# Patient Record
Sex: Female | Born: 1965 | Race: White | Hispanic: No | Marital: Married | Smoking: Current every day smoker
Health system: Southern US, Community
[De-identification: ages and names within clinical notes are randomized; demographics above are authoritative.]

## PROBLEM LIST (undated history)

## (undated) DIAGNOSIS — K7460 Unspecified cirrhosis of liver: Secondary | ICD-10-CM

## (undated) DIAGNOSIS — D649 Anemia, unspecified: Secondary | ICD-10-CM

## (undated) DIAGNOSIS — R51 Headache: Secondary | ICD-10-CM

## (undated) DIAGNOSIS — R569 Unspecified convulsions: Secondary | ICD-10-CM

## (undated) DIAGNOSIS — G40309 Generalized idiopathic epilepsy and epileptic syndromes, not intractable, without status epilepticus: Secondary | ICD-10-CM

## (undated) DIAGNOSIS — E119 Type 2 diabetes mellitus without complications: Secondary | ICD-10-CM

## (undated) DIAGNOSIS — J209 Acute bronchitis, unspecified: Secondary | ICD-10-CM

## (undated) DIAGNOSIS — E663 Overweight: Secondary | ICD-10-CM

## (undated) DIAGNOSIS — K219 Gastro-esophageal reflux disease without esophagitis: Secondary | ICD-10-CM

## (undated) DIAGNOSIS — R933 Abnormal findings on diagnostic imaging of other parts of digestive tract: Secondary | ICD-10-CM

## (undated) DIAGNOSIS — R740 Nonspecific elevation of levels of transaminase and lactic acid dehydrogenase [LDH]: Secondary | ICD-10-CM

## (undated) DIAGNOSIS — I10 Essential (primary) hypertension: Secondary | ICD-10-CM

## (undated) DIAGNOSIS — D61818 Other pancytopenia: Secondary | ICD-10-CM

## (undated) DIAGNOSIS — F331 Major depressive disorder, recurrent, moderate: Secondary | ICD-10-CM

## (undated) DIAGNOSIS — K449 Diaphragmatic hernia without obstruction or gangrene: Secondary | ICD-10-CM

## (undated) DIAGNOSIS — M545 Low back pain: Secondary | ICD-10-CM

## (undated) DIAGNOSIS — D696 Thrombocytopenia, unspecified: Principal | ICD-10-CM

## (undated) HISTORY — DX: Type 2 diabetes mellitus without complications: E11.9

## (undated) HISTORY — DX: Unspecified convulsions: R56.9

## (undated) HISTORY — DX: Gastro-esophageal reflux disease without esophagitis: K21.9

## (undated) HISTORY — DX: Overweight: E66.3

## (undated) HISTORY — DX: Other pancytopenia: D61.818

## (undated) HISTORY — DX: Nonspecific elevation of levels of transaminase and lactic acid dehydrogenase (ldh): R74.0

## (undated) HISTORY — PX: LUMBAR LAMINECTOMY: SHX95

## (undated) HISTORY — DX: Unspecified cirrhosis of liver: K74.60

## (undated) HISTORY — DX: Thrombocytopenia, unspecified: D69.6

## (undated) HISTORY — DX: Abnormal findings on diagnostic imaging of other parts of digestive tract: R93.3

## (undated) HISTORY — DX: Low back pain: M54.5

## (undated) HISTORY — DX: Headache: R51

## (undated) HISTORY — DX: Essential (primary) hypertension: I10

## (undated) HISTORY — PX: TUBAL LIGATION: SHX77

## (undated) HISTORY — DX: Diaphragmatic hernia without obstruction or gangrene: K44.9

## (undated) HISTORY — DX: Anemia, unspecified: D64.9

## (undated) HISTORY — DX: Generalized idiopathic epilepsy and epileptic syndromes, not intractable, without status epilepticus: G40.309

## (undated) HISTORY — PX: CARPAL TUNNEL RELEASE: SHX101

## (undated) HISTORY — DX: Acute bronchitis, unspecified: J20.9

## (undated) HISTORY — DX: Major depressive disorder, recurrent, moderate: F33.1

## (undated) SURGERY — EGD (ESOPHAGOGASTRODUODENOSCOPY)
Anesthesia: Moderate Sedation | Laterality: Left

---

## 1998-08-26 ENCOUNTER — Emergency Department (HOSPITAL_COMMUNITY): Admission: EM | Admit: 1998-08-26 | Discharge: 1998-08-26 | Payer: Self-pay | Admitting: Emergency Medicine

## 1999-02-13 ENCOUNTER — Other Ambulatory Visit: Admission: RE | Admit: 1999-02-13 | Discharge: 1999-02-13 | Payer: Self-pay | Admitting: Obstetrics and Gynecology

## 2001-02-03 ENCOUNTER — Emergency Department (HOSPITAL_COMMUNITY): Admission: EM | Admit: 2001-02-03 | Discharge: 2001-02-03 | Payer: Self-pay | Admitting: *Deleted

## 2001-05-10 ENCOUNTER — Emergency Department (HOSPITAL_COMMUNITY): Admission: EM | Admit: 2001-05-10 | Discharge: 2001-05-10 | Payer: Self-pay | Admitting: *Deleted

## 2001-05-10 ENCOUNTER — Encounter: Payer: Self-pay | Admitting: *Deleted

## 2001-07-23 ENCOUNTER — Emergency Department (HOSPITAL_COMMUNITY): Admission: EM | Admit: 2001-07-23 | Discharge: 2001-07-23 | Payer: Self-pay | Admitting: *Deleted

## 2001-08-25 ENCOUNTER — Emergency Department (HOSPITAL_COMMUNITY): Admission: EM | Admit: 2001-08-25 | Discharge: 2001-08-25 | Payer: Self-pay | Admitting: *Deleted

## 2001-08-25 ENCOUNTER — Encounter: Payer: Self-pay | Admitting: *Deleted

## 2001-10-05 ENCOUNTER — Emergency Department (HOSPITAL_COMMUNITY): Admission: EM | Admit: 2001-10-05 | Discharge: 2001-10-05 | Payer: Self-pay | Admitting: Emergency Medicine

## 2001-10-09 ENCOUNTER — Emergency Department (HOSPITAL_COMMUNITY): Admission: EM | Admit: 2001-10-09 | Discharge: 2001-10-09 | Payer: Self-pay | Admitting: *Deleted

## 2002-03-18 ENCOUNTER — Emergency Department (HOSPITAL_COMMUNITY): Admission: EM | Admit: 2002-03-18 | Discharge: 2002-03-18 | Payer: Self-pay | Admitting: Emergency Medicine

## 2002-03-23 ENCOUNTER — Emergency Department (HOSPITAL_COMMUNITY): Admission: EM | Admit: 2002-03-23 | Discharge: 2002-03-23 | Payer: Self-pay | Admitting: Emergency Medicine

## 2002-06-19 ENCOUNTER — Emergency Department (HOSPITAL_COMMUNITY): Admission: EM | Admit: 2002-06-19 | Discharge: 2002-06-19 | Payer: Self-pay | Admitting: Emergency Medicine

## 2002-12-06 ENCOUNTER — Emergency Department (HOSPITAL_COMMUNITY): Admission: EM | Admit: 2002-12-06 | Discharge: 2002-12-06 | Payer: Self-pay | Admitting: Emergency Medicine

## 2003-08-16 ENCOUNTER — Emergency Department (HOSPITAL_COMMUNITY): Admission: EM | Admit: 2003-08-16 | Discharge: 2003-08-16 | Payer: Self-pay | Admitting: Emergency Medicine

## 2003-08-23 ENCOUNTER — Emergency Department (HOSPITAL_COMMUNITY): Admission: EM | Admit: 2003-08-23 | Discharge: 2003-08-23 | Payer: Self-pay | Admitting: Emergency Medicine

## 2003-09-06 ENCOUNTER — Emergency Department (HOSPITAL_COMMUNITY): Admission: AD | Admit: 2003-09-06 | Discharge: 2003-09-06 | Payer: Self-pay | Admitting: Family Medicine

## 2003-09-15 ENCOUNTER — Emergency Department (HOSPITAL_COMMUNITY): Admission: EM | Admit: 2003-09-15 | Discharge: 2003-09-15 | Payer: Self-pay | Admitting: Emergency Medicine

## 2003-09-30 ENCOUNTER — Emergency Department (HOSPITAL_COMMUNITY): Admission: AD | Admit: 2003-09-30 | Discharge: 2003-09-30 | Payer: Self-pay | Admitting: Emergency Medicine

## 2004-10-09 ENCOUNTER — Emergency Department (HOSPITAL_COMMUNITY): Admission: EM | Admit: 2004-10-09 | Discharge: 2004-10-09 | Payer: Self-pay | Admitting: Family Medicine

## 2004-10-16 ENCOUNTER — Ambulatory Visit: Payer: Self-pay | Admitting: Family Medicine

## 2004-11-01 ENCOUNTER — Ambulatory Visit: Payer: Self-pay | Admitting: Family Medicine

## 2005-03-19 ENCOUNTER — Ambulatory Visit: Payer: Self-pay | Admitting: Family Medicine

## 2005-04-22 ENCOUNTER — Ambulatory Visit: Payer: Self-pay | Admitting: Family Medicine

## 2005-08-18 ENCOUNTER — Emergency Department (HOSPITAL_COMMUNITY): Admission: EM | Admit: 2005-08-18 | Discharge: 2005-08-18 | Payer: Self-pay | Admitting: Family Medicine

## 2005-09-02 ENCOUNTER — Emergency Department (HOSPITAL_COMMUNITY): Admission: EM | Admit: 2005-09-02 | Discharge: 2005-09-02 | Payer: Self-pay | Admitting: Family Medicine

## 2005-09-24 ENCOUNTER — Emergency Department (HOSPITAL_COMMUNITY): Admission: EM | Admit: 2005-09-24 | Discharge: 2005-09-24 | Payer: Self-pay | Admitting: Emergency Medicine

## 2005-10-05 ENCOUNTER — Emergency Department (HOSPITAL_COMMUNITY): Admission: EM | Admit: 2005-10-05 | Discharge: 2005-10-05 | Payer: Self-pay | Admitting: Emergency Medicine

## 2006-05-26 ENCOUNTER — Ambulatory Visit: Payer: Self-pay | Admitting: Family Medicine

## 2006-06-02 ENCOUNTER — Ambulatory Visit: Payer: Self-pay | Admitting: Family Medicine

## 2006-06-04 ENCOUNTER — Ambulatory Visit: Payer: Self-pay | Admitting: Gastroenterology

## 2006-06-16 ENCOUNTER — Emergency Department (HOSPITAL_COMMUNITY): Admission: EM | Admit: 2006-06-16 | Discharge: 2006-06-16 | Payer: Self-pay | Admitting: Family Medicine

## 2006-09-10 ENCOUNTER — Emergency Department (HOSPITAL_COMMUNITY): Admission: EM | Admit: 2006-09-10 | Discharge: 2006-09-10 | Payer: Self-pay | Admitting: Family Medicine

## 2007-01-18 ENCOUNTER — Emergency Department (HOSPITAL_COMMUNITY): Admission: EM | Admit: 2007-01-18 | Discharge: 2007-01-18 | Payer: Self-pay | Admitting: Emergency Medicine

## 2007-01-19 ENCOUNTER — Ambulatory Visit: Payer: Self-pay | Admitting: Psychiatry

## 2007-01-19 ENCOUNTER — Other Ambulatory Visit (HOSPITAL_COMMUNITY): Admission: RE | Admit: 2007-01-19 | Discharge: 2007-04-19 | Payer: Self-pay | Admitting: Psychiatry

## 2007-02-03 ENCOUNTER — Ambulatory Visit (HOSPITAL_COMMUNITY): Payer: Self-pay | Admitting: Psychiatry

## 2007-02-15 ENCOUNTER — Ambulatory Visit (HOSPITAL_COMMUNITY): Payer: Self-pay | Admitting: Psychiatry

## 2007-03-10 ENCOUNTER — Ambulatory Visit (HOSPITAL_COMMUNITY): Payer: Self-pay | Admitting: Psychiatry

## 2007-07-09 ENCOUNTER — Ambulatory Visit: Payer: Self-pay | Admitting: Family Medicine

## 2007-07-09 DIAGNOSIS — E119 Type 2 diabetes mellitus without complications: Secondary | ICD-10-CM

## 2007-07-09 DIAGNOSIS — F331 Major depressive disorder, recurrent, moderate: Secondary | ICD-10-CM

## 2007-07-09 DIAGNOSIS — I10 Essential (primary) hypertension: Secondary | ICD-10-CM

## 2007-07-09 HISTORY — DX: Essential (primary) hypertension: I10

## 2007-07-09 HISTORY — DX: Type 2 diabetes mellitus without complications: E11.9

## 2007-07-09 HISTORY — DX: Major depressive disorder, recurrent, moderate: F33.1

## 2007-07-15 LAB — CONVERTED CEMR LAB
ALT: 56 units/L — ABNORMAL HIGH (ref 0–35)
Alkaline Phosphatase: 159 units/L — ABNORMAL HIGH (ref 39–117)
BUN: 4 mg/dL — ABNORMAL LOW (ref 6–23)
Basophils Relative: 0.9 % (ref 0.0–1.0)
CO2: 27 meq/L (ref 19–32)
Calcium: 9 mg/dL (ref 8.4–10.5)
Cholesterol: 220 mg/dL (ref 0–200)
Creatinine, Ser: 0.5 mg/dL (ref 0.4–1.2)
Hemoglobin: 13.9 g/dL (ref 12.0–15.0)
Monocytes Absolute: 0.5 10*3/uL (ref 0.2–0.7)
Monocytes Relative: 6.2 % (ref 3.0–11.0)
Platelets: 115 10*3/uL — ABNORMAL LOW (ref 150–400)
RDW: 14.7 % — ABNORMAL HIGH (ref 11.5–14.6)
Total Bilirubin: 1.2 mg/dL (ref 0.3–1.2)
Total CHOL/HDL Ratio: 3.4
Total Protein: 7.1 g/dL (ref 6.0–8.3)
VLDL: 14 mg/dL (ref 0–40)
WBC: 8 10*3/uL (ref 4.5–10.5)

## 2007-07-28 ENCOUNTER — Telehealth (INDEPENDENT_AMBULATORY_CARE_PROVIDER_SITE_OTHER): Payer: Self-pay | Admitting: *Deleted

## 2007-09-30 ENCOUNTER — Telehealth: Payer: Self-pay | Admitting: Family Medicine

## 2007-10-01 ENCOUNTER — Ambulatory Visit: Payer: Self-pay | Admitting: Family Medicine

## 2007-10-01 DIAGNOSIS — E663 Overweight: Secondary | ICD-10-CM | POA: Insufficient documentation

## 2007-10-01 DIAGNOSIS — L723 Sebaceous cyst: Secondary | ICD-10-CM

## 2007-10-01 HISTORY — DX: Overweight: E66.3

## 2007-10-04 ENCOUNTER — Telehealth: Payer: Self-pay | Admitting: Family Medicine

## 2008-01-18 ENCOUNTER — Telehealth: Payer: Self-pay | Admitting: Family Medicine

## 2008-02-02 ENCOUNTER — Telehealth: Payer: Self-pay | Admitting: Family Medicine

## 2008-02-10 ENCOUNTER — Emergency Department (HOSPITAL_COMMUNITY): Admission: EM | Admit: 2008-02-10 | Discharge: 2008-02-10 | Payer: Self-pay | Admitting: Emergency Medicine

## 2008-04-23 ENCOUNTER — Emergency Department (HOSPITAL_COMMUNITY): Admission: EM | Admit: 2008-04-23 | Discharge: 2008-04-23 | Payer: Self-pay | Admitting: Family Medicine

## 2008-05-17 ENCOUNTER — Emergency Department (HOSPITAL_COMMUNITY): Admission: EM | Admit: 2008-05-17 | Discharge: 2008-05-17 | Payer: Self-pay | Admitting: Emergency Medicine

## 2008-07-21 ENCOUNTER — Ambulatory Visit: Payer: Self-pay | Admitting: Family Medicine

## 2008-07-21 DIAGNOSIS — J209 Acute bronchitis, unspecified: Secondary | ICD-10-CM

## 2008-07-21 HISTORY — DX: Acute bronchitis, unspecified: J20.9

## 2008-07-25 ENCOUNTER — Telehealth: Payer: Self-pay | Admitting: Family Medicine

## 2008-07-25 LAB — CONVERTED CEMR LAB
ALT: 62 units/L — ABNORMAL HIGH (ref 0–35)
AST: 81 units/L — ABNORMAL HIGH (ref 0–37)
Basophils Absolute: 0.2 10*3/uL — ABNORMAL HIGH (ref 0.0–0.1)
Basophils Relative: 1 % (ref 0.0–3.0)
CO2: 33 meq/L — ABNORMAL HIGH (ref 19–32)
Chloride: 97 meq/L (ref 96–112)
Creatinine, Ser: 0.6 mg/dL (ref 0.4–1.2)
Eosinophils Relative: 3.9 % (ref 0.0–5.0)
HDL: 44.1 mg/dL (ref >39.0)
Hgb A1c MFr Bld: 5.7 % (ref 4.6–6.0)
Lymphocytes Relative: 26.3 % (ref 12.0–46.0)
MCHC: 35 g/dL (ref 30.0–36.0)
Neutrophils Relative %: 59.6 % (ref 43.0–77.0)
RBC: 4.12 M/uL (ref 3.87–5.11)
Total Bilirubin: 1.4 mg/dL — ABNORMAL HIGH (ref 0.3–1.2)
VLDL: 31 mg/dL (ref 0–40)
WBC: 5.6 10*3/uL (ref 4.5–10.5)

## 2008-07-26 ENCOUNTER — Telehealth: Payer: Self-pay | Admitting: Family Medicine

## 2008-07-27 ENCOUNTER — Ambulatory Visit: Payer: Self-pay | Admitting: Cardiovascular Disease

## 2008-07-31 ENCOUNTER — Telehealth: Payer: Self-pay | Admitting: Family Medicine

## 2008-08-03 ENCOUNTER — Encounter: Admission: RE | Admit: 2008-08-03 | Discharge: 2008-08-03 | Payer: Self-pay | Admitting: Family Medicine

## 2008-08-03 ENCOUNTER — Ambulatory Visit: Payer: Self-pay | Admitting: Oncology

## 2008-08-10 ENCOUNTER — Encounter: Payer: Self-pay | Admitting: Family Medicine

## 2008-08-10 LAB — CBC WITH DIFFERENTIAL/PLATELET
BASO%: 0.1 % (ref 0.0–2.0)
EOS%: 3.1 % (ref 0.0–7.0)
HCT: 38.9 % (ref 34.8–46.6)
LYMPH%: 20.3 % (ref 14.0–48.0)
MCH: 33.8 pg (ref 26.0–34.0)
MCHC: 34.6 g/dL (ref 32.0–36.0)
MONO#: 0.3 10*3/uL (ref 0.1–0.9)
NEUT%: 70.6 % (ref 39.6–76.8)
RBC: 3.98 10*6/uL (ref 3.70–5.32)
WBC: 6 10*3/uL (ref 3.9–10.0)
lymph#: 1.2 10*3/uL (ref 0.9–3.3)

## 2008-08-10 LAB — CHCC SMEAR

## 2008-08-11 DIAGNOSIS — K7469 Other cirrhosis of liver: Secondary | ICD-10-CM

## 2008-08-11 DIAGNOSIS — K7460 Unspecified cirrhosis of liver: Secondary | ICD-10-CM

## 2008-08-11 HISTORY — DX: Unspecified cirrhosis of liver: K74.60

## 2008-08-11 LAB — COMPREHENSIVE METABOLIC PANEL
ALT: 55 U/L — ABNORMAL HIGH (ref 0–35)
AST: 66 U/L — ABNORMAL HIGH (ref 0–37)
Alkaline Phosphatase: 230 U/L — ABNORMAL HIGH (ref 39–117)
BUN: 7 mg/dL (ref 6–23)
Chloride: 103 mEq/L (ref 96–112)
Creatinine, Ser: 0.67 mg/dL (ref 0.40–1.20)
Potassium: 3.6 mEq/L (ref 3.5–5.3)

## 2008-08-11 LAB — HEPATITIS B SURFACE ANTIGEN: Hepatitis B Surface Ag: NEGATIVE

## 2008-08-11 LAB — HIV ANTIBODY (ROUTINE TESTING W REFLEX)

## 2008-08-11 LAB — HEPATITIS B CORE ANTIBODY, TOTAL: Hep B Core Total Ab: NEGATIVE

## 2008-08-11 LAB — AFP TUMOR MARKER: AFP-Tumor Marker: 6.7 ng/mL (ref 0.0–8.0)

## 2008-08-11 LAB — HEPATITIS C ANTIBODY: HCV Ab: NEGATIVE

## 2008-08-22 ENCOUNTER — Telehealth: Payer: Self-pay | Admitting: Family Medicine

## 2008-09-07 DIAGNOSIS — K219 Gastro-esophageal reflux disease without esophagitis: Secondary | ICD-10-CM

## 2008-09-07 DIAGNOSIS — K449 Diaphragmatic hernia without obstruction or gangrene: Secondary | ICD-10-CM | POA: Insufficient documentation

## 2008-09-07 HISTORY — DX: Diaphragmatic hernia without obstruction or gangrene: K44.9

## 2008-09-07 HISTORY — DX: Gastro-esophageal reflux disease without esophagitis: K21.9

## 2008-09-12 ENCOUNTER — Ambulatory Visit: Payer: Self-pay | Admitting: Internal Medicine

## 2008-09-12 DIAGNOSIS — R7401 Elevation of levels of liver transaminase levels: Secondary | ICD-10-CM

## 2008-09-12 DIAGNOSIS — R933 Abnormal findings on diagnostic imaging of other parts of digestive tract: Secondary | ICD-10-CM

## 2008-09-12 DIAGNOSIS — R7402 Elevation of levels of lactic acid dehydrogenase (LDH): Secondary | ICD-10-CM

## 2008-09-12 DIAGNOSIS — R740 Nonspecific elevation of levels of transaminase and lactic acid dehydrogenase [LDH]: Secondary | ICD-10-CM

## 2008-09-12 HISTORY — DX: Elevation of levels of liver transaminase levels: R74.01

## 2008-09-12 HISTORY — DX: Elevation of levels of lactic acid dehydrogenase (LDH): R74.02

## 2008-09-12 HISTORY — DX: Abnormal findings on diagnostic imaging of other parts of digestive tract: R93.3

## 2008-09-21 ENCOUNTER — Telehealth: Payer: Self-pay | Admitting: Family Medicine

## 2008-09-22 ENCOUNTER — Ambulatory Visit: Payer: Self-pay | Admitting: Family Medicine

## 2008-09-25 ENCOUNTER — Ambulatory Visit: Payer: Self-pay | Admitting: Internal Medicine

## 2008-09-25 LAB — CONVERTED CEMR LAB
A-1 Antitrypsin, Ser: 235 mg/dL — ABNORMAL HIGH (ref 83–200)
ALT: 53 units/L — ABNORMAL HIGH (ref 0–35)
Albumin: 3.5 g/dL (ref 3.5–5.2)
Anti Nuclear Antibody(ANA): NEGATIVE
BUN: 5 mg/dL — ABNORMAL LOW (ref 6–23)
Basophils Relative: 0.4 % (ref 0.0–3.0)
Calcium: 8.7 mg/dL (ref 8.4–10.5)
Ceruloplasmin: 45 mg/dL (ref 21–63)
Creatinine, Ser: 0.7 mg/dL (ref 0.4–1.2)
Eosinophils Relative: 3.6 % (ref 0.0–5.0)
GFR calc Af Amer: 118 mL/min
Glucose, Bld: 121 mg/dL — ABNORMAL HIGH (ref 70–99)
HCT: 40.9 % (ref 36.0–46.0)
Hemoglobin: 14.4 g/dL (ref 12.0–15.0)
INR: 1.1 — ABNORMAL HIGH (ref 0.8–1.0)
Monocytes Absolute: 0.4 10*3/uL (ref 0.1–1.0)
Monocytes Relative: 4.8 % (ref 3.0–12.0)
Neutro Abs: 5.2 10*3/uL (ref 1.4–7.7)
RBC: 4.13 M/uL (ref 3.87–5.11)
RDW: 14.8 % — ABNORMAL HIGH (ref 11.5–14.6)
Saturation Ratios: 58.1 % — ABNORMAL HIGH (ref 20.0–50.0)
Total Protein: 7.7 g/dL (ref 6.0–8.3)
Transferrin: 188 mg/dL — ABNORMAL LOW (ref >=212.0)
WBC: 7.6 10*3/uL (ref 4.5–10.5)

## 2008-10-10 ENCOUNTER — Telehealth: Payer: Self-pay | Admitting: Family Medicine

## 2008-10-17 ENCOUNTER — Ambulatory Visit: Payer: Self-pay | Admitting: Internal Medicine

## 2008-10-30 ENCOUNTER — Encounter (INDEPENDENT_AMBULATORY_CARE_PROVIDER_SITE_OTHER): Payer: Self-pay | Admitting: *Deleted

## 2008-11-07 ENCOUNTER — Ambulatory Visit: Payer: Self-pay | Admitting: Oncology

## 2008-11-21 ENCOUNTER — Emergency Department (HOSPITAL_COMMUNITY): Admission: EM | Admit: 2008-11-21 | Discharge: 2008-11-21 | Payer: Self-pay | Admitting: Family Medicine

## 2009-01-02 ENCOUNTER — Telehealth: Payer: Self-pay | Admitting: Family Medicine

## 2009-01-15 ENCOUNTER — Telehealth: Payer: Self-pay | Admitting: Family Medicine

## 2009-01-22 ENCOUNTER — Telehealth: Payer: Self-pay | Admitting: Family Medicine

## 2009-03-14 ENCOUNTER — Telehealth: Payer: Self-pay | Admitting: Family Medicine

## 2009-04-27 ENCOUNTER — Telehealth: Payer: Self-pay | Admitting: Family Medicine

## 2009-06-01 ENCOUNTER — Telehealth: Payer: Self-pay | Admitting: Family Medicine

## 2009-06-11 ENCOUNTER — Telehealth: Payer: Self-pay | Admitting: Family Medicine

## 2009-06-14 ENCOUNTER — Ambulatory Visit (HOSPITAL_COMMUNITY): Admission: RE | Admit: 2009-06-14 | Discharge: 2009-06-14 | Payer: Self-pay | Admitting: Chiropractic Medicine

## 2009-06-15 ENCOUNTER — Ambulatory Visit: Payer: Self-pay | Admitting: Family Medicine

## 2009-06-21 LAB — CONVERTED CEMR LAB
ALT: 49 units/L — ABNORMAL HIGH (ref 0–35)
AST: 67 units/L — ABNORMAL HIGH (ref 0–37)
Alkaline Phosphatase: 240 units/L — ABNORMAL HIGH (ref 39–117)
BUN: 7 mg/dL (ref 6–23)
Basophils Absolute: 0.1 10*3/uL (ref 0.0–0.1)
Bilirubin, Direct: 0.4 mg/dL — ABNORMAL HIGH (ref 0.0–0.3)
Calcium: 8.8 mg/dL (ref 8.4–10.5)
Creatinine,U: 23 mg/dL
Eosinophils Relative: 2 % (ref 0.0–5.0)
GFR calc non Af Amer: 96.89 mL/min (ref >60)
HCT: 39 % (ref 36.0–46.0)
Lymphocytes Relative: 25.3 % (ref 12.0–46.0)
Lymphs Abs: 1.5 10*3/uL (ref 0.7–4.0)
Microalb Creat Ratio: 21.7 mg/g (ref 0.0–30.0)
Monocytes Relative: 10.9 % (ref 3.0–12.0)
Platelets: 72 10*3/uL — ABNORMAL LOW (ref 150.0–400.0)
Potassium: 3.6 meq/L (ref 3.5–5.1)
RDW: 15 % — ABNORMAL HIGH (ref 11.5–14.6)
Sodium: 140 meq/L (ref 135–145)
TSH: 3.7 microintl units/mL (ref 0.35–5.50)
Total Bilirubin: 1.4 mg/dL — ABNORMAL HIGH (ref 0.3–1.2)
WBC: 5.9 10*3/uL (ref 4.5–10.5)

## 2009-08-02 ENCOUNTER — Telehealth: Payer: Self-pay | Admitting: Family Medicine

## 2009-08-22 ENCOUNTER — Encounter (INDEPENDENT_AMBULATORY_CARE_PROVIDER_SITE_OTHER): Payer: Self-pay | Admitting: *Deleted

## 2009-11-20 ENCOUNTER — Telehealth: Payer: Self-pay | Admitting: *Deleted

## 2009-12-13 ENCOUNTER — Telehealth: Payer: Self-pay | Admitting: Family Medicine

## 2010-01-14 ENCOUNTER — Telehealth: Payer: Self-pay | Admitting: Family Medicine

## 2010-04-18 ENCOUNTER — Telehealth: Payer: Self-pay | Admitting: Internal Medicine

## 2010-04-22 ENCOUNTER — Encounter: Payer: Self-pay | Admitting: Family Medicine

## 2010-05-03 ENCOUNTER — Observation Stay (HOSPITAL_COMMUNITY): Admission: RE | Admit: 2010-05-03 | Discharge: 2010-05-05 | Payer: Self-pay | Admitting: Orthopedic Surgery

## 2010-05-03 ENCOUNTER — Encounter (INDEPENDENT_AMBULATORY_CARE_PROVIDER_SITE_OTHER): Payer: Self-pay | Admitting: Orthopedic Surgery

## 2010-05-23 ENCOUNTER — Telehealth: Payer: Self-pay | Admitting: Family Medicine

## 2010-07-02 ENCOUNTER — Ambulatory Visit: Payer: Self-pay | Admitting: Family Medicine

## 2010-07-02 DIAGNOSIS — M545 Low back pain, unspecified: Secondary | ICD-10-CM

## 2010-07-02 HISTORY — DX: Low back pain, unspecified: M54.50

## 2010-09-24 ENCOUNTER — Ambulatory Visit: Payer: Self-pay | Admitting: Family Medicine

## 2010-09-24 DIAGNOSIS — J019 Acute sinusitis, unspecified: Secondary | ICD-10-CM | POA: Insufficient documentation

## 2010-09-24 DIAGNOSIS — R51 Headache: Secondary | ICD-10-CM | POA: Insufficient documentation

## 2010-09-24 DIAGNOSIS — R519 Headache, unspecified: Secondary | ICD-10-CM | POA: Insufficient documentation

## 2010-09-24 HISTORY — DX: Headache: R51

## 2010-09-26 ENCOUNTER — Telehealth: Payer: Self-pay | Admitting: Family Medicine

## 2010-10-10 ENCOUNTER — Ambulatory Visit: Payer: Self-pay | Admitting: Family Medicine

## 2010-10-10 ENCOUNTER — Telehealth: Payer: Self-pay | Admitting: *Deleted

## 2010-10-10 DIAGNOSIS — G40309 Generalized idiopathic epilepsy and epileptic syndromes, not intractable, without status epilepticus: Secondary | ICD-10-CM | POA: Insufficient documentation

## 2010-10-10 DIAGNOSIS — A088 Other specified intestinal infections: Secondary | ICD-10-CM

## 2010-10-10 HISTORY — DX: Generalized idiopathic epilepsy and epileptic syndromes, not intractable, without status epilepticus: G40.309

## 2010-10-11 ENCOUNTER — Telehealth: Payer: Self-pay | Admitting: Family Medicine

## 2010-10-14 ENCOUNTER — Ambulatory Visit: Payer: Self-pay | Admitting: Family Medicine

## 2010-10-14 LAB — CONVERTED CEMR LAB
AST: 89 units/L — ABNORMAL HIGH (ref 0–37)
BUN: 9 mg/dL (ref 6–23)
Basophils Absolute: 0 10*3/uL (ref 0.0–0.1)
Bilirubin Urine: NEGATIVE
Bilirubin, Direct: 1 mg/dL — ABNORMAL HIGH (ref 0.0–0.3)
Calcium: 7.5 mg/dL — ABNORMAL LOW (ref 8.4–10.5)
Cholesterol: 204 mg/dL — ABNORMAL HIGH (ref 0–200)
Creatinine, Ser: 0.8 mg/dL (ref 0.4–1.2)
Eosinophils Relative: 1.1 % (ref 0.0–5.0)
GFR calc non Af Amer: 86.27 mL/min (ref >60)
Glucose, Bld: 107 mg/dL — ABNORMAL HIGH (ref 70–99)
Glucose, Urine, Semiquant: NEGATIVE
HCT: 39.1 % (ref 36.0–46.0)
HDL: 52.5 mg/dL (ref >39.00)
Hgb A1c MFr Bld: 5.3 % (ref 4.6–6.5)
Ketones, urine, test strip: NEGATIVE
Lymphs Abs: 1.6 10*3/uL (ref 0.7–4.0)
Monocytes Relative: 7.6 % (ref 3.0–12.0)
Neutrophils Relative %: 73.4 % (ref 43.0–77.0)
Platelets: 58 10*3/uL — ABNORMAL LOW (ref 150.0–400.0)
Potassium: 2.6 meq/L — CL (ref 3.5–5.1)
RDW: 14.5 % (ref 11.5–14.6)
TSH: 1.62 microintl units/mL (ref 0.35–5.50)
Total Bilirubin: 3.3 mg/dL — ABNORMAL HIGH (ref 0.3–1.2)
Triglycerides: 98 mg/dL (ref 0.0–149.0)
VLDL: 19.6 mg/dL (ref 0.0–40.0)
WBC: 8.8 10*3/uL (ref 4.5–10.5)
pH: 5

## 2010-10-16 LAB — CONVERTED CEMR LAB
Alkaline Phosphatase: 223 units/L — ABNORMAL HIGH (ref 39–117)
Basophils Absolute: 0 10*3/uL (ref 0.0–0.1)
Bilirubin, Direct: 0.7 mg/dL — ABNORMAL HIGH (ref 0.0–0.3)
Calcium: 8.7 mg/dL (ref 8.4–10.5)
Eosinophils Absolute: 0.3 10*3/uL (ref 0.0–0.7)
GFR calc non Af Amer: 103.06 mL/min (ref >60)
HCT: 41.2 % (ref 36.0–46.0)
Hemoglobin: 14.2 g/dL (ref 12.0–15.0)
Lymphs Abs: 1.3 10*3/uL (ref 0.7–4.0)
MCHC: 34.5 g/dL (ref 30.0–36.0)
MCV: 100.2 fL — ABNORMAL HIGH (ref 78.0–100.0)
Monocytes Absolute: 0.5 10*3/uL (ref 0.1–1.0)
Monocytes Relative: 7.1 % (ref 3.0–12.0)
Neutro Abs: 4.4 10*3/uL (ref 1.4–7.7)
Potassium: 3.2 meq/L — ABNORMAL LOW (ref 3.5–5.1)
RDW: 14.4 % (ref 11.5–14.6)
Sodium: 135 meq/L (ref 135–145)
Total Bilirubin: 2.1 mg/dL — ABNORMAL HIGH (ref 0.3–1.2)

## 2010-10-17 ENCOUNTER — Telehealth: Payer: Self-pay | Admitting: Family Medicine

## 2010-10-23 ENCOUNTER — Telehealth: Payer: Self-pay | Admitting: Family Medicine

## 2010-10-28 ENCOUNTER — Telehealth: Payer: Self-pay | Admitting: Family Medicine

## 2010-10-29 ENCOUNTER — Ambulatory Visit: Payer: Self-pay | Admitting: Family Medicine

## 2010-11-15 ENCOUNTER — Telehealth: Payer: Self-pay | Admitting: Family Medicine

## 2010-11-27 ENCOUNTER — Telehealth: Payer: Self-pay | Admitting: Family Medicine

## 2010-12-23 ENCOUNTER — Encounter: Payer: Self-pay | Admitting: Family Medicine

## 2010-12-25 ENCOUNTER — Telehealth: Payer: Self-pay | Admitting: Family Medicine

## 2011-01-02 NOTE — Assessment & Plan Note (Signed)
Summary: reck labs /gh/rsc per Gina/cjr   Vital Signs:  Patient profile:   45 year old female Height:      64 inches (162.56 cm) Weight:      217.31 pounds (98.78 kg) BMI:     37.44 O2 Sat:      98 % on Room air Temp:     99.1 degrees F (37.28 degrees C) oral Pulse rate:   117 / minute BP sitting:   120 / 80  (left arm) Cuff size:   large  Vitals Entered By: Josph Macho RMA (October 14, 2010 3:50 PM)  O2 Flow:  Room air CC: Recheck on labs/ CF Is Patient Diabetic? Yes   History of Present Illness: Here to follow up from a gastroeneteritis last week and to check some abnormal labs. We saw her last Thursday for 3 straight days of vomiting. We gave her Phenergan, and she responded well. The vomiting stopped that night, and she has had no nausea at all for the past 3 days. She is eating and drinking normally now, and she feels better. She has more strength, but her muscles are all still sore. Her labs last week showed a WBC of 8.8, with a normal HGB and low platelets at 58 K. Her liver enzymes were all mildly elevated. She does have cirrhosis, but her enzymes were all a little higher than her baseline a year ago. Total bilirubin was 3.3, AST and ALT were 89 and 49. He rpotassium was low at 2.6. She was told to increase her potassium pills to a total of 40 mEq a day, and she has done so. Her calcium was also low at 7.5. Kidney function was normal. her A1c was excellent at 5.3.   Current Medications (verified): 1)  Clonidine Hcl 0.1 Mg Tabs (Clonidine Hcl) .... Two Times A Day 2)  Trimethoprim 100 Mg Tabs (Trimethoprim) .Marland Kitchen.. 1 By Mouth Two Times A Day 3)  Prozac 40 Mg  Caps (Fluoxetine Hcl) .Marland Kitchen.. 1 By Mouth Two Times A Day 4)  Alprazolam 2 Mg Tabs (Alprazolam) .Marland Kitchen.. 1 Every 6 Hours 5)  Klor-Con M20 20 Meq Cr-Tabs (Potassium Chloride Crys Cr) .... 1/2 By Mouth Once Daily 6)  Accu-Chek Comfort Curve  Strp (Glucose Blood) .Marland Kitchen.. 1 By Mouth Once Daily 7)  Metformin Hcl 1000 Mg Tabs (Metformin  Hcl) .... Two Times A Day 8)  Onglyza 5 Mg Tabs (Saxagliptin Hcl) .... Once Daily 9)  Furosemide 40 Mg Tabs (Furosemide) .... 2 Tabs Two Times A Day 10)  Spironolactone 100 Mg Tabs (Spironolactone) .... Once Daily 11)  Hydromet 5-1.5 Mg/80ml Syrp (Hydrocodone-Homatropine) .Marland Kitchen.. 1 Tsp Q 4 Hours As Needed Cough 12)  Vicoprofen 7.5-200 Mg Tabs (Hydrocodone-Ibuprofen) .Marland Kitchen.. 1 Q 6 Hours As Needed Pain 13)  Promethazine Hcl 25 Mg Tabs (Promethazine Hcl) .Marland Kitchen.. 1 Q 4 Hours As Needed Nausea  Allergies (verified): No Known Drug Allergies  Past History:  Past Medical History: Reviewed history from 09/24/2010 and no changes required. Diabetes mellitus, type II Hypertension Alcoholism Anxiety Disorder Chronic Headaches Depression GERD Hyperlipidemia Urinary Tract Infection Low back pain Cirrhosis  Past Surgical History: Reviewed history from 07/02/2010 and no changes required. Tubal ligation Carpal tunnel release (left) Lumbar laminectomy at L5-S1 on 06-04-10 per Dr. Leslee Home  Review of Systems  The patient denies anorexia, fever, weight loss, weight gain, vision loss, decreased hearing, hoarseness, chest pain, syncope, dyspnea on exertion, peripheral edema, prolonged cough, headaches, hemoptysis, abdominal pain, melena, hematochezia, severe indigestion/heartburn, hematuria, incontinence, genital sores,  muscle weakness, suspicious skin lesions, transient blindness, difficulty walking, depression, unusual weight change, abnormal bleeding, enlarged lymph nodes, angioedema, breast masses, and testicular masses.    Physical Exam  General:  Well-developed,well-nourished,in no acute distress; alert,appropriate and cooperative throughout examination Neck:  No deformities, masses, or tenderness noted. Lungs:  Normal respiratory effort, chest expands symmetrically. Lungs are clear to auscultation, no crackles or wheezes. Heart:  Normal rate and regular rhythm. S1 and S2 normal without gallop, murmur,  click, rub or other extra sounds. Abdomen:  Bowel sounds positive,abdomen soft and non-tender without masses, organomegaly or hernias noted.   Impression & Recommendations:  Problem # 1:  GASTROENTERITIS, VIRAL (ICD-008.8)  Orders: Venipuncture (81191) TLB-BMP (Basic Metabolic Panel-BMET) (80048-METABOL) TLB-Hepatic/Liver Function Pnl (80076-HEPATIC) TLB-CBC Platelet - w/Differential (85025-CBCD)  Problem # 2:  CIRRHOSIS (ICD-571.5)  Her updated medication list for this problem includes:    Furosemide 40 Mg Tabs (Furosemide) .Marland Kitchen... 2 tabs two times a day    Spironolactone 100 Mg Tabs (Spironolactone) ..... Once daily  Problem # 3:  HYPERTENSION (ICD-401.9)  Her updated medication list for this problem includes:    Clonidine Hcl 0.1 Mg Tabs (Clonidine hcl) .Marland Kitchen..Marland Kitchen Two times a day    Furosemide 40 Mg Tabs (Furosemide) .Marland Kitchen... 2 tabs two times a day    Spironolactone 100 Mg Tabs (Spironolactone) ..... Once daily  Problem # 4:  DIABETES MELLITUS, TYPE II (ICD-250.00)  Her updated medication list for this problem includes:    Metformin Hcl 1000 Mg Tabs (Metformin hcl) .Marland Kitchen..Marland Kitchen Two times a day    Onglyza 5 Mg Tabs (Saxagliptin hcl) ..... Once daily  Complete Medication List: 1)  Clonidine Hcl 0.1 Mg Tabs (Clonidine hcl) .... Two times a day 2)  Trimethoprim 100 Mg Tabs (Trimethoprim) .Marland Kitchen.. 1 by mouth two times a day 3)  Prozac 40 Mg Caps (Fluoxetine hcl) .Marland Kitchen.. 1 by mouth two times a day 4)  Alprazolam 2 Mg Tabs (Alprazolam) .Marland Kitchen.. 1 every 6 hours 5)  Klor-con M20 20 Meq Cr-tabs (Potassium chloride crys cr) .... Two times a day 6)  Accu-chek Comfort Curve Strp (Glucose blood) .Marland Kitchen.. 1 by mouth once daily 7)  Metformin Hcl 1000 Mg Tabs (Metformin hcl) .... Two times a day 8)  Onglyza 5 Mg Tabs (Saxagliptin hcl) .... Once daily 9)  Furosemide 40 Mg Tabs (Furosemide) .... 2 tabs two times a day 10)  Spironolactone 100 Mg Tabs (Spironolactone) .... Once daily 11)  Hydromet 5-1.5 Mg/79ml Syrp  (Hydrocodone-homatropine) .Marland Kitchen.. 1 tsp q 4 hours as needed cough 12)  Vicoprofen 7.5-200 Mg Tabs (Hydrocodone-ibuprofen) .Marland Kitchen.. 1 q 6 hours as needed pain 13)  Promethazine Hcl 25 Mg Tabs (Promethazine hcl) .Marland Kitchen.. 1 q 4 hours as needed nausea  Other Orders: Ketorolac-Toradol 15mg  (Y7829) Admin of Therapeutic Inj  intramuscular or subcutaneous (56213)  Patient Instructions: 1)  get repeat labs today  Prescriptions: KLOR-CON M20 20 MEQ CR-TABS (POTASSIUM CHLORIDE CRYS CR) two times a day  #60 x 11   Entered and Authorized by:   [name] MD   Signed by:   [name] MD on 10/14/2010   Method used:   Electronically to        [facility] [address] [phone]* (retail)       [address]       [place], [place]  [address]       Ph: [phone] or [phone]       Fax:  [phone]   RxID:   [id number]    Medication Administration  Injection # 1:    Medication: Ketorolac-Toradol 15mg     Diagnosis: HEADACHE (ICD-784.0)    Route: IM    Site: LUOQ gluteus    Exp Date: 01/30/2012    Lot #: [id number]    Mfr: [email or web address]    Comments: 60 mg given     Patient tolerated injection without complications    Given by: Pura Spice, RN (October 14, 2010 5:31 PM)  Orders Added: 1)  Venipuncture [52841] 2)  TLB-BMP (Basic Metabolic Panel-BMET) [80048-METABOL] 3)  TLB-Hepatic/Liver Function Pnl [80076-HEPATIC] 4)  TLB-CBC Platelet - w/Differential [85025-CBCD] 5)  Ketorolac-Toradol 15mg  [J1885] 6)  Admin of Therapeutic Inj  intramuscular or subcutaneous [96372] 7)  Est. Patient Level IV [32440]    Laboratory Results   Urine Tests    Routine Urinalysis   Color: straw Appearance: Clear Glucose: negative   (Normal Range: Negative) Bilirubin: negative   (Normal Range: Negative) Ketone: negative   (Normal Range: Negative) Spec. Gravity: 1.010   (Normal Range: 1.003-1.035) Blood: negative   (Normal Range: Negative) pH: 5.0   (Normal Range: 5.0-8.0) Protein:  negative   (Normal Range: Negative) Urobilinogen: 0.2   (Normal Range: 0-1) Nitrite: negative   (Normal Range: Negative) Leukocyte Esterace: negative   (Normal Range: Negative)

## 2011-01-02 NOTE — Progress Notes (Signed)
Summary: Pt req script for Oxycodone on 12/29/10. Call in other meds  Phone Note Refill Request Call back at Home Phone [phone]   Refills Requested: Medication #1:  OXYCODONE HCL 10 MG TABS 1 q 6 hours as needed pain.   Dosage confirmed as above?Dosage Confirmed  Medication #2:  ALPRAZOLAM 2 MG TABS 1 every 6 hours   Dosage confirmed as above?Dosage Confirmed  Medication #3:  KLOR-CON M20 20 MEQ CR-TABS two times a day   Dosage confirmed as above?Dosage Confirmed   Dosage confirmed as above?Dosage Confirmed Pt will come and pick up script for Oxycodone on 12/29/10.  Pls call in other meds to [facility] on [address] [facility].   Initial call taken by: Lucy Antigua,  December 25, 2010 11:39 AM  Follow-up for Phone Call        oxycodone was written. call in potassium #60 with 11 rf, also Alprazolam #120 with 5 rf Follow-up by: [name] MD,  December 25, 2010 4:23 PM  Additional Follow-up for Phone Call Additional follow up Details #1::        pt aware  other meds called  Additional Follow-up by: Pura Spice, RN,  December 26, 2010 9:41 AM    New/Updated Medications: KLOR-CON M20 20 MEQ CR-TABS (POTASSIUM CHLORIDE CRYS CR) two times a day OXYCODONE HCL 10 MG TABS (OXYCODONE HCL) 1 q 6 hours as needed pain Prescriptions: KLOR-CON M20 20 MEQ CR-TABS (POTASSIUM CHLORIDE CRYS CR) two times a day  #60 x 11   Entered by:   Pura Spice, RN   Authorized by:   [name] MD   Signed by:   Pura Spice, RN on 12/26/2010   Method used:   Telephoned to ...       [facility] [address] [phone]* (retail)       [address]       [place], [place]  [address]       Ph: [phone] or [phone]       Fax: [phone]   RxID:   [id number] ALPRAZOLAM 2 MG TABS (ALPRAZOLAM) 1 every 6 hours  #120 x 5   Entered by:   Pura Spice, RN   Authorized by:   [name] MD   Signed by:   Pura Spice, RN on 12/26/2010   Method used:   Telephoned to ...  [facility] [address] [phone]* (retail)       [address]       [place], [place]  [address]       Ph: [phone] or [phone]       Fax: [phone]   RxID:   [id number] OXYCODONE HCL 10 MG TABS (OXYCODONE HCL) 1 q 6 hours as needed pain  #120 x 0   Entered and Authorized by:   [name] MD   Signed by:   [name] MD on 12/25/2010   Method used:   Print then Give to Patient   RxID:   [id number]

## 2011-01-02 NOTE — Progress Notes (Signed)
Summary: needs refill  Phone Note Call from Patient Call back at Home Phone [phone]   Caller: Patient Call For: [name] MD Summary of Call: pt was seen on 10.25-11. She dropped bottle of cough syrup wasting half on it. Pt is requesting refill call [facility] [address] [phone] Initial call taken by: Heron Sabins,  September 26, 2010 9:06 AM  Follow-up for Phone Call        call in another 240 ml bottle of Hydromet, no rf Follow-up by: [name] MD,  September 26, 2010 9:22 AM  Additional Follow-up for Phone Call Additional follow up Details #1::        done  Additional Follow-up by: Pura Spice, RN,  September 26, 2010 10:26 AM    Prescriptions: Heath Lark 5-1.5 MG/5ML SYRP (HYDROCODONE-HOMATROPINE) 1 tsp q 4 hours as needed cough  #240 x 0   Entered by:   Pura Spice, RN   Authorized by:   [name] MD   Signed by:   Pura Spice, RN on 09/26/2010   Method used:   Telephoned to ...       [facility] [address] [phone]* (retail)       [address]       [place], [place]  [address]       Ph: [phone] or [phone]       Fax: [phone]   RxID:   [id number]

## 2011-01-02 NOTE — Progress Notes (Signed)
Summary: PHONE CALL CONCERNING MED REFILL  Phone Note Call from Patient   Caller: Patient [phone] Reason for Call: Refill Medication Summary of Call: Pt called to adv that [facility] (off [facility]) told pt that RX for med (LORTAB 7.5 TAB) will have to be phoned or faxed in to the Pharmacy.... same cannot be accepted via electronic request method... Pt req that same be taken care of asap.  Pt can be reached at [phone] with any questions or concerns.  Initial call taken by: Debbra Riding,  December 13, 2009 4:24 PM  Follow-up for Phone Call        call in Lortab #120 with 5rf Follow-up by: [name] MD,  December 14, 2009 10:13 AM  Additional Follow-up for Phone Call Additional follow up Details #1::        Phone Call Completed, Rx Called In Additional Follow-up by: Alfred Levins, CMA,  December 14, 2009 10:41 AM

## 2011-01-02 NOTE — Progress Notes (Signed)
Summary: Pt req refill of Clonidine HCL 0.1 mg  Phone Note Refill Request Message from:  Patient on October 23, 2010 8:33 AM  Refills Requested: Medication #1:  CLONIDINE HCL 0.1 MG TABS two times a day   Dosage confirmed as above?Dosage Confirmed   Supply Requested: 3 months Pls call in to [facility] on [address]   Method Requested: Telephone to Pharmacy Initial call taken by: Lucy Antigua,  October 23, 2010 8:33 AM  Follow-up for Phone Call        call in #60 with 11 rf  Follow-up by: [name] MD,  October 23, 2010 1:07 PM  Additional Follow-up for Phone Call Additional follow up Details #1::        called pt states having pain in left shoulder  and would rate this pain as 10 being the worse. vicroporfen  not helpling informed pt that when she called the other day she was advised that Dr Clent Ridges would see her but if cannot wait to go to ER .  Additional Follow-up by: Pura Spice, RN,  October 23, 2010 2:10 PM    Additional Follow-up for Phone Call Additional follow up Details #2::    I suggest she go to Urgent Care  Follow-up by: [name] MD,  October 23, 2010 2:33 PM  Additional Follow-up for Phone Call Additional follow up Details #3:: Details for Additional Follow-up Action Taken: pt aware Additional Follow-up by: Pura Spice, RN,  October 23, 2010 2:35 PM  New/Updated Medications: CLONIDINE HCL 0.1 MG TABS (CLONIDINE HCL) two times a day Prescriptions: CLONIDINE HCL 0.1 MG TABS (CLONIDINE HCL) two times a day  #60 x 11   Entered by:   Pura Spice, RN   Authorized by:   [name] MD   Signed by:   Pura Spice, RN on 10/23/2010   Method used:   Telephoned to ...       [facility] [address] [phone]* (retail)       [address]       [place], [place]  [address]       Ph: [phone] or [phone]       Fax: [phone]   RxID:   [id number]

## 2011-01-02 NOTE — Progress Notes (Signed)
Summary: refill xanax  Phone Note Refill Request Message from:  Patient on January 14, 2010 8:10 AM  Refills Requested: Medication #1:  ALPRAZOLAM 2 MG TABS 1 every 6 hours [facility] [address]   Method Requested: Electronic Initial call taken by: Alfred Levins, CMA,  January 14, 2010 8:11 AM  Follow-up for Phone Call        call in #120 with 5 rf Follow-up by: [name] MD,  January 14, 2010 8:58 AM  Additional Follow-up for Phone Call Additional follow up Details #1::        Rx called to pharmacy Additional Follow-up by: Alfred Levins, CMA,  January 14, 2010 3:56 PM    Prescriptions: ALPRAZOLAM 2 MG TABS (ALPRAZOLAM) 1 every 6 hours  #120 x 5   Entered by:   Alfred Levins, CMA   Authorized by:   [name] MD   Signed by:   Alfred Levins, CMA on 01/14/2010   Method used:   Telephoned to ...       [facility] [address] [phone]* (retail)       [address]       [place], [place]  [address]       Ph: [phone]       Fax: [phone]   RxID:   [id number]

## 2011-01-02 NOTE — Progress Notes (Signed)
Summary: refill  Phone Note Refill Request Call back at Home Phone [phone] Message from:  Patient--live call  Refills Requested: Medication #1:  OXYCODONE HCL 10 MG TABS 1 q 6 hours as needed pain. call pt when ready.  Initial call taken by: Warnell Forester,  November 27, 2010 9:13 AM  Follow-up for Phone Call        pt called again checking on the status. Follow-up by: Warnell Forester,  November 27, 2010 1:05 PM  Additional Follow-up for Phone Call Additional follow up Details #1::        pt cb Additional Follow-up by: Heron Sabins,  November 27, 2010 3:48 PM    Additional Follow-up for Phone Call Additional follow up Details #2::    done Follow-up by: [name] MD,  November 27, 2010 5:29 PM  Additional Follow-up for Phone Call Additional follow up Details #3:: Details for Additional Follow-up Action Taken: pt aware  Additional Follow-up by: Pura Spice, RN,  November 27, 2010 5:30 PM  Prescriptions: OXYCODONE HCL 10 MG TABS (OXYCODONE HCL) 1 q 6 hours as needed pain  #120 x 0   Entered and Authorized by:   [name] MD   Signed by:   [name] MD on 11/27/2010   Method used:   Print then Give to Patient   RxID:   [id number]

## 2011-01-02 NOTE — Assessment & Plan Note (Signed)
Summary: headaches//ccm   Vital Signs:  Patient profile:   45 year old female Weight:      223 pounds O2 Sat:      94 % Temp:     98.3 degrees F Pulse rate:   89 / minute BP sitting:   120 / 80  (left arm) Cuff size:   large  Vitals Entered By: Pura Spice, RN (September 24, 2010 8:55 AM) CC: cough  congestion x 2 wks headache    History of Present Illness: Here for 2 weeks of sinus pressure, HAs, PND, dry cough, and body aches. No fever. Drinking fluids. She also asks for more pain medications to help her chronic low back pain. She no longer sees Dr. Leslee Home.   Preventive Screening-Counseling & Management  Alcohol-Tobacco     Smoking Status: current     Packs/Day: 0.5  Allergies (verified): No Known Drug Allergies  Past History:  Past Medical History: Diabetes mellitus, type II Hypertension Alcoholism Anxiety Disorder Chronic Headaches Depression GERD Hyperlipidemia Urinary Tract Infection Low back pain Cirrhosis  Past Surgical History: Reviewed history from 07/02/2010 and no changes required. Tubal ligation Carpal tunnel release (left) Lumbar laminectomy at L5-S1 on 06-04-10 per Dr. Leslee Home  Social History: Packs/Day:  0.5  Review of Systems  The patient denies anorexia, fever, weight loss, weight gain, vision loss, decreased hearing, hoarseness, chest pain, syncope, dyspnea on exertion, peripheral edema, hemoptysis, abdominal pain, melena, hematochezia, severe indigestion/heartburn, hematuria, incontinence, genital sores, muscle weakness, suspicious skin lesions, transient blindness, difficulty walking, depression, unusual weight change, abnormal bleeding, enlarged lymph nodes, angioedema, breast masses, and testicular masses.    Physical Exam  General:  Well-developed,well-nourished,in no acute distress; alert,appropriate and cooperative throughout examination Head:  Normocephalic and atraumatic without obvious abnormalities. No apparent alopecia or  balding. Eyes:  No corneal or conjunctival inflammation noted. EOMI. Perrla. Funduscopic exam benign, without hemorrhages, exudates or papilledema. Vision grossly normal. Ears:  External ear exam shows no significant lesions or deformities.  Otoscopic examination reveals clear canals, tympanic membranes are intact bilaterally without bulging, retraction, inflammation or discharge. Hearing is grossly normal bilaterally. Nose:  External nasal examination shows no deformity or inflammation. Nasal mucosa are pink and moist without lesions or exudates. Mouth:  Oral mucosa and oropharynx without lesions or exudates.  Teeth in good repair. Neck:  No deformities, masses, or tenderness noted. Lungs:  Normal respiratory effort, chest expands symmetrically. Lungs are clear to auscultation, no crackles or wheezes.   Impression & Recommendations:  Problem # 1:  ACUTE SINUSITIS, UNSPECIFIED (ICD-461.9)  Her updated medication list for this problem includes:    Trimethoprim 100 Mg Tabs (Trimethoprim) .Marland Kitchen... 1 by mouth two times a day    Hydromet 5-1.5 Mg/78ml Syrp (Hydrocodone-homatropine) .Marland Kitchen... 1 tsp q 4 hours as needed cough    Avelox 400 Mg Tabs (Moxifloxacin hcl) ..... Once daily  Problem # 2:  LOW BACK PAIN (ICD-724.2)  The following medications were removed from the medication list:    Robaxin-750 750 Mg Tabs (Methocarbamol) .Marland Kitchen... 1 q 6 hours as needed spasm    Vicoprofen 7.5-200 Mg Tabs (Hydrocodone-ibuprofen) .Marland Kitchen... 1 q 6 hours as needed for pain Her updated medication list for this problem includes:    Vicoprofen 7.5-200 Mg Tabs (Hydrocodone-ibuprofen) .Marland Kitchen... 1 q 6 hours as needed pain  Complete Medication List: 1)  Clonidine Hcl 0.1 Mg Tabs (Clonidine hcl) .... Two times a day 2)  Trimethoprim 100 Mg Tabs (Trimethoprim) .Marland Kitchen.. 1 by mouth two times a day  3)  Prozac 40 Mg Caps (Fluoxetine hcl) .Marland Kitchen.. 1 by mouth two times a day 4)  Alprazolam 2 Mg Tabs (Alprazolam) .Marland Kitchen.. 1 every 6 hours 5)  Klor-con  M20 20 Meq Cr-tabs (Potassium chloride crys cr) .... 1/2 by mouth once daily 6)  Accu-chek Comfort Curve Strp (Glucose blood) .Marland Kitchen.. 1 by mouth once daily 7)  Metformin Hcl 1000 Mg Tabs (Metformin hcl) .... Two times a day 8)  Onglyza 5 Mg Tabs (Saxagliptin hcl) .... Once daily 9)  Furosemide 40 Mg Tabs (Furosemide) .... 2 tabs two times a day 10)  Spironolactone 100 Mg Tabs (Spironolactone) .... Once daily 11)  Hydromet 5-1.5 Mg/78ml Syrp (Hydrocodone-homatropine) .Marland Kitchen.. 1 tsp q 4 hours as needed cough 12)  Avelox 400 Mg Tabs (Moxifloxacin hcl) .... Once daily 13)  Vicoprofen 7.5-200 Mg Tabs (Hydrocodone-ibuprofen) .Marland Kitchen.. 1 q 6 hours as needed pain  Other Orders: Ketorolac-Toradol 15mg  (U0454) Admin of Therapeutic Inj  intramuscular or subcutaneous (09811)  Patient Instructions: 1)  Please schedule a follow-up appointment as needed .  Prescriptions: VICOPROFEN 7.5-200 MG TABS (HYDROCODONE-IBUPROFEN) 1 q 6 hours as needed pain  #60 x 2   Entered and Authorized by:   [name] MD   Signed by:   [name] MD on 09/24/2010   Method used:   Print then Give to Patient   RxID:   [id number] AVELOX 400 MG TABS (MOXIFLOXACIN HCL) once daily  #10 x 0   Entered and Authorized by:   [name] MD   Signed by:   [name] MD on 09/24/2010   Method used:   Samples Given   RxID:   [id number] HYDROMET 5-1.5 MG/5ML SYRP (HYDROCODONE-HOMATROPINE) 1 tsp q 4 hours as needed cough  #240 x 0   Entered and Authorized by:   [name] MD   Signed by:   [name] MD on 09/24/2010   Method used:   Print then Give to Patient   RxID:   [id number] PROZAC 40 MG  CAPS (FLUOXETINE HCL) 1 by mouth two times a day  #60 x 11   Entered and Authorized by:   [name] MD   Signed by:   [name] MD on 09/24/2010   Method used:   Print then Give to Patient   RxID:   [id number]    Medication Administration  Injection # 1:    Medication: Ketorolac-Toradol 15mg      Diagnosis: HEADACHE (ICD-784.0)    Route: IM    Site: RUOQ gluteus    Exp Date: 01/30/2012    Lot #: [id number]    Mfr: novaplus    Comments: 60mg  given    Patient tolerated injection without complications    Given by: Pura Spice, RN (September 24, 2010 10:28 AM)  Orders Added: 1)  Est. Patient Level IV [66063] 2)  Ketorolac-Toradol 15mg  [J1885] 3)  Admin of Therapeutic Inj  intramuscular or subcutaneous [01601]

## 2011-01-02 NOTE — Letter (Signed)
Summary: [facility]  [facility]   Imported By: Maryln Gottron 05/20/2010 12:19:26  _____________________________________________________________________  External Attachment:    Type:   Image     Comment:   External Document

## 2011-01-02 NOTE — Progress Notes (Signed)
Summary: Schedule Colonoscopy   Phone Note Outgoing Call Call back at [facility] Phone [phone]   Call placed by: Harlow Mares CMA [facility]),  Apr 18, 2010 2:50 PM Call placed to: Patient Summary of Call: numbers listed in the chart are not correct, we will mail the patietn a letter to remind her of her colonoscopy. Initial call taken by: Harlow Mares CMA [facility]),  Apr 18, 2010 2:50 PM

## 2011-01-02 NOTE — Progress Notes (Signed)
----   Converted from flag ---- ---- 10/10/2010 4:42 PM, Elizebeth Brooking McGowen M.D. wrote: Please call and notify pt that her potassium is very low due to her vomiting and diarrhea (2.6), so she needs to take some extra oral potassium over the next couple of days.  She should have 20 mEq potassium tabs already according to her med list....have her take 2 of these now and 2 tabs again before bedtime.  Then starting tomorrow have her take one tab twice per day for 3 days.  Needs potassium recheck on monday the 14th.  Thanks--PM ------------------------------  Patient is aware.  She also stated that she was unable to bring urine sample in to the office because her son was still at work.  She said she will bring it Monday when she comes for her lab appointment

## 2011-01-02 NOTE — Progress Notes (Signed)
Summary: REQ FOR RX   Phone Note Call from Patient   Caller: Patient   [phone] Summary of Call: Pt called to adv that she believes she has a UTI.... Pt c/o burning sensation while urinating, frequent urination, back pain (just had back surgery recently)....denies fever.... Pt states she is unable to come in because she can't drive due to recent surgery, would like to have a abx sent to [facility] - [facility] / [address].... Pt can be reached at [phone] with any questions or concerns.   Initial call taken by: Debbra Riding,  May 23, 2010 8:44 AM  Follow-up for Phone Call        PT has new phone number [phone] Sid Falcon LPN  May 23, 2010 10:41 AM   Additional Follow-up for Phone Call Additional follow up Details #1::        call in Cipro 500 mg two times a day for 7 days Additional Follow-up by: [name] MD,  May 23, 2010 11:13 AM    Additional Follow-up for Phone Call Additional follow up Details #2::    Rx called in Follow-up by: Raechel Ache, RN,  May 23, 2010 11:22 AM

## 2011-01-02 NOTE — Assessment & Plan Note (Signed)
Summary: leg and back pain/dm   Vital Signs:  Patient profile:   45 year old female Weight:      240 pounds Temp:     98.4 degrees F oral BP sitting:   120 / 80  (left arm) Cuff size:   regular  Vitals Entered By: Kathrynn Speed CMA (July 02, 2010 5:01 PM) CC: Leg & Back pain, for one year, had  back surgery on 06/04/10 for repured disc lower back, on new meds doesn't have bottles with her,src   History of Present Illness: Here to ask for med refills since she has almost run out. She had back surgery per Dr. Leslee Home in early July, and he has been working with her with PT and meds since then.  However she is about to run out, and Dr. Leslee Home will not give her refills until he sees her again. She is scheduled to see him this Friday. using Vicoprofen and Robaxin. Since the surgery she has been unable to exercise much at all, and she has gained a lot of weight. She has a lot of swelling in the legs and feet, and this is very uncomfortable. No SOB. She still takes Lasix as before. She says her glucoses are fairly stable.   Current Medications (verified): 1)  Clonidine Hcl 0.1 Mg Tabs (Clonidine Hcl) .... Two Times A Day 2)  Trimethoprim 100 Mg Tabs (Trimethoprim) .Marland Kitchen.. 1 By Mouth Two Times A Day 3)  Lasix 40 Mg Tabs (Furosemide) .... Take 2 Tablets in The Am and 1 in The Pm 4)  Prozac 40 Mg  Caps (Fluoxetine Hcl) .Marland Kitchen.. 1 By Mouth Two Times A Day 5)  Alprazolam 2 Mg Tabs (Alprazolam) .Marland Kitchen.. 1 Every 6 Hours 6)  Klor-Con M20 20 Meq Cr-Tabs (Potassium Chloride Crys Cr) .... 1/2 By Mouth Once Daily 7)  Accu-Chek Comfort Curve  Strp (Glucose Blood) .Marland Kitchen.. 1 By Mouth Once Daily 8)  Metformin Hcl 1000 Mg Tabs (Metformin Hcl) .... Two Times A Day 9)  Onglyza 5 Mg Tabs (Saxagliptin Hcl) .... Once Daily 10)  Furosemide 40 Mg Tabs (Furosemide) .... 2 Tabs Two Times A Day  Allergies (verified): No Known Drug Allergies  Past History:  Past Medical History: Diabetes mellitus, type  II Hypertension Alcoholism Anxiety Disorder Chronic Headaches Depression GERD Hyperlipidemia Urinary Tract Infection Low back pain  Past Surgical History: Tubal ligation Carpal tunnel release (left) Lumbar laminectomy at L5-S1 on 06-04-10 per Dr. Leslee Home  Review of Systems  The patient denies anorexia, fever, weight loss, vision loss, decreased hearing, hoarseness, chest pain, syncope, dyspnea on exertion, prolonged cough, headaches, hemoptysis, abdominal pain, melena, hematochezia, severe indigestion/heartburn, hematuria, incontinence, genital sores, muscle weakness, suspicious skin lesions, transient blindness, difficulty walking, depression, unusual weight change, abnormal bleeding, enlarged lymph nodes, angioedema, breast masses, and testicular masses.    Physical Exam  General:  overweight-appearing.   Neck:  No deformities, masses, or tenderness noted. Lungs:  Normal respiratory effort, chest expands symmetrically. Lungs are clear to auscultation, no crackles or wheezes. Heart:  Normal rate and regular rhythm. S1 and S2 normal without gallop, murmur, click, rub or other extra sounds. Extremities:  4+ edema in both legs to knees   Impression & Recommendations:  Problem # 1:  LOW BACK PAIN (ICD-724.2)  The following medications were removed from the medication list:    Lortab 7.5 7.5-500 Mg Tabs (Hydrocodone-acetaminophen) .Marland Kitchen... 1 by mouth every 6 hour as needed for pain Her updated medication list for this problem includes:  Robaxin-750 750 Mg Tabs (Methocarbamol) .Marland Kitchen... 1 q 6 hours as needed spasm    Vicoprofen 7.5-200 Mg Tabs (Hydrocodone-ibuprofen) .Marland Kitchen... 1 q 6 hours as needed for pain  Problem # 2:  HYPERTENSION (ICD-401.9)  The following medications were removed from the medication list:    Lasix 40 Mg Tabs (Furosemide) .Marland Kitchen... Take 2 tablets in the am and 1 in the pm Her updated medication list for this problem includes:    Clonidine Hcl 0.1 Mg Tabs (Clonidine  hcl) .Marland Kitchen..Marland Kitchen Two times a day    Furosemide 40 Mg Tabs (Furosemide) .Marland Kitchen... 2 tabs two times a day    Spironolactone 100 Mg Tabs (Spironolactone) ..... Once daily  Problem # 3:  DIABETES MELLITUS, TYPE II (ICD-250.00)  Her updated medication list for this problem includes:    Metformin Hcl 1000 Mg Tabs (Metformin hcl) .Marland Kitchen..Marland Kitchen Two times a day    Onglyza 5 Mg Tabs (Saxagliptin hcl) ..... Once daily  Complete Medication List: 1)  Clonidine Hcl 0.1 Mg Tabs (Clonidine hcl) .... Two times a day 2)  Trimethoprim 100 Mg Tabs (Trimethoprim) .Marland Kitchen.. 1 by mouth two times a day 3)  Prozac 40 Mg Caps (Fluoxetine hcl) .Marland Kitchen.. 1 by mouth two times a day 4)  Alprazolam 2 Mg Tabs (Alprazolam) .Marland Kitchen.. 1 every 6 hours 5)  Klor-con M20 20 Meq Cr-tabs (Potassium chloride crys cr) .... 1/2 by mouth once daily 6)  Accu-chek Comfort Curve Strp (Glucose blood) .Marland Kitchen.. 1 by mouth once daily 7)  Metformin Hcl 1000 Mg Tabs (Metformin hcl) .... Two times a day 8)  Onglyza 5 Mg Tabs (Saxagliptin hcl) .... Once daily 9)  Furosemide 40 Mg Tabs (Furosemide) .... 2 tabs two times a day 10)  Spironolactone 100 Mg Tabs (Spironolactone) .... Once daily 11)  Robaxin-750 750 Mg Tabs (Methocarbamol) .Marland Kitchen.. 1 q 6 hours as needed spasm 12)  Vicoprofen 7.5-200 Mg Tabs (Hydrocodone-ibuprofen) .Marland Kitchen.. 1 q 6 hours as needed for pain  Patient Instructions: 1)  Gave her a one time refill of pain meds. She needs to follow up with Dr. Leslee Home about her back. Add Spironolactone to the Lasix. She needs to see me soon to get labs to follow her DM, cirrhosis, HTN, etc.  Prescriptions: ALPRAZOLAM 2 MG TABS (ALPRAZOLAM) 1 every 6 hours  #120 x 5   Entered and Authorized by:   [name] MD   Signed by:   [name] MD on 07/02/2010   Method used:   Print then Give to Patient   RxID:   [id number] VICOPROFEN 7.5-200 MG TABS (HYDROCODONE-IBUPROFEN) 1 q 6 hours as needed for pain  #60 x 0   Entered and Authorized by:   [name] MD   Signed by:    [name] MD on 07/02/2010   Method used:   Print then Give to Patient   RxID:   [id number] ROBAXIN-750 750 MG TABS (METHOCARBAMOL) 1 q 6 hours as needed spasm  #60 x 0   Entered and Authorized by:   [name] MD   Signed by:   [name] MD on 07/02/2010   Method used:   Print then Give to Patient   RxID:   [id number] SPIRONOLACTONE 100 MG TABS (SPIRONOLACTONE) once daily  #30 x 5   Entered and Authorized by:   [name] MD   Signed by:   [name] MD on 07/02/2010   Method used:   Print then Give to Patient  RxID:   [id number]
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
## 2011-01-02 NOTE — Progress Notes (Signed)
Summary: mylagias  Phone Note Call from Patient Call back at Home Phone [phone]   Caller: Patient Call For: [name] MD Summary of Call: Dyann Kief [facility]) Pt is having extreme pain all over due to recent seizure, and the Vicoprofen is not helping........    Initial call taken by: [facility] CMA AAMA,  October 17, 2010 9:12 AM  Follow-up for Phone Call        if she can wait until tomorrow, I will see her then. If she is in too much pain today, I suggest she go to the ER  Follow-up by: [name] MD,  October 17, 2010 10:06 AM  Additional Follow-up for Phone Call Additional follow up Details #1::        Pt. prefers to wait and see how she feels tomorrow,and see Dr. Clent Ridges if no better. Additional Follow-up by: Lynann Beaver CMA AAMA,  October 17, 2010 10:14 AM

## 2011-01-02 NOTE — Assessment & Plan Note (Signed)
Summary: back pain and cough/dm   Vital Signs:  Patient profile:   45 year old female O2 Sat:      98 % Temp:     98 degrees F Pulse rate:   92 / minute BP sitting:   120 / 80  (left arm) Cuff size:   large  Vitals Entered By: Pura Spice, RN (October 29, 2010 3:30 PM) CC: cough states cough so much pain med not helping  wants something stronger.   History of Present Illness: Here for 2 reasons. First for the past 5 days she has had sinus pressure, PND, chest congestion, and coughing up yellow sputum. No fever. Also, she continues to have a lot of low back pain despite taking Vicoprofen.   Allergies (verified): No Known Drug Allergies  Past History:  Past Medical History: Reviewed history from 09/24/2010 and no changes required. Diabetes mellitus, type II Hypertension Alcoholism Anxiety Disorder Chronic Headaches Depression GERD Hyperlipidemia Urinary Tract Infection Low back pain Cirrhosis  Past Surgical History: Reviewed history from 07/02/2010 and no changes required. Tubal ligation Carpal tunnel release (left) Lumbar laminectomy at L5-S1 on 06-04-10 per Dr. Leslee Home  Review of Systems  The patient denies anorexia, fever, weight loss, weight gain, vision loss, decreased hearing, hoarseness, chest pain, syncope, dyspnea on exertion, peripheral edema, hemoptysis, abdominal pain, melena, hematochezia, severe indigestion/heartburn, hematuria, incontinence, genital sores, muscle weakness, suspicious skin lesions, transient blindness, difficulty walking, depression, unusual weight change, abnormal bleeding, enlarged lymph nodes, angioedema, breast masses, and testicular masses.    Physical Exam  General:  alert but appears ill, coughing  Head:  Normocephalic and atraumatic without obvious abnormalities. No apparent alopecia or balding. Eyes:  No corneal or conjunctival inflammation noted. EOMI. Perrla. Funduscopic exam benign, without hemorrhages, exudates or  papilledema. Vision grossly normal. Ears:  External ear exam shows no significant lesions or deformities.  Otoscopic examination reveals clear canals, tympanic membranes are intact bilaterally without bulging, retraction, inflammation or discharge. Hearing is grossly normal bilaterally. Nose:  External nasal examination shows no deformity or inflammation. Nasal mucosa are pink and moist without lesions or exudates. Mouth:  Oral mucosa and oropharynx without lesions or exudates.  Teeth in good repair. Neck:  No deformities, masses, or tenderness noted. Lungs:  scattered rhonchi    Impression & Recommendations:  Problem # 1:  ACUTE BRONCHITIS (ICD-466.0)  Her updated medication list for this problem includes:    Trimethoprim 100 Mg Tabs (Trimethoprim) .Marland Kitchen... 1 by mouth two times a day    Hydromet 5-1.5 Mg/54ml Syrp (Hydrocodone-homatropine) .Marland Kitchen... 1 tsp q 4 hours as needed cough    Zithromax Z-pak 250 Mg Tabs (Azithromycin) .Marland Kitchen... As directed  Problem # 2:  LOW BACK PAIN (ICD-724.2)  Her updated medication list for this problem includes:    Vicoprofen 7.5-200 Mg Tabs (Hydrocodone-ibuprofen) .Marland Kitchen... 1 q 6 hours as needed pain    Oxycodone Hcl 10 Mg Tabs (Oxycodone hcl) .Marland Kitchen... 1 q 6 hours as needed pain  Complete Medication List: 1)  Clonidine Hcl 0.1 Mg Tabs (Clonidine hcl) .... Two times a day 2)  Trimethoprim 100 Mg Tabs (Trimethoprim) .Marland Kitchen.. 1 by mouth two times a day 3)  Prozac 40 Mg Caps (Fluoxetine hcl) .Marland Kitchen.. 1 by mouth two times a day 4)  Alprazolam 2 Mg Tabs (Alprazolam) .Marland Kitchen.. 1 every 6 hours 5)  Klor-con M20 20 Meq Cr-tabs (Potassium chloride crys cr) .... Two times a day 6)  Accu-chek Comfort Curve Strp (Glucose blood) .Marland Kitchen.. 1 by mouth once  daily 7)  Metformin Hcl 1000 Mg Tabs (Metformin hcl) .... Two times a day 8)  Onglyza 5 Mg Tabs (Saxagliptin hcl) .... Once daily 9)  Furosemide 40 Mg Tabs (Furosemide) .... 2 tabs two times a day 10)  Spironolactone 100 Mg Tabs (Spironolactone) ....  Once daily 11)  Hydromet 5-1.5 Mg/44ml Syrp (Hydrocodone-homatropine) .Marland Kitchen.. 1 tsp q 4 hours as needed cough 12)  Vicoprofen 7.5-200 Mg Tabs (Hydrocodone-ibuprofen) .Marland Kitchen.. 1 q 6 hours as needed pain 13)  Promethazine Hcl 25 Mg Tabs (Promethazine hcl) .Marland Kitchen.. 1 q 4 hours as needed nausea 14)  Zithromax Z-pak 250 Mg Tabs (Azithromycin) .... As directed 15)  Oxycodone Hcl 10 Mg Tabs (Oxycodone hcl) .Marland Kitchen.. 1 q 6 hours as needed pain  Patient Instructions: 1)  Please schedule a follow-up appointment as needed .  Prescriptions: OXYCODONE HCL 10 MG TABS (OXYCODONE HCL) 1 q 6 hours as needed pain  #120 x 0   Entered and Authorized by:   [name] MD   Signed by:   [name] MD on 10/29/2010   Method used:   Print then Give to Patient   RxID:   [id number] HYDROMET 5-1.5 MG/5ML SYRP (HYDROCODONE-HOMATROPINE) 1 tsp q 4 hours as needed cough  #240 x 0   Entered and Authorized by:   [name] MD   Signed by:   [name] MD on 10/29/2010   Method used:   Print then Give to Patient   RxID:   [id number] QIONGEXBM Z-PAK 250 MG TABS (AZITHROMYCIN) as directed  #1 x 0   Entered and Authorized by:   [name] MD   Signed by:   [name] MD on 10/29/2010   Method used:   Print then Give to Patient   RxID:   [id number]    Orders Added: 1)  Est. Patient Level IV [64403]

## 2011-01-02 NOTE — Progress Notes (Signed)
Summary: chest and back pain/dm  Phone Note Call from Patient   Caller: Patient Call For: [name] MD Summary of Call: Pt is asking for pain meds for back pain post seizure.  Vicoprofen is not helping.  [facility] ([address])  Also, has deep productive cough.  Will come tomorrow to see Dr. Clent Ridges. [phone] Initial call taken by: Lynann Beaver CMA AAMA,  October 28, 2010 10:27 AM  Follow-up for Phone Call        we will discuss this tomorrow Follow-up by: [name] MD,  October 28, 2010 1:06 PM

## 2011-01-02 NOTE — Progress Notes (Signed)
----   Converted from flag ---- ---- 10/10/2010 5:04 PM, Kern Reap CMA (AAMA) wrote: patient is aware of her lab results and verbally repeated med orders.  Lab appointment also made for Monday ------------------------------

## 2011-01-02 NOTE — Assessment & Plan Note (Signed)
Summary: elevated BS/dm   Vital Signs:  Patient profile:   45 year old female Weight:      212 pounds O2 Sat:      98 % Temp:     97.7 degrees F Pulse rate:   88 / minute BP sitting:   130 / 80  (left arm) Cuff size:   large  Vitals Entered By: Pura Spice, RN (October 10, 2010 10:50 AM) CC: vomiting for 3-4 days states last nite thinks had seizure nite lasting about 3-5 min BS 174 at hs did not check this am.  Is Patient Diabetic? Yes Did you bring your meter with you today? No   History of Present Illness: Here with her husband for what sounds like  a seizure in the middle of the night last night. For the past 4 days she has had constant nausea and vomiting, along with some diarrhea. No abdominal pains or fever. She has had almost no food for 4 days and just a limited amount of fluids, however she has continued to take her usual medications, including diabetes meds. She had not checked her glucoses for awhile but last night after this episode stopped and after she drank some sweet tea her glucose was 174. She had fallen asleep in a recliner last night in front of the TV, when her husband was awakened by a thumping noise. He then saw her rigid in her chair with arms and legs tight and fully extended, she was drooling saliva, her eyes were rolled back in her head, and she was unresponsive. After about 3-5 minutes she relaxed, and she began to respond and talk to them. She drank some sweet tea, and quickly felt back to normal. She has felt fine since then except for nausea and some weakness. She has never had a seizure before. She did bite her tongue during this spell, but had no loss of bowel or bladder control. She has not eaten anything today but has sipped some Diet Sprite. Her glucose right now is 138.   Allergies (verified): No Known Drug Allergies  Past History:  Past Medical History: Reviewed history from 09/24/2010 and no changes required. Diabetes mellitus, type  II Hypertension Alcoholism Anxiety Disorder Chronic Headaches Depression GERD Hyperlipidemia Urinary Tract Infection Low back pain Cirrhosis  Past Surgical History: Reviewed history from 07/02/2010 and no changes required. Tubal ligation Carpal tunnel release (left) Lumbar laminectomy at L5-S1 on 06-04-10 per Dr. Leslee Home  Review of Systems  The patient denies anorexia, fever, weight loss, weight gain, vision loss, decreased hearing, hoarseness, chest pain, syncope, dyspnea on exertion, peripheral edema, prolonged cough, headaches, hemoptysis, abdominal pain, melena, hematochezia, severe indigestion/heartburn, hematuria, incontinence, genital sores, muscle weakness, suspicious skin lesions, transient blindness, difficulty walking, depression, unusual weight change, abnormal bleeding, enlarged lymph nodes, angioedema, breast masses, and testicular masses.    Physical Exam  General:  Well-developed,well-nourished,in no acute distress; alert,appropriate and cooperative throughout examination Mouth:  small laceration on the tip of the tongue Lungs:  Normal respiratory effort, chest expands symmetrically. Lungs are clear to auscultation, no crackles or wheezes. Heart:  Normal rate and regular rhythm. S1 and S2 normal without gallop, murmur, click, rub or other extra sounds. Abdomen:  Bowel sounds positive,abdomen soft and non-tender without masses, organomegaly or hernias noted. Neurologic:  No cranial nerve deficits noted. Station and gait are normal. Plantar reflexes are down-going bilaterally. DTRs are symmetrical throughout. Sensory, motor and coordinative functions appear intact.   Impression & Recommendations:  Problem #  1:  SEIZURE, GRAND MAL (ICD-345.10)  Problem # 2:  GASTROENTERITIS, VIRAL (ICD-008.8)  Problem # 3:  DIABETES MELLITUS, TYPE II (ICD-250.00)  Her updated medication list for this problem includes:    Metformin Hcl 1000 Mg Tabs (Metformin hcl) .Marland Kitchen..Marland Kitchen Two times  a day    Onglyza 5 Mg Tabs (Saxagliptin hcl) ..... Once daily  Orders: UA Dipstick w/o Micro (automated)  (81003) Venipuncture (32951) TLB-Lipid Panel (80061-LIPID) TLB-BMP (Basic Metabolic Panel-BMET) (80048-METABOL) TLB-CBC Platelet - w/Differential (85025-CBCD) TLB-Hepatic/Liver Function Pnl (80076-HEPATIC) TLB-TSH (Thyroid Stimulating Hormone) (84443-TSH) TLB-A1C / Hgb A1C (Glycohemoglobin) (83036-A1C) TLB-Microalbumin/Creat Ratio, Urine (82043-MALB) Specimen Handling (88416)  Problem # 4:  HYPERTENSION (ICD-401.9)  Her updated medication list for this problem includes:    Clonidine Hcl 0.1 Mg Tabs (Clonidine hcl) .Marland Kitchen..Marland Kitchen Two times a day    Furosemide 40 Mg Tabs (Furosemide) .Marland Kitchen... 2 tabs two times a day    Spironolactone 100 Mg Tabs (Spironolactone) ..... Once daily  Complete Medication List: 1)  Clonidine Hcl 0.1 Mg Tabs (Clonidine hcl) .... Two times a day 2)  Trimethoprim 100 Mg Tabs (Trimethoprim) .Marland Kitchen.. 1 by mouth two times a day 3)  Prozac 40 Mg Caps (Fluoxetine hcl) .Marland Kitchen.. 1 by mouth two times a day 4)  Alprazolam 2 Mg Tabs (Alprazolam) .Marland Kitchen.. 1 every 6 hours 5)  Klor-con M20 20 Meq Cr-tabs (Potassium chloride crys cr) .... 1/2 by mouth once daily 6)  Accu-chek Comfort Curve Strp (Glucose blood) .Marland Kitchen.. 1 by mouth once daily 7)  Metformin Hcl 1000 Mg Tabs (Metformin hcl) .... Two times a day 8)  Onglyza 5 Mg Tabs (Saxagliptin hcl) .... Once daily 9)  Furosemide 40 Mg Tabs (Furosemide) .... 2 tabs two times a day 10)  Spironolactone 100 Mg Tabs (Spironolactone) .... Once daily 11)  Hydromet 5-1.5 Mg/45ml Syrp (Hydrocodone-homatropine) .Marland Kitchen.. 1 tsp q 4 hours as needed cough 12)  Vicoprofen 7.5-200 Mg Tabs (Hydrocodone-ibuprofen) .Marland Kitchen.. 1 q 6 hours as needed pain 13)  Promethazine Hcl 25 Mg Tabs (Promethazine hcl) .Marland Kitchen.. 1 q 4 hours as needed nausea  Other Orders: Ketorolac-Toradol 15mg  (S0630) Admin of Therapeutic Inj  intramuscular or subcutaneous (16010)  Patient Instructions: 1)   I think her seizure was almost certainly due to hypoglycemia, so I advised her in the future to stop taking her diabetic meds if she ever vomited for more than 24 hours. She will take Phenergan for the nausea, and try to get in some food and fluids today. Get some labs/  2)  Please schedule a follow-up appointment as needed .  Prescriptions: PROMETHAZINE HCL 25 MG TABS (PROMETHAZINE HCL) 1 q 4 hours as needed nausea  #60 x 5   Entered and Authorized by:   [name] MD   Signed by:   [name] MD on 10/10/2010   Method used:   Electronically to        [facility] [address] [phone]* (retail)       [address]       [place], [place]  [address]       Ph: [phone] or [phone]       Fax: [phone]   RxID:   [id number]    Medication Administration  Injection # 1:    Medication: Ketorolac-Toradol 15mg     Diagnosis: HEADACHE (ICD-784.0)    Route: IM    Site: RUOQ gluteus    Exp Date: 01/30/2012    Lot #: [id number]  Mfr: novaplus    Comments: 60 mg given     Patient tolerated injection without complications    Given by: Pura Spice, RN (October 10, 2010 12:02 PM)  Orders Added: 1)  Est. Patient Level IV [81191] 2)  UA Dipstick w/o Micro (automated)  [81003] 3)  Venipuncture [36415] 4)  TLB-Lipid Panel [80061-LIPID] 5)  TLB-BMP (Basic Metabolic Panel-BMET) [80048-METABOL] 6)  TLB-CBC Platelet - w/Differential [85025-CBCD] 7)  TLB-Hepatic/Liver Function Pnl [80076-HEPATIC] 8)  TLB-TSH (Thyroid Stimulating Hormone) [84443-TSH] 9)  TLB-A1C / Hgb A1C (Glycohemoglobin) [83036-A1C] 10)  TLB-Microalbumin/Creat Ratio, Urine [82043-MALB] 11)  Ketorolac-Toradol 15mg  [J1885] 12)  Admin of Therapeutic Inj  intramuscular or subcutaneous [96372] 13)  Specimen Handling [99000]

## 2011-01-24 ENCOUNTER — Other Ambulatory Visit: Payer: Self-pay | Admitting: Family Medicine

## 2011-01-24 MED ORDER — OXYCODONE HCL 10 MG PO TABS: 10.0000 mg | ORAL_TABLET | Freq: Four times a day (QID) | ORAL | Status: DC

## 2011-01-24 NOTE — Telephone Encounter (Signed)
Pt notified rx at front desk

## 2011-01-24 NOTE — Telephone Encounter (Signed)
Needs refill on Oxycodone.Marland Kitchen...would like to p/u today because her sister is with her and can provide txp.... Pt can be reached at [phone].

## 2011-01-24 NOTE — Telephone Encounter (Signed)
Pt called to check on status of refill of Oxycodone. Pt wants to pick up today. Pls call

## 2011-01-24 NOTE — Telephone Encounter (Signed)
done

## 2011-01-31 ENCOUNTER — Other Ambulatory Visit: Payer: Self-pay | Admitting: Family Medicine

## 2011-02-06 ENCOUNTER — Telehealth: Payer: Self-pay | Admitting: Family Medicine

## 2011-02-06 NOTE — Telephone Encounter (Signed)
Pt called and says that she has rosacea on her face. Pt is req that Dr Clent Ridges call in a med to [facility] on  [address].

## 2011-02-14 ENCOUNTER — Other Ambulatory Visit: Payer: Self-pay | Admitting: Family Medicine

## 2011-02-14 NOTE — Telephone Encounter (Signed)
May refill once. 

## 2011-02-17 LAB — COMPREHENSIVE METABOLIC PANEL
Albumin: 3.4 g/dL — ABNORMAL LOW (ref 3.5–5.2)
BUN: 5 mg/dL — ABNORMAL LOW (ref 6–23)
Calcium: 8.6 mg/dL (ref 8.4–10.5)
Chloride: 95 mEq/L — ABNORMAL LOW (ref 96–112)
Creatinine, Ser: 0.67 mg/dL (ref 0.4–1.2)
Total Bilirubin: 2.5 mg/dL — ABNORMAL HIGH (ref 0.3–1.2)

## 2011-02-17 LAB — GLUCOSE, CAPILLARY
Glucose-Capillary: 170 mg/dL — ABNORMAL HIGH (ref 70–99)
Glucose-Capillary: 192 mg/dL — ABNORMAL HIGH (ref 70–99)
Glucose-Capillary: 247 mg/dL — ABNORMAL HIGH (ref 70–99)
Glucose-Capillary: 81 mg/dL (ref 70–99)
Glucose-Capillary: 85 mg/dL (ref 70–99)
Glucose-Capillary: 90 mg/dL (ref 70–99)
Glucose-Capillary: 94 mg/dL (ref 70–99)

## 2011-02-24 ENCOUNTER — Other Ambulatory Visit: Payer: Self-pay | Admitting: Family Medicine

## 2011-02-24 MED ORDER — OXYCODONE HCL 10 MG PO TABS: 10.0000 mg | ORAL_TABLET | Freq: Four times a day (QID) | ORAL | Status: DC

## 2011-02-24 NOTE — Telephone Encounter (Signed)
Refill Oxycodone 

## 2011-02-24 NOTE — Telephone Encounter (Signed)
Pt aware rx ready for pick up. 

## 2011-02-24 NOTE — Telephone Encounter (Signed)
done

## 2011-03-25 ENCOUNTER — Telehealth: Payer: Self-pay | Admitting: Family Medicine

## 2011-03-25 MED ORDER — OXYCODONE HCL 10 MG PO TABS: 10.0000 mg | ORAL_TABLET | Freq: Four times a day (QID) | ORAL | Status: DC

## 2011-03-25 NOTE — Telephone Encounter (Signed)
done

## 2011-03-25 NOTE — Telephone Encounter (Signed)
Pt needs new rx oxycodone 10mg  by this thur 03-27-2011.

## 2011-03-31 ENCOUNTER — Other Ambulatory Visit: Payer: Self-pay | Admitting: Family Medicine

## 2011-04-03 NOTE — Telephone Encounter (Signed)
Rx e-scripted. LMOM to inform Pt.

## 2011-04-03 NOTE — Telephone Encounter (Signed)
Call in #60 with 11 rf 

## 2011-04-04 ENCOUNTER — Other Ambulatory Visit: Payer: Self-pay | Admitting: Family Medicine

## 2011-04-08 ENCOUNTER — Telehealth: Payer: Self-pay | Admitting: Family Medicine

## 2011-04-08 NOTE — Telephone Encounter (Signed)
Informed Pt that note for jury duty excuse is ready for p/u.

## 2011-04-08 NOTE — Telephone Encounter (Signed)
I wrote this note, it is in your box for her to pick up
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
## 2011-04-15 ENCOUNTER — Telehealth: Payer: Self-pay | Admitting: Family Medicine

## 2011-04-15 NOTE — Telephone Encounter (Signed)
On Oxycontin 10mg  with no Tylenol in it. Pt fell 2 days ago and is in pain. She would like dosage increased.

## 2011-04-15 NOTE — Telephone Encounter (Signed)
Pt called back to check on status. Pt in a lot of pain and said that if Dr Clent Ridges can't do change dosage, pt would like to come in tomorrow for ov. Pls advise.

## 2011-04-15 NOTE — Telephone Encounter (Signed)
Pt call back checking on status. Pt is aware waiting on MD

## 2011-04-15 NOTE — Telephone Encounter (Signed)
Since she fell I would need to evaluate her first

## 2011-04-16 ENCOUNTER — Encounter: Payer: Self-pay | Admitting: Family Medicine

## 2011-04-16 NOTE — Telephone Encounter (Signed)
Left message on machine for patient

## 2011-04-17 ENCOUNTER — Encounter: Payer: Self-pay | Admitting: Family Medicine

## 2011-04-17 ENCOUNTER — Ambulatory Visit (INDEPENDENT_AMBULATORY_CARE_PROVIDER_SITE_OTHER): Payer: Self-pay | Admitting: Family Medicine

## 2011-04-17 ENCOUNTER — Other Ambulatory Visit: Payer: Self-pay | Admitting: *Deleted

## 2011-04-17 VITALS — BP 130/98 | Temp 99.0°F | Wt 220.0 lb

## 2011-04-17 DIAGNOSIS — I10 Essential (primary) hypertension: Secondary | ICD-10-CM

## 2011-04-17 DIAGNOSIS — M545 Low back pain: Secondary | ICD-10-CM

## 2011-04-17 MED ORDER — HYDROMORPHONE HCL 4 MG PO TABS: ORAL_TABLET | ORAL | Status: DC

## 2011-04-17 MED ORDER — HYDROMORPHONE HCL 8 MG PO TABS: 8.0000 mg | ORAL_TABLET | Freq: Four times a day (QID) | ORAL | Status: DC | PRN

## 2011-04-17 MED ORDER — HALOBETASOL PROPIONATE 0.05 % EX CREA: TOPICAL_CREAM | Freq: Two times a day (BID) | CUTANEOUS | Status: AC
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
## 2011-04-17 NOTE — Telephone Encounter (Signed)
Pharmacy is calling because they only have 4 mg of dilaudid not 8mg .  Okay per Dr Clent Ridges.

## 2011-05-15 ENCOUNTER — Other Ambulatory Visit: Payer: Self-pay | Admitting: Family Medicine

## 2011-05-15 MED ORDER — HYDROMORPHONE HCL 8 MG PO TABS: ORAL_TABLET | ORAL | Status: DC

## 2011-05-15 NOTE — Telephone Encounter (Signed)
Can refill at current dose if due. Any discussion of dose increase should involve her pmd.

## 2011-05-15 NOTE — Telephone Encounter (Signed)
Pt is already on the 8mg  and is aware that rx will be ready in the am.

## 2011-05-15 NOTE — Telephone Encounter (Signed)
Pt called re: HYDROmorphone (DILAUDID) 8 mg. Pt says that she is having to take the whole 8 mg for pain. 4 mg is not strong enough. Pt is req refill of 8 mg Hydromorphone to be picked up on Friday. Refill due on 05/18/11. Pls call when ready for pick up.

## 2011-05-23 ENCOUNTER — Other Ambulatory Visit: Payer: Self-pay | Admitting: Family Medicine

## 2011-05-28 ENCOUNTER — Other Ambulatory Visit: Payer: Self-pay | Admitting: Family Medicine

## 2011-05-28 NOTE — Telephone Encounter (Signed)
Dr Clent Ridges pt, will route

## 2011-05-30 MED ORDER — TRIMETHOPRIM 100 MG PO TABS: 100.0000 mg | ORAL_TABLET | Freq: Two times a day (BID) | ORAL | Status: DC

## 2011-05-30 NOTE — Telephone Encounter (Signed)
Call in #60 with 11 rf 

## 2011-06-06 ENCOUNTER — Telehealth: Payer: Self-pay | Admitting: Family Medicine

## 2011-06-06 MED ORDER — CIPROFLOXACIN HCL 500 MG PO TABS: 500.0000 mg | ORAL_TABLET | Freq: Two times a day (BID) | ORAL | Status: AC

## 2011-06-06 NOTE — Telephone Encounter (Signed)
Pt is requesting a medication for a possible UTI. Symptoms include- burning while urinating and frequency x 2 days. She has no transportation right now and could not come in for a office visit. Please advise?

## 2011-06-06 NOTE — Telephone Encounter (Signed)
Call in Cipro 500 mg bid for 7 days  

## 2011-06-06 NOTE — Telephone Encounter (Signed)
rx sent to [facility].  Left message on machine for patient .

## 2011-06-13 ENCOUNTER — Telehealth: Payer: Self-pay | Admitting: Family Medicine

## 2011-06-13 NOTE — Telephone Encounter (Signed)
Refill Glucophage and (Trimethoprim 100mg  tablet bid. She is taking them more often lately, due to frequent intercourse). [facility] on [address].  Also needs Hydromorphone 8mg  to be picked up only by her.

## 2011-06-16 ENCOUNTER — Telehealth: Payer: Self-pay | Admitting: Family Medicine

## 2011-06-16 NOTE — Telephone Encounter (Signed)
Pt requesting refill request for Xanax

## 2011-06-16 NOTE — Telephone Encounter (Signed)
Pt called again checking on the status. Requesting that a nurse calls when refills are ready. Thanks.

## 2011-06-16 NOTE — Telephone Encounter (Signed)
Pt called and is checking on status of refills for Glucophage, Trimethoprim, Hydromorphone. Pt also needs refill of Xanax 2 mg. Pt will pick up Hydromorphone when ready. The other meds needs to be called in to [facility] on [address].

## 2011-06-16 NOTE — Telephone Encounter (Signed)
Pt requesting refills for Xanax 2 mg, pt last here on 04/17/11 and last filled on 12/26/10, also Dilaudid 8 mg and last filled on 05/15/11.

## 2011-06-16 NOTE — Telephone Encounter (Deleted)
I called in script for Glucophage 1000 mg take 1 po bid # 60 with 3 refills. Pt had refills for the Trimpex and pharmacy will fill both.

## 2011-06-16 NOTE — Telephone Encounter (Signed)
I called in Glucophage 1000 mg take 1 po bid # 60 and 3 refills. Also pharmacy had refills for the Trimpex  And is going to fill for pt. I spoke with pt and gave info. The other 2 scripts will have to be approved by the doctor.

## 2011-06-17 MED ORDER — HYDROMORPHONE HCL 8 MG PO TABS: ORAL_TABLET | ORAL | Status: DC

## 2011-06-17 MED ORDER — ALPRAZOLAM 2 MG PO TABS: 2.0000 mg | ORAL_TABLET | Freq: Four times a day (QID) | ORAL | Status: DC

## 2011-06-17 NOTE — Telephone Encounter (Signed)
done

## 2011-06-24 ENCOUNTER — Other Ambulatory Visit: Payer: Self-pay | Admitting: Family Medicine

## 2011-06-25 NOTE — Telephone Encounter (Signed)
Refill request for Alprazolam, pt last here on 04/17/11 and script last filled on 05/28/11.

## 2011-06-26 NOTE — Telephone Encounter (Signed)
I called pharmacy and script was filled today.

## 2011-07-10 ENCOUNTER — Other Ambulatory Visit: Payer: Self-pay | Admitting: Family Medicine

## 2011-07-10 NOTE — Telephone Encounter (Signed)
Refill Hydromorphone 8 mg 1q6hs prn pain. Thanks.

## 2011-07-11 ENCOUNTER — Telehealth: Payer: Self-pay | Admitting: Family Medicine

## 2011-07-11 NOTE — Telephone Encounter (Signed)
Yes I will continue to prescribe it for her but she cannot take more than she is supposed to. We need to follow strict guidelines.

## 2011-07-11 NOTE — Telephone Encounter (Signed)
Duplicate note

## 2011-07-11 NOTE — Telephone Encounter (Signed)
Pt called to check on status of getting refill of Hydromorphone 8 mg  1q6hs prn pain. Pt would like to be notified when this has been sent to pharmacy today.

## 2011-07-11 NOTE — Telephone Encounter (Signed)
No refills yet. She is one week early. Cannot give this until 07-18-11

## 2011-07-11 NOTE — Telephone Encounter (Signed)
Pt called 8/10 wanting a refill on her Hydromorphone. I see it was filled 7/17 for 120 qty and has no refills. I told her simply that I would pass the message along. She wanted a "yes" or "no" from me on whether Dr. Clent Ridges was going to continue to give it to her. I told her I could not answer that.

## 2011-07-14 ENCOUNTER — Telehealth: Payer: Self-pay | Admitting: Family Medicine

## 2011-07-14 NOTE — Telephone Encounter (Signed)
Left voice message for pt to return my call.

## 2011-07-14 NOTE — Telephone Encounter (Signed)
Left voice message for pt to return my call. See previous note.

## 2011-07-14 NOTE — Telephone Encounter (Signed)
Pt has had nausea and diarrhea for 2 days. Pt having chills. Pt can't eat solid food. Pt req work in ov with Dr Clent Ridges or a med called in to [facility] on [address].

## 2011-07-14 NOTE — Telephone Encounter (Signed)
No, the nausea and diarrhea should not be coming from not taking the hydromorphone. She may have a GI virus. If she does not have a fever, call in Phenergan 25 mg tabs q 4 hours prn nausea, #60 with 2 rf and also tell her to use Imodium AD for the diarrhea. If this does not work or if she has a fever, she needs to come in to be seen

## 2011-07-14 NOTE — Telephone Encounter (Signed)
Pt called back again to check on getting a med called in for nausea and diarrhea. Pt not able to eat and is diabetic. Pt req call back today.

## 2011-07-14 NOTE — Telephone Encounter (Signed)
Pt called in again. I made her aware of Dr. Claris Che last note & told her she could not refill until 8/17. Pt states that everything she eats goes right through her. She wants to know if this is a side effect of stopping the Hydromorphone "cold [place]". She would like someone to call her.

## 2011-07-14 NOTE — Telephone Encounter (Signed)
I already sent this to the doctor. This is a duplicate.

## 2011-07-14 NOTE — Telephone Encounter (Signed)
See the other note today

## 2011-07-15 NOTE — Telephone Encounter (Signed)
Pt did not call back

## 2011-07-16 ENCOUNTER — Telehealth: Payer: Self-pay | Admitting: Family Medicine

## 2011-07-16 MED ORDER — PROMETHAZINE HCL 25 MG PO TABS: 25.0000 mg | ORAL_TABLET | ORAL | Status: DC | PRN

## 2011-07-16 NOTE — Telephone Encounter (Signed)
Spoke with pt and she does not have a fever. I called in the script and pt aware.

## 2011-07-18 ENCOUNTER — Telehealth: Payer: Self-pay | Admitting: Family Medicine

## 2011-07-18 ENCOUNTER — Other Ambulatory Visit: Payer: Self-pay | Admitting: Family Medicine

## 2011-07-18 MED ORDER — HYDROMORPHONE HCL 8 MG PO TABS: ORAL_TABLET | ORAL | Status: DC

## 2011-07-18 NOTE — Telephone Encounter (Signed)
Pt is in a lot of pain and is requesting a refill on HYDROmorphone (DILAUDID) 8 MG tablet and trimethoprim (TRIMPEX) 100 MG tablet   She would like prescription of Trimethoprim sent to  [facility] on [address]  Please Contact Pt.

## 2011-07-18 NOTE — Telephone Encounter (Signed)
Left voice message for pt. Script ready for pick up and I called in the Trimpex.

## 2011-07-18 NOTE — Telephone Encounter (Signed)
done

## 2011-08-27 LAB — POCT URINALYSIS DIP (DEVICE)
Bilirubin Urine: NEGATIVE
Ketones, ur: NEGATIVE
Operator id: 282151
Specific Gravity, Urine: 1.01

## 2011-09-02 LAB — GLUCOSE, CAPILLARY
Glucose-Capillary: 115 — ABNORMAL HIGH
Glucose-Capillary: 118 — ABNORMAL HIGH

## 2011-09-05 LAB — DIFFERENTIAL
Eosinophils Absolute: 0.2 10*3/uL (ref 0.0–0.7)
Lymphocytes Relative: 32 % (ref 12–46)
Lymphs Abs: 2.3 10*3/uL (ref 0.7–4.0)
Monocytes Relative: 8 % (ref 3–12)
Neutro Abs: 4.1 10*3/uL (ref 1.7–7.7)
Neutrophils Relative %: 57 % (ref 43–77)

## 2011-09-05 LAB — POCT I-STAT, CHEM 8
Chloride: 98 mEq/L (ref 96–112)
Creatinine, Ser: 0.7 mg/dL (ref 0.4–1.2)
Hemoglobin: 16.3 g/dL — ABNORMAL HIGH (ref 12.0–15.0)
Potassium: 3.2 mEq/L — ABNORMAL LOW (ref 3.5–5.1)
Sodium: 141 mEq/L (ref 135–145)

## 2011-09-05 LAB — CBC
Platelets: 71 10*3/uL — ABNORMAL LOW (ref 150–400)
RBC: 4.36 MIL/uL (ref 3.87–5.11)
WBC: 7.2 10*3/uL (ref 4.0–10.5)

## 2011-09-12 ENCOUNTER — Other Ambulatory Visit: Payer: Self-pay | Admitting: Family Medicine

## 2011-09-23 ENCOUNTER — Telehealth: Payer: Self-pay | Admitting: Family Medicine

## 2011-09-23 NOTE — Telephone Encounter (Signed)
Pt is wondering if Dr Clent Ridges could prescribe a diff pain med that does not req a written script for pt to pick up at office. Pt is req a pain med to be called in to [facility] on [address]. Pt wants a pain med that is as close as possible to original pain med, without a written script. Pt can not take any med that has tylenol in it,because of liver, or any hyrdocodone, because pt says that this med does not work.

## 2011-09-23 NOTE — Telephone Encounter (Signed)
She needs an OV to sit down and go through all this

## 2011-09-23 NOTE — Telephone Encounter (Signed)
Refill Hydromorphone. Please make sure to call her directly, not her husband, regarding her meds. Having depression. Wants something mild to take to help her sleep and to cope with daily life issues. Unable to come to see Dr Clent Ridges. Financial hardship. Please return call with advice.   [facility].Thanks.

## 2011-09-24 ENCOUNTER — Telehealth: Payer: Self-pay | Admitting: *Deleted

## 2011-09-24 ENCOUNTER — Ambulatory Visit (INDEPENDENT_AMBULATORY_CARE_PROVIDER_SITE_OTHER): Payer: BC Managed Care – PPO | Admitting: Family Medicine

## 2011-09-24 ENCOUNTER — Encounter: Payer: Self-pay | Admitting: Family Medicine

## 2011-09-24 VITALS — BP 122/70 | HR 78 | Temp 98.5°F | Wt 204.0 lb

## 2011-09-24 DIAGNOSIS — F418 Other specified anxiety disorders: Secondary | ICD-10-CM

## 2011-09-24 DIAGNOSIS — F341 Dysthymic disorder: Secondary | ICD-10-CM

## 2011-09-24 DIAGNOSIS — E119 Type 2 diabetes mellitus without complications: Secondary | ICD-10-CM

## 2011-09-24 DIAGNOSIS — I10 Essential (primary) hypertension: Secondary | ICD-10-CM

## 2011-09-24 DIAGNOSIS — D61818 Other pancytopenia: Secondary | ICD-10-CM

## 2011-09-24 LAB — CBC WITH DIFFERENTIAL/PLATELET
Basophils Absolute: 0 10*3/uL (ref 0.0–0.1)
Lymphocytes Relative: 24.9 % (ref 12.0–46.0)
Monocytes Relative: 6.8 % (ref 3.0–12.0)
Neutrophils Relative %: 65.5 % (ref 43.0–77.0)
Platelets: 45 10*3/uL — CL (ref 150.0–400.0)
RDW: 16.4 % — ABNORMAL HIGH (ref 11.5–14.6)

## 2011-09-24 LAB — LIPID PANEL
Cholesterol: 191 mg/dL (ref 0–200)
LDL Cholesterol: 113 mg/dL — ABNORMAL HIGH (ref 0–99)
Total CHOL/HDL Ratio: 3
Triglycerides: 96 mg/dL (ref 0.0–149.0)
VLDL: 19.2 mg/dL (ref 0.0–40.0)

## 2011-09-24 LAB — HEPATIC FUNCTION PANEL
Albumin: 3.4 g/dL — ABNORMAL LOW (ref 3.5–5.2)
Bilirubin, Direct: 0.7 mg/dL — ABNORMAL HIGH (ref 0.0–0.3)
Total Protein: 7.3 g/dL (ref 6.0–8.3)

## 2011-09-24 LAB — BASIC METABOLIC PANEL
BUN: 10 mg/dL (ref 6–23)
CO2: 27 mEq/L (ref 19–32)
Chloride: 103 mEq/L (ref 96–112)
Potassium: 4 mEq/L (ref 3.5–5.1)

## 2011-09-24 LAB — HEMOGLOBIN A1C: Hgb A1c MFr Bld: 4.9 % (ref 4.6–6.5)

## 2011-09-24 LAB — TSH: TSH: 2.04 u[IU]/mL (ref 0.35–5.50)

## 2011-09-24 MED ORDER — DULOXETINE HCL 60 MG PO CPEP: 60.0000 mg | ORAL_CAPSULE | Freq: Every day | ORAL | Status: DC

## 2011-09-24 MED ORDER — HYDROMORPHONE HCL 8 MG PO TABS: ORAL_TABLET | ORAL | Status: DC

## 2011-09-24 MED ORDER — ZOLPIDEM TARTRATE 10 MG PO TABS: 10.0000 mg | ORAL_TABLET | Freq: Every evening | ORAL | Status: AC | PRN

## 2011-09-24 NOTE — Telephone Encounter (Signed)
[facility] called and pts platlets are [id number].  Clydie Braun is going to fax labs over

## 2011-09-24 NOTE — Telephone Encounter (Signed)
Pt called back checking on status of getting diff pain med. Pt was informed that Dr Clent Ridges said that she needs to come in for OV to discuss. Pt req to come in for ov today and schd to come in today at 11am, but said she would call back is she could not come in for appt.

## 2011-09-25 ENCOUNTER — Encounter: Payer: Self-pay | Admitting: Family Medicine
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
## 2011-09-25 NOTE — Telephone Encounter (Signed)
noted 

## 2011-09-26 ENCOUNTER — Telehealth: Payer: Self-pay | Admitting: Family Medicine

## 2011-09-26 NOTE — Progress Notes (Signed)
Addended by: Gershon Crane A on: 09/26/2011 10:50 AM   Modules accepted: Orders

## 2011-09-26 NOTE — Telephone Encounter (Signed)
Message copied by Baldemar Friday on Fri Sep 26, 2011 12:32 PM ------      Message from: Gershon Crane A      Created: Fri Sep 26, 2011 10:48 AM       Normal except for her chronically elevated liver enzymes (she has cirrhosis) and these are stable. Also all her blood cell types are low, including WBC and RBC and platelets. We will refer her to Hematology for this.

## 2011-09-26 NOTE — Telephone Encounter (Signed)
Spoke with pt and gave results. 

## 2011-09-29 ENCOUNTER — Telehealth: Payer: Self-pay | Admitting: Family Medicine

## 2011-09-29 NOTE — Telephone Encounter (Signed)
Pt called and is req call back asap re: referral to [facility]. Pls call back asap today.

## 2011-09-29 NOTE — Telephone Encounter (Signed)
Has questions regarding lab results for white and red blood cells. Pt all had questions regarding referral pt was informed that she should be contact directly bu the cancer center to schedule appt. Pt requesting you contact here asap

## 2011-10-07 ENCOUNTER — Other Ambulatory Visit: Payer: Self-pay | Admitting: Family Medicine

## 2011-10-09 ENCOUNTER — Encounter: Payer: Self-pay | Admitting: *Deleted

## 2011-10-16 ENCOUNTER — Telehealth: Payer: Self-pay | Admitting: Oncology

## 2011-10-16 NOTE — Telephone Encounter (Signed)
Pt called and r/s 11/16 appt to 11/20 @ 1:30 pm.  Appt converted from mosaiq to EPIC.

## 2011-10-21 ENCOUNTER — Ambulatory Visit (HOSPITAL_BASED_OUTPATIENT_CLINIC_OR_DEPARTMENT_OTHER): Payer: BC Managed Care – PPO | Admitting: Oncology

## 2011-10-21 ENCOUNTER — Ambulatory Visit (HOSPITAL_BASED_OUTPATIENT_CLINIC_OR_DEPARTMENT_OTHER): Payer: BC Managed Care – PPO

## 2011-10-21 ENCOUNTER — Telehealth: Payer: Self-pay | Admitting: Family Medicine

## 2011-10-21 DIAGNOSIS — D61818 Other pancytopenia: Secondary | ICD-10-CM

## 2011-10-21 DIAGNOSIS — D649 Anemia, unspecified: Secondary | ICD-10-CM

## 2011-10-21 DIAGNOSIS — K7460 Unspecified cirrhosis of liver: Secondary | ICD-10-CM

## 2011-10-21 DIAGNOSIS — D6959 Other secondary thrombocytopenia: Secondary | ICD-10-CM

## 2011-10-21 LAB — CBC WITH DIFFERENTIAL/PLATELET
BASO%: 0.2 % (ref 0.0–2.0)
Basophils Absolute: 0 10*3/uL (ref 0.0–0.1)
HCT: 32.4 % — ABNORMAL LOW (ref 34.8–46.6)
HGB: 10.9 g/dL — ABNORMAL LOW (ref 11.6–15.9)
MCHC: 33.6 g/dL (ref 31.5–36.0)
MONO#: 0.3 10*3/uL (ref 0.1–0.9)
NEUT#: 2.5 10*3/uL (ref 1.5–6.5)
NEUT%: 61.5 % (ref 38.4–76.8)
WBC: 4.1 10*3/uL (ref 3.9–10.3)
lymph#: 1.1 10*3/uL (ref 0.9–3.3)

## 2011-10-21 LAB — COMPREHENSIVE METABOLIC PANEL
AST: 64 U/L — ABNORMAL HIGH (ref 0–37)
Albumin: 3.2 g/dL — ABNORMAL LOW (ref 3.5–5.2)
Alkaline Phosphatase: 231 U/L — ABNORMAL HIGH (ref 39–117)
Glucose, Bld: 109 mg/dL — ABNORMAL HIGH (ref 70–99)
Potassium: 3.1 mEq/L — ABNORMAL LOW (ref 3.5–5.3)
Sodium: 137 mEq/L (ref 135–145)
Total Protein: 6.4 g/dL (ref 6.0–8.3)

## 2011-10-21 LAB — POCT URINALYSIS DIPSTICK
Glucose, UA: NEGATIVE
Nitrite, UA: NEGATIVE
Urobilinogen, UA: >=8

## 2011-10-21 LAB — MICROALBUMIN / CREATININE URINE RATIO: Creatinine,U: 55.2 mg/dL

## 2011-10-21 NOTE — Progress Notes (Signed)
Note dictated

## 2011-10-21 NOTE — Progress Notes (Signed)
Addended by: Rita Ohara R on: 10/21/2011 03:01 PM   Modules accepted: Orders

## 2011-10-21 NOTE — Telephone Encounter (Signed)
She cannot take Zoloft and Cymbalta together. Increase the Cymbalta to 60 mg bid . Please give her some samples

## 2011-10-21 NOTE — Telephone Encounter (Signed)
I spoke with pt and gave new directions for Cymbalta, we did not have any samples.

## 2011-10-21 NOTE — Telephone Encounter (Signed)
Walk-in---Was on Cymbalta 60mg  iqd samples. Pt states that the medicine is not strong enough. She still cries. Pt is here crying now. Requesting a higher sample of Cymbalta. Also, she wants to try the Zoloft again. She states that she has no energy. Please advise. Thanks.

## 2011-10-21 NOTE — Progress Notes (Signed)
CC:   Dana Petersen. Dana Ridges, MD  REASON FOR CONSULTATION:  Thrombocytopenia and anemia.  HISTORY OF PRESENT ILLNESS:  A 45 year old woman who I saw for the first time back in September of 2009 for evaluation of thrombocytopenia.  At that time her platelet counts have ranged between 100-75,000.  At that time I felt this was probably related to her history of hepatic cirrhosis.  She has not been seen in clinic since that time.  After she had failed to follow up with me as scheduled 3 months later.  She has been followed with Dr. Clent Petersen and had a myriad of complaints predominantly including depression and anxiety and she has had some back pain as well. Most recently here she had a CBC on October 24 that showed that her hemoglobin was 11.7, white cell count of 4.2, platelet count of 45,000. She was referred to me for reestablishment to reassess her pancytopenia. Upon interviewing Dana Petersen she has had some complaints including lower extremity swelling, some occasional abdominal distention.  She had not had any bleeding, had not reported any epistaxis, had not reported any hematochezia, had not reported any melena.  REVIEW OF SYSTEMS:  Did not report of headaches, blurry vision, double vision.  Did not report any motor or sensory neuropathy.  Did not report any alteration in mental status, psychiatric issues, depression.  Did not report any fever, chills, sweats.  Did not report any cough, hemoptysis, hematemesis.  Did not report any nausea or vomiting.  No abdominal pain.  No hematochezia, melena.  Rest of review of systems unremarkable.  MEDICATIONS:  Reviewed today.  She is on Xanax, Catapres, Cymbalta, Lasix, Ultravate, Dilaudid, Glucophage, potassium, Phenergan, Onglyza, Aldactone, triamterene and Ambien.  ALLERGIES:  None.  PHYSICAL EXAMINATION:  General:  Alert, awake female, appeared in no active distress.  Vital signs:  Blood pressure today is 124/64, pulse 93, respirations 20,  temperature is 97.  HEENT:  Head is normocephalic, atraumatic.  Pupils equal, round, reactive to light.  Oral mucosa moist and pink.  Neck:  Supple without adenopathy.  Heart:  Regular rate, S1- S2.  Lungs:  Clear to auscultation.  Abdomen:  Slightly distended.  Did not appreciate any ascites.  Extremities:  Had 2+ lower extremity edema.  Her hemoglobin is 10.9, white cell count of 4.1 which is normal, platelet count of 44, RDW 15.1.  Peripheral smear was personally reviewed today and showed her white cells actually appeared normal, appeared adequate in number and size.  I could not appreciate any dysplasia or schistocytosis, could not appreciate any blasts.  The red cells appeared just slightly normal hypochromic in nature and microcytic as well.  Platelet counts appeared slightly decreased.  No platelet clumping at this time.  ASSESSMENT AND PLAN:  A 45 year old woman with the following issues: 1. 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  I doubt     that she has a hematological disorder at this point.  I think this     is all stems to her liver disease.  Fortunately she has not had any     bleeding problems, had not had any epistaxis and in terms of     management really until her liver disease is corrected which  probably short of a liver transplant I do not think that will     correct her platelet counts, really supportive care is the only     treatment.  I would recommend platelet transfusion if she has any     bleeding or if she has any major procedures. 2. Anemia; slightly microcytic although her MCV is within normal range     at 95.  Probably has a mixed population microcytic due to her     possibly iron deficiency versus macro in nature due to her liver      disease.  Again, her anemia is rather mild, I think it does not     represent a hematological disorder rather than reflection of her     overall liver disease and possibly iron deficiency. 3. Leukocytosis; actually her white cell count is perfectly normal     today.  She has perfectly normal smear.  I do not think she really     needs a bone marrow biopsy at this time.  RECOMMENDATION AND MANAGEMENT:  At this point I will have her follow up in about 3-4 months' time.  Repeat her blood counts.  Also recommend she be evaluated by hepatology given possibly she has advanced liver disease at this time.  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    ______________________________ Dana Petersen, M.D. FNS/MEDQ  D:  10/21/2011  T:  10/21/2011  Job:  [id number]

## 2011-10-22 ENCOUNTER — Other Ambulatory Visit: Payer: Self-pay | Admitting: Family Medicine

## 2011-11-03 NOTE — Telephone Encounter (Addendum)
Pt need refill metformin 1000mg  sent to [facility] [phone]. Pt no longer use [facility] Pt is out of med

## 2011-11-05 MED ORDER — METFORMIN HCL 1000 MG PO TABS: 1000.0000 mg | ORAL_TABLET | Freq: Two times a day (BID) | ORAL | Status: DC

## 2011-11-05 NOTE — Telephone Encounter (Signed)
Script sent e-scribe 

## 2011-12-04 ENCOUNTER — Telehealth: Payer: Self-pay | Admitting: Family Medicine

## 2011-12-04 MED ORDER — AZITHROMYCIN 250 MG PO TABS: ORAL_TABLET | ORAL | Status: AC

## 2011-12-04 MED ORDER — AZITHROMYCIN 250 MG PO TABS: ORAL_TABLET | ORAL | Status: DC

## 2011-12-04 NOTE — Telephone Encounter (Signed)
Call in a Zpack  ?

## 2011-12-04 NOTE — Telephone Encounter (Signed)
Pt called and said that the azithromycin (ZITHROMAX) 250 MG tablet needs to be sent to [facility] on [address] [phone] .  Pls resend script to correct pharmacy.   Pt called and said that she needs a new script for metFORMIN (GLUCOPHAGE) 1000 MG called in to [facility] on [address] also, because her pharmacy told her that there are no refills remaining on her current script.

## 2011-12-04 NOTE — Telephone Encounter (Signed)
Rxfor azithromycin sent to [facility] on [address].

## 2011-12-04 NOTE — Telephone Encounter (Signed)
Pt called again to check on status of call. Please advice. Thanks.

## 2011-12-04 NOTE — Telephone Encounter (Signed)
Rx sent to pharmacy.  Left a message for pt rx sent to pharmacy.

## 2011-12-04 NOTE — Telephone Encounter (Signed)
Pt has flu and cough/chills and no fever. Pt family had flu. Pt is requesting med call into [facility] [phone]. Pt is aware doc out of office this afternoon

## 2011-12-04 NOTE — Telephone Encounter (Signed)
Pt is requesting sylvia to call her once rx has been called into pharm

## 2011-12-05 MED ORDER — METFORMIN HCL 1000 MG PO TABS: 1000.0000 mg | ORAL_TABLET | Freq: Two times a day (BID) | ORAL | Status: DC

## 2011-12-05 NOTE — Telephone Encounter (Signed)
Scripts were sent in. 

## 2011-12-05 NOTE — Telephone Encounter (Signed)
Call in a Zpack and also one year supply of Metformin

## 2011-12-10 ENCOUNTER — Other Ambulatory Visit: Payer: Self-pay | Admitting: Family Medicine

## 2011-12-11 NOTE — Telephone Encounter (Signed)
Pt last seen 10/21/11. Pls advise on Clonidine.

## 2011-12-12 ENCOUNTER — Telehealth: Payer: Self-pay | Admitting: *Deleted

## 2011-12-12 MED ORDER — DULOXETINE HCL 60 MG PO CPEP: 60.0000 mg | ORAL_CAPSULE | Freq: Two times a day (BID) | ORAL | Status: DC

## 2011-12-12 NOTE — Telephone Encounter (Signed)
Refill on cymbalta

## 2011-12-25 ENCOUNTER — Telehealth: Payer: Self-pay | Admitting: Family Medicine

## 2011-12-25 NOTE — Telephone Encounter (Signed)
Refill request from [facility] for Cipro 500 mg take 1 po bid.

## 2011-12-26 NOTE — Telephone Encounter (Signed)
I spoke with pt and she did not request any refills.

## 2011-12-26 NOTE — Telephone Encounter (Signed)
She would need an OV for this  

## 2011-12-27 ENCOUNTER — Other Ambulatory Visit: Payer: Self-pay | Admitting: Family Medicine

## 2011-12-29 NOTE — Telephone Encounter (Addendum)
Pt also needs timethoprim 100mg  and metformin 1000mg . Pt is out of xanax

## 2011-12-30 ENCOUNTER — Telehealth: Payer: Self-pay | Admitting: Family Medicine

## 2011-12-30 NOTE — Telephone Encounter (Signed)
Script called in for Xanax and the other 2 scripts that pt requested had refills remaining.

## 2011-12-30 NOTE — Telephone Encounter (Signed)
Pt is still waiting on xanax refill

## 2011-12-30 NOTE — Telephone Encounter (Signed)
Call in #[phone] with 5 rf 

## 2012-01-16 ENCOUNTER — Ambulatory Visit: Payer: BC Managed Care – PPO | Admitting: Family Medicine

## 2012-01-19 ENCOUNTER — Other Ambulatory Visit: Payer: Self-pay | Admitting: Family Medicine

## 2012-01-20 ENCOUNTER — Ambulatory Visit (INDEPENDENT_AMBULATORY_CARE_PROVIDER_SITE_OTHER): Payer: BC Managed Care – PPO | Admitting: Family Medicine

## 2012-01-20 ENCOUNTER — Telehealth: Payer: Self-pay | Admitting: Family Medicine

## 2012-01-20 ENCOUNTER — Encounter: Payer: Self-pay | Admitting: Family Medicine

## 2012-01-20 VITALS — BP 126/70 | HR 94 | Temp 98.7°F | Wt 226.0 lb

## 2012-01-20 DIAGNOSIS — M719 Bursopathy, unspecified: Secondary | ICD-10-CM

## 2012-01-20 DIAGNOSIS — M7552 Bursitis of left shoulder: Secondary | ICD-10-CM

## 2012-01-20 MED ORDER — HYDROMORPHONE HCL 8 MG PO TABS: ORAL_TABLET | ORAL | Status: DC

## 2012-01-20 MED ORDER — PREDNISONE (PAK) 10 MG PO TABS: 10.0000 mg | ORAL_TABLET | Freq: Every day | ORAL | Status: AC

## 2012-01-20 MED ORDER — FUROSEMIDE 40 MG PO TABS: 40.0000 mg | ORAL_TABLET | Freq: Three times a day (TID) | ORAL | Status: DC

## 2012-01-20 NOTE — Telephone Encounter (Signed)
I spoke to pharmacy

## 2012-01-20 NOTE — Progress Notes (Signed)
  Subjective:    Patient ID: Dana Petersen, female    DOB: 12-Sep-1966, 46 y.o.   MRN: [id number]  HPI Here for 2 weeks of pain in the anterior left shoulder. No trauma that she knows of. No pain down the arm. She takes Dilaudid as usual for her low back pain.    Review of Systems  Constitutional: Negative.   Musculoskeletal: Positive for back pain and arthralgias.       Objective:   Physical Exam  Constitutional: She appears well-developed and well-nourished.  Musculoskeletal:       The left shoulder is very tender in the subacromial area. ROM is decreased due to pain. No crepitus           Assessment & Plan:  Rest, ice packs. Use the steroid taper pack. Recheck prn

## 2012-01-20 NOTE — Telephone Encounter (Signed)
Patient called stating she is at the pharmacy and her furosemide and prednisone is not there. Please assist and inform patient when available.

## 2012-01-20 NOTE — Progress Notes (Signed)
Addended by: Aniceto Boss A on: 01/20/2012 02:00 PM   Modules accepted: Orders

## 2012-01-22 ENCOUNTER — Other Ambulatory Visit: Payer: Self-pay | Admitting: Family Medicine

## 2012-02-18 ENCOUNTER — Ambulatory Visit: Payer: BC Managed Care – PPO | Admitting: Oncology

## 2012-02-18 ENCOUNTER — Other Ambulatory Visit: Payer: BC Managed Care – PPO | Admitting: Lab

## 2012-03-24 ENCOUNTER — Emergency Department (HOSPITAL_BASED_OUTPATIENT_CLINIC_OR_DEPARTMENT_OTHER)
Admission: EM | Admit: 2012-03-24 | Discharge: 2012-03-24 | Disposition: A | Discharge status: Home / Self Care | Payer: BC Managed Care – PPO | Attending: Emergency Medicine | Admitting: Emergency Medicine

## 2012-03-24 ENCOUNTER — Encounter (HOSPITAL_BASED_OUTPATIENT_CLINIC_OR_DEPARTMENT_OTHER): Payer: Self-pay | Admitting: Emergency Medicine

## 2012-03-24 ENCOUNTER — Telehealth: Payer: Self-pay | Admitting: Family Medicine

## 2012-03-24 DIAGNOSIS — E119 Type 2 diabetes mellitus without complications: Secondary | ICD-10-CM | POA: Insufficient documentation

## 2012-03-24 DIAGNOSIS — K7460 Unspecified cirrhosis of liver: Secondary | ICD-10-CM | POA: Insufficient documentation

## 2012-03-24 DIAGNOSIS — F329 Major depressive disorder, single episode, unspecified: Secondary | ICD-10-CM | POA: Insufficient documentation

## 2012-03-24 DIAGNOSIS — R6883 Chills (without fever): Secondary | ICD-10-CM | POA: Insufficient documentation

## 2012-03-24 DIAGNOSIS — R197 Diarrhea, unspecified: Secondary | ICD-10-CM

## 2012-03-24 DIAGNOSIS — Z79899 Other long term (current) drug therapy: Secondary | ICD-10-CM | POA: Insufficient documentation

## 2012-03-24 DIAGNOSIS — I10 Essential (primary) hypertension: Secondary | ICD-10-CM | POA: Insufficient documentation

## 2012-03-24 DIAGNOSIS — R109 Unspecified abdominal pain: Secondary | ICD-10-CM | POA: Insufficient documentation

## 2012-03-24 DIAGNOSIS — G40909 Epilepsy, unspecified, not intractable, without status epilepticus: Secondary | ICD-10-CM | POA: Insufficient documentation

## 2012-03-24 DIAGNOSIS — K219 Gastro-esophageal reflux disease without esophagitis: Secondary | ICD-10-CM | POA: Insufficient documentation

## 2012-03-24 DIAGNOSIS — F3289 Other specified depressive episodes: Secondary | ICD-10-CM | POA: Insufficient documentation

## 2012-03-24 LAB — URINALYSIS, ROUTINE W REFLEX MICROSCOPIC
Glucose, UA: NEGATIVE mg/dL
Hgb urine dipstick: NEGATIVE
Ketones, ur: NEGATIVE mg/dL
Protein, ur: NEGATIVE mg/dL

## 2012-03-24 LAB — COMPREHENSIVE METABOLIC PANEL
ALT: 42 U/L — ABNORMAL HIGH (ref 0–35)
AST: 68 U/L — ABNORMAL HIGH (ref 0–37)
CO2: 24 mEq/L (ref 19–32)
Calcium: 8.5 mg/dL (ref 8.4–10.5)
GFR calc non Af Amer: >90 mL/min (ref >90)
Sodium: 137 mEq/L (ref 135–145)

## 2012-03-24 LAB — DIFFERENTIAL
Basophils Relative: 0 % (ref 0–1)
Eosinophils Absolute: 0.1 10*3/uL (ref 0.0–0.7)
Lymphs Abs: 0.9 10*3/uL (ref 0.7–4.0)
Monocytes Absolute: 0.3 10*3/uL (ref 0.1–1.0)
Neutro Abs: 2.8 10*3/uL (ref 1.7–7.7)
Neutrophils Relative %: 67 % (ref 43–77)

## 2012-03-24 LAB — URINE MICROSCOPIC-ADD ON

## 2012-03-24 LAB — CBC
HCT: 36.6 % (ref 36.0–46.0)
Hemoglobin: 12.7 g/dL (ref 12.0–15.0)
MCH: 32.4 pg (ref 26.0–34.0)
MCHC: 34.7 g/dL (ref 30.0–36.0)

## 2012-03-24 MED ORDER — POTASSIUM CHLORIDE 20 MEQ/15ML (10%) PO LIQD
40.0000 meq | Freq: Every day | ORAL | Status: DC
  Administered 2012-03-24: 40 meq via ORAL
  Filled 2012-03-24: qty 30

## 2012-03-24 MED ORDER — OXYCODONE HCL 5 MG PO TABS: 5.0000 mg | ORAL_TABLET | ORAL | Status: AC | PRN

## 2012-03-24 MED ORDER — HYDROMORPHONE HCL PF 1 MG/ML IJ SOLN
0.5000 mg | Freq: Once | INTRAMUSCULAR | Status: AC
  Administered 2012-03-24: 0.5 mg via INTRAVENOUS
  Filled 2012-03-24: qty 1

## 2012-03-24 MED ORDER — SODIUM CHLORIDE 0.9 % IV SOLN
1000.0000 mL | Freq: Once | INTRAVENOUS | Status: AC
  Administered 2012-03-24: 1000 mL via INTRAVENOUS

## 2012-03-24 MED ORDER — ONDANSETRON HCL 4 MG/2ML IJ SOLN
4.0000 mg | Freq: Once | INTRAMUSCULAR | Status: AC
  Administered 2012-03-24: 4 mg via INTRAVENOUS
  Filled 2012-03-24: qty 2

## 2012-03-24 MED ORDER — SODIUM CHLORIDE 0.9 % IV SOLN
Freq: Once | INTRAVENOUS | Status: AC
  Administered 2012-03-24: 19:00:00 via INTRAVENOUS

## 2012-03-24 NOTE — Telephone Encounter (Signed)
Per Dr. Clent Ridges , and after I had talked to the pt, she needs to go to the ER for evaluation and treatment  She has not had fluids or food in several days.  She is not sure, and thinks she is running a fever, with chills. Expressed to pt how inportant it is for her to get to the ER for hydration, and she has agreed to do this.

## 2012-03-24 NOTE — Telephone Encounter (Signed)
Patient called stating that she has had flu-like symptoms for a week and she can not come in because the clutch is out in her vehicle and would like to know if something can be called into her pharmacy. Please advise.

## 2012-03-24 NOTE — ED Notes (Signed)
States for approximately 1 week has felt "bad" including vomiting last night, diarrhea, unsure of fever (+)chills, (+)body aches

## 2012-03-24 NOTE — ED Notes (Signed)
Reports she has been feeling bad for a week and last night she started having some diarrhea and nausea

## 2012-03-24 NOTE — ED Notes (Signed)
Patient ambulatory to the bathroom with assistance

## 2012-03-24 NOTE — Telephone Encounter (Signed)
agreed

## 2012-03-24 NOTE — Discharge Instructions (Signed)
B.R.A.T. Diet Your doctor has recommended the B.R.A.T. diet for you or your child until the condition improves. This is often used to help control diarrhea and vomiting symptoms. If you or your child can tolerate clear liquids, you may have:  Bananas.   Rice.   Applesauce.   Toast (and other simple starches such as crackers, potatoes, noodles).  Be sure to avoid dairy products, meats, and fatty foods until symptoms are better. Fruit juices such as apple, grape, and prune juice can make diarrhea worse. Avoid these. Continue this diet for 2 days or as instructed by your caregiver. Document Released: 11/17/2005 Document Revised: 11/06/2011 Document Reviewed: 05/06/2007 Exit[employer] Patient Information 2012 [employer]. 

## 2012-03-24 NOTE — ED Provider Notes (Signed)
History     CSN: [id number]  Arrival date & time 03/24/12  1626   First MD Initiated Contact with Patient 03/24/12 1655      Chief Complaint  Patient presents with  . Diarrhea    (Consider location/radiation/quality/duration/timing/severity/associated sxs/prior treatment) Patient is a 46 y.o. female presenting with diarrhea. The history is provided by the patient. No language interpreter was used.  Diarrhea The primary symptoms include diarrhea. The illness began more than 7 days ago. The onset was gradual. The problem has been gradually worsening.  The illness is also significant for chills. Significant associated medical issues include liver disease. Risk factors: chronic liver disease.    Past Medical History  Diagnosis Date  . ABNORMAL FINDINGS GI TRACT 09/12/2008  . ABNORMAL TRANSAMINASE-LFT'S 09/12/2008  . Acute bronchitis 07/21/2008  . Cirrhosis of liver without mention of alcohol 08/11/2008    sees Dr. Yancey Flemings  . DEPRESSIVE DISORDER, RCR, MODERATE 07/09/2007  . DIABETES MELLITUS, TYPE II 07/09/2007  . GERD 09/07/2008  . Headache 09/24/2010  . HIATAL HERNIA 09/07/2008  . HYPERTENSION 07/09/2007  . LOW BACK PAIN 07/02/2010  . Overweight 10/01/2007  . 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 Tubal ligation   . Carpal tunnel release   . Lumbar laminectomy     L5-S1  dr. Marianne Laina Guerrieri    Family History  Problem Relation Age of Onset  . Diabetes Neg Hx     family hx , 1st degree relative  . Hypertension Neg Hx     famliy  . Cancer Neg Hx     family hx - lung , breat, pancreatic ,ovarian    History  Substance Use Topics  . Smoking status: Current Everyday Smoker    Types: Cigarettes  . Smokeless tobacco: Never Used   Comment: less than  pack a day  . Alcohol Use: No    OB History    Grav Para Term Preterm Abortions TAB SAB Ect Mult Living                  Review of Systems  Constitutional: Positive for chills.    Gastrointestinal: Positive for diarrhea.  All other systems reviewed and are negative.    Allergies  Tylenol  Home Medications   Current Outpatient Rx  Name Route Sig Dispense Refill  . ALPRAZOLAM 2 MG PO TABS  TAKE 1 TABLET BY MOUTH EVERY 6 HOURS 120 tablet 5  . CLONIDINE HCL 0.1 MG PO TABS  TAKE 1 TABLET BY MOUTH TWICE DAILY 60 tablet 1  . FUROSEMIDE 40 MG PO TABS Oral Take 1 tablet (40 mg total) by mouth 3 (three) times daily. 90 tablet 11  . GLUCOSE BLOOD VI STRP Other 1 each by Other route daily. Use as instructed     . HALOBETASOL PROPIONATE 0.05 % EX CREA Topical Apply topically 2 (two) times daily. 50 g 5  . IBUPROFEN 200 MG PO TABS Oral Take 400 mg by mouth every 6 (six) hours as needed. Patient used this medication for body aches.    . METFORMIN HCL 1000 MG PO TABS Oral Take 1 tablet (1,000 mg total) by mouth 2 (two) times daily. 60 tablet 11  . PROMETHAZINE HCL 25 MG PO TABS Oral Take 1 tablet (25 mg total) by mouth every 4 (four) hours as needed. 60 tablet 2  . TRIMETHOPRIM 100 MG PO TABS  TAKE 1 TABLET BY MOUTH TWICE DAILY 60 tablet 0  BP 124/55  Pulse 79  Temp(Src) 97.6 F (36.4 C) (Oral)  Resp 16  Ht 5\' 4"  (1.626 m)  Wt 190 lb 8 oz (86.41 kg)  BMI 32.70 kg/m2  SpO2 100%  Physical Exam  Vitals reviewed. Constitutional: She is oriented to person, place, and time. She appears well-developed and well-nourished.  HENT:  Head: Normocephalic and atraumatic.  Eyes: Conjunctivae are normal. Pupils are equal, round, and reactive to light.  Neck: Normal range of motion. Neck supple.  Cardiovascular: Normal rate and normal heart sounds.   Pulmonary/Chest: Effort normal and breath sounds normal.  Abdominal: Soft. Bowel sounds are normal. There is tenderness.  Musculoskeletal: Normal range of motion.  Neurological: She is alert and oriented to person, place, and time. She has normal reflexes.  Skin: Skin is warm.  Psychiatric: She has a normal mood and affect.     ED Course  Procedures (including critical care time)  Labs Reviewed - No data to display No results found.   No diagnosis found.    MDM   Results for orders placed during the hospital encounter of 03/24/12  COMPREHENSIVE METABOLIC PANEL      Component Value Range   Sodium 137  135 - 145 (mEq/L)   Potassium 3.1 (*) 3.5 - 5.1 (mEq/L)   Chloride 100  96 - 112 (mEq/L)   CO2 24  19 - 32 (mEq/L)   Glucose, Bld 111 (*) 70 - 99 (mg/dL)   BUN 9  6 - 23 (mg/dL)   Creatinine, Ser 1.61  0.50 - 1.10 (mg/dL)   Calcium 8.5  8.4 - 09.6 (mg/dL)   Total Protein 7.3  6.0 - 8.3 (g/dL)   Albumin 3.4 (*) 3.5 - 5.2 (g/dL)   AST 68 (*) 0 - 37 (U/L)   ALT 42 (*) 0 - 35 (U/L)   Alkaline Phosphatase 203 (*) 39 - 117 (U/L)   Total Bilirubin 2.7 (*) 0.3 - 1.2 (mg/dL)   GFR calc non Af Amer >90  >90 (mL/min)   GFR calc Af Amer >90  >90 (mL/min)  CBC      Component Value Range   WBC 4.1  4.0 - 10.5 (K/uL)   RBC 3.92  3.87 - 5.11 (MIL/uL)   Hemoglobin 12.7  12.0 - 15.0 (g/dL)   HCT 04.5  40.9 - 81.1 (%)   MCV 93.4  78.0 - 100.0 (fL)   MCH 32.4  26.0 - 34.0 (pg)   MCHC 34.7  30.0 - 36.0 (g/dL)   RDW 91.4  78.2 - 95.6 (%)   Platelets 45 (*) 150 - 400 (K/uL)  DIFFERENTIAL      Component Value Range   Neutrophils Relative 67  43 - 77 (%)   Lymphocytes Relative 22  12 - 46 (%)   Monocytes Relative 8  3 - 12 (%)   Eosinophils Relative 3  0 - 5 (%)   Basophils Relative 0  0 - 1 (%)   Neutro Abs 2.8  1.7 - 7.7 (K/uL)   Lymphs Abs 0.9  0.7 - 4.0 (K/uL)   Monocytes Absolute 0.3  0.1 - 1.0 (K/uL)   Eosinophils Absolute 0.1  0.0 - 0.7 (K/uL)   Basophils Absolute 0.0  0.0 - 0.1 (K/uL)   Smear Review PLATELETS APPEAR DECREASED    URINALYSIS, ROUTINE W REFLEX MICROSCOPIC      Component Value Range   Color, Urine YELLOW  YELLOW    APPearance CLEAR  CLEAR    Specific Gravity, Urine 1.009  1.005 - 1.030    pH 7.0  5.0 - 8.0    Glucose, UA NEGATIVE  NEGATIVE (mg/dL)   Hgb urine dipstick NEGATIVE   NEGATIVE    Bilirubin Urine NEGATIVE  NEGATIVE    Ketones, ur NEGATIVE  NEGATIVE (mg/dL)   Protein, ur NEGATIVE  NEGATIVE (mg/dL)   Urobilinogen, UA >1.6 (*) 0.0 - 1.0 (mg/dL)   Nitrite NEGATIVE  NEGATIVE    Leukocytes, UA TRACE (*) NEGATIVE   LIPASE, BLOOD      Component Value Range   Lipase 67 (*) 11 - 59 (U/L)  URINE MICROSCOPIC-ADD ON      Component Value Range   Squamous Epithelial / LPF RARE  RARE    WBC, UA 0-2  <3 (WBC/hpf)   Bacteria, UA RARE  RARE    No results found.   Pt given Iv fluids x 2 liters,  Pt given zofran and dilaudid.  Pt given po potassium.   Labs compared to previous,  No change in lft's or platelets.   Pt advised that she needs to see Dr. Clent Ridges for recheck.   Vitals are stable.  Pt reports no pain medication.  Pt given rx for oxycodone #10.  Pt advised must see her Md for further pain medications       Lonia Skinner Sarah Ann, [place] 03/24/12 [phone]

## 2012-03-25 ENCOUNTER — Telehealth: Payer: Self-pay | Admitting: Family Medicine

## 2012-03-25 NOTE — ED Provider Notes (Signed)
Medical screening examination/treatment/procedure(s) were performed by non-physician practitioner and as supervising physician I was immediately available for consultation/collaboration.   Carleene Cooper III, MD 03/25/12 [phone]

## 2012-03-25 NOTE — Telephone Encounter (Signed)
Spoke with pt. She is going to take script to pharmacy where she normally gets it filled. I advised pt to come in and see the doctor and discuss the scripts being out of date order, she declined.

## 2012-03-25 NOTE — Telephone Encounter (Signed)
Pharmacist called and said that pt brought in script date 01/20/12, but on the script is says on of after 05/18/11 for HYDROmorphone (DILAUDID) 01/20/12.

## 2012-03-26 ENCOUNTER — Ambulatory Visit: Payer: BC Managed Care – PPO | Admitting: Family Medicine

## 2012-03-29 ENCOUNTER — Other Ambulatory Visit: Payer: Self-pay | Admitting: Family Medicine

## 2012-03-30 ENCOUNTER — Telehealth: Payer: Self-pay | Admitting: Family Medicine

## 2012-03-30 MED ORDER — CLONIDINE HCL 0.1 MG PO TABS: 0.1000 mg | ORAL_TABLET | Freq: Every day | ORAL | Status: DC

## 2012-03-30 NOTE — Telephone Encounter (Signed)
Pt requested refill for B/P medication and I sent e-scribe.

## 2012-03-30 NOTE — Telephone Encounter (Signed)
Pt will make an appt

## 2012-03-30 NOTE — Telephone Encounter (Signed)
Pt is out °

## 2012-04-20 ENCOUNTER — Telehealth: Payer: Self-pay | Admitting: Family Medicine

## 2012-04-20 NOTE — Telephone Encounter (Signed)
Opened in error

## 2012-04-23 ENCOUNTER — Encounter: Payer: Self-pay | Admitting: Family Medicine

## 2012-04-23 ENCOUNTER — Ambulatory Visit (INDEPENDENT_AMBULATORY_CARE_PROVIDER_SITE_OTHER): Payer: BC Managed Care – PPO | Admitting: Family Medicine

## 2012-04-23 VITALS — BP 160/94 | HR 127 | Temp 98.8°F | Wt 198.0 lb

## 2012-04-23 DIAGNOSIS — G8929 Other chronic pain: Secondary | ICD-10-CM

## 2012-04-23 DIAGNOSIS — F411 Generalized anxiety disorder: Secondary | ICD-10-CM

## 2012-04-23 DIAGNOSIS — G4700 Insomnia, unspecified: Secondary | ICD-10-CM

## 2012-04-23 DIAGNOSIS — M545 Low back pain: Secondary | ICD-10-CM

## 2012-04-23 DIAGNOSIS — F329 Major depressive disorder, single episode, unspecified: Secondary | ICD-10-CM

## 2012-04-23 DIAGNOSIS — I10 Essential (primary) hypertension: Secondary | ICD-10-CM

## 2012-04-23 DIAGNOSIS — F419 Anxiety disorder, unspecified: Secondary | ICD-10-CM

## 2012-04-23 MED ORDER — FLUOXETINE HCL 40 MG PO CAPS: 40.0000 mg | ORAL_CAPSULE | Freq: Two times a day (BID) | ORAL | Status: DC

## 2012-04-23 MED ORDER — TRAZODONE HCL 50 MG PO TABS: 50.0000 mg | ORAL_TABLET | Freq: Every day | ORAL | Status: DC

## 2012-04-23 MED ORDER — OXYCODONE HCL 20 MG PO TABS: 20.0000 mg | ORAL_TABLET | Freq: Three times a day (TID) | ORAL | Status: DC | PRN

## 2012-04-23 MED ORDER — CLONIDINE HCL 0.1 MG PO TABS: 0.1000 mg | ORAL_TABLET | Freq: Two times a day (BID) | ORAL | Status: DC

## 2012-04-23 NOTE — Progress Notes (Signed)
  Subjective:    Patient ID: Dana Petersen, female    DOB: 11-Mar-1966, 46 y.o.   MRN: [id number]  HPI Here to discuss a number of issues. First she needs more pain meds. Hydrocodone does not help, and hydromorphone is too difficult to get at her pharmacy. She has been in a lot of pain with her back, and has had nothing for it. She is very stressed, since her daughter and grandson have moved back in with her. She has been off prozac for a long time now. She has trouble sleeping. Ambien gave her side effects, but she slept well with some Trazodone she got from her daughter-in-law. Her BP has been up some, despite the fact that she has lost some weight.    Review of Systems  Constitutional: Negative.   Respiratory: Negative.   Cardiovascular: Negative.   Musculoskeletal: Positive for back pain.  Psychiatric/Behavioral: Positive for sleep disturbance and dysphoric mood. The patient is nervous/anxious.        Objective:   Physical Exam  Constitutional: She appears well-developed and well-nourished.  Cardiovascular: Normal rate, regular rhythm, normal heart sounds and intact distal pulses.   Pulmonary/Chest: Effort normal and breath sounds normal.  Psychiatric: She has a normal mood and affect. Her behavior is normal. Thought content normal.          Assessment & Plan:  We will try Oxycodone for the back pain. Try Trazodone for sleep. Increase the Clinidine to bid. Get back back on Prozac.

## 2012-05-12 ENCOUNTER — Telehealth: Payer: Self-pay | Admitting: Family Medicine

## 2012-05-12 NOTE — Telephone Encounter (Addendum)
Pt would like to increase trazodone. Pt is currently on 50mg . [facility] [phone]. Pt has ringworms on her scalp. Pt grandson has ringworms on his side dx by his MD. Pt would like something call into [facility] for ringworms on her scalp maybe ketoconazole. Pt would like  To increase oxycodone from 20mg  to next highest dosage.

## 2012-05-13 ENCOUNTER — Telehealth: Payer: Self-pay | Admitting: Family Medicine

## 2012-05-13 NOTE — Telephone Encounter (Signed)
Patient called back to check the status of her request.

## 2012-05-13 NOTE — Telephone Encounter (Signed)
Patient called back to check status of request.  ° °

## 2012-05-13 NOTE — Telephone Encounter (Signed)
Return pt call no answer

## 2012-05-13 NOTE — Telephone Encounter (Signed)
Pt called again to check the status of refill. Pt states that grandson had a confirmed case of ring worm and feels she has ring worm on her scalp. Pt states she is very uncomfortable and would like to know something asap

## 2012-05-14 MED ORDER — TRAZODONE HCL 100 MG PO TABS: 100.0000 mg | ORAL_TABLET | Freq: Every day | ORAL | Status: AC

## 2012-05-14 MED ORDER — KETOCONAZOLE 2 % EX CREA: TOPICAL_CREAM | Freq: Three times a day (TID) | CUTANEOUS | Status: DC | PRN

## 2012-05-14 NOTE — Telephone Encounter (Signed)
For the Trazodone, increase this to 100 mg qhs. Call in one year supply. For the ringworm, call in Ketoconazole cream 2% to apply tid prn, 60 grams with 5 rf. For the Oxycodone, NO we cannot go any stronger than a 20 mg dose

## 2012-05-14 NOTE — Telephone Encounter (Signed)
See my previous response

## 2012-05-14 NOTE — Telephone Encounter (Signed)
I sent both scripts e-scribe and I spoke with pt.

## 2012-05-18 ENCOUNTER — Other Ambulatory Visit: Payer: Self-pay | Admitting: Family Medicine

## 2012-05-18 MED ORDER — OXYCODONE HCL 20 MG PO TABS: 20.0000 mg | ORAL_TABLET | Freq: Three times a day (TID) | ORAL | Status: DC | PRN

## 2012-05-18 NOTE — Telephone Encounter (Signed)
Pt called to check on status of script for oxycodone. Pt has been notified script is done as noted. Pt says that her husband, Kenidy Crossland, will pick script up for the pt.

## 2012-05-18 NOTE — Telephone Encounter (Signed)
Script is ready for pick up and I left a voice message.  

## 2012-05-18 NOTE — Telephone Encounter (Signed)
done

## 2012-05-18 NOTE — Telephone Encounter (Signed)
Pt is out of oxycodone 20mg .needs new rx

## 2012-06-15 ENCOUNTER — Encounter: Payer: Self-pay | Admitting: Internal Medicine

## 2012-06-21 ENCOUNTER — Telehealth: Payer: Self-pay | Admitting: Family Medicine

## 2012-06-21 ENCOUNTER — Other Ambulatory Visit: Payer: Self-pay | Admitting: Family Medicine

## 2012-06-21 MED ORDER — OXYCODONE HCL 20 MG PO TABS: 20.0000 mg | ORAL_TABLET | Freq: Three times a day (TID) | ORAL | Status: DC | PRN

## 2012-06-21 NOTE — Telephone Encounter (Signed)
Pt needs new rx oxycodone 20mg 

## 2012-06-21 NOTE — Telephone Encounter (Signed)
Script is ready for pick up, tried to reach pt by phone and no answer. 

## 2012-06-21 NOTE — Telephone Encounter (Signed)
done

## 2012-06-21 NOTE — Telephone Encounter (Signed)
Opened in error

## 2012-07-12 ENCOUNTER — Other Ambulatory Visit: Payer: Self-pay | Admitting: Family Medicine

## 2012-07-12 NOTE — Telephone Encounter (Signed)
NO, too soon. Not due until 07-22-12

## 2012-07-12 NOTE — Telephone Encounter (Signed)
Attempt to call- VM - LMTCB if questions - med Rf denied - too soon - not due until 8/22 - if needs before then will have to be seen

## 2012-07-12 NOTE — Telephone Encounter (Signed)
Pt called to check on status of refill Oxycodone 20 mg. Pt req that this be avail today. Pls call.

## 2012-07-12 NOTE — Telephone Encounter (Signed)
Last seen 04/23/12 - back apin Last written 06/21/12 # 90  0RF Please advise

## 2012-07-12 NOTE — Telephone Encounter (Signed)
Pt needs new rx oxycodone 20mg 

## 2012-07-16 ENCOUNTER — Other Ambulatory Visit: Payer: Self-pay | Admitting: Family Medicine

## 2012-07-16 MED ORDER — OXYCODONE HCL 20 MG PO TABS: 20.0000 mg | ORAL_TABLET | Freq: Three times a day (TID) | ORAL | Status: DC | PRN

## 2012-07-16 MED ORDER — ALPRAZOLAM 2 MG PO TABS: 2.0000 mg | ORAL_TABLET | Freq: Four times a day (QID) | ORAL | Status: DC | PRN

## 2012-07-16 NOTE — Telephone Encounter (Signed)
Both printed out 

## 2012-07-16 NOTE — Telephone Encounter (Signed)
Attempt to call - VM - LMTCB if questions - rx's ready for pick up 

## 2012-07-16 NOTE — Telephone Encounter (Signed)
Pt called req renewal Oxycodone HCl 20 MG TABS. Pt said that old script has expired according to pharmacy. Pls call when script is ready for pick up.   Pt also said that [facility] was sending over a refill request for alprazolam Prudy Feeler) 2 MG tablet . Pls call in to [facility].

## 2012-07-16 NOTE — Telephone Encounter (Signed)
Last seen 04/23/12 low back pain Last written 06/21/12 # 90 0Rf Please advise

## 2012-07-20 ENCOUNTER — Telehealth: Payer: Self-pay | Admitting: Family Medicine

## 2012-07-20 NOTE — Telephone Encounter (Signed)
Pt requested the script for Xanax to be called in to [facility], which I did.

## 2012-08-25 ENCOUNTER — Other Ambulatory Visit: Payer: Self-pay | Admitting: Family Medicine

## 2012-08-25 MED ORDER — OXYCODONE HCL 20 MG PO TABS: 20.0000 mg | ORAL_TABLET | Freq: Three times a day (TID) | ORAL | Status: DC | PRN

## 2012-08-25 NOTE — Telephone Encounter (Signed)
Script is ready and I spoke with pt. 

## 2012-08-25 NOTE — Telephone Encounter (Signed)
done

## 2012-08-25 NOTE — Telephone Encounter (Signed)
Pt needs new rx oxycodone. Pt is out

## 2012-09-06 ENCOUNTER — Telehealth: Payer: Self-pay | Admitting: Family Medicine

## 2012-09-06 ENCOUNTER — Other Ambulatory Visit: Payer: Self-pay | Admitting: Family Medicine

## 2012-09-06 NOTE — Telephone Encounter (Signed)
Pt called re: metFORMIN (GLUCOPHAGE) 1000 MG tablet, but she gets this med through pts assistance. Pt says she can get this med free. Call the pharmacist and tell them that pt needs this on the patient assistance. Pls call patient. Pt is out of med. Pls call.

## 2012-09-07 NOTE — Telephone Encounter (Signed)
I spoke with pt and she has already picked up script and there was no charge.

## 2012-09-26 ENCOUNTER — Inpatient Hospital Stay (HOSPITAL_COMMUNITY): Payer: Self-pay

## 2012-09-26 ENCOUNTER — Encounter (HOSPITAL_BASED_OUTPATIENT_CLINIC_OR_DEPARTMENT_OTHER): Payer: Self-pay

## 2012-09-26 ENCOUNTER — Emergency Department (HOSPITAL_BASED_OUTPATIENT_CLINIC_OR_DEPARTMENT_OTHER): Payer: Self-pay

## 2012-09-26 ENCOUNTER — Inpatient Hospital Stay (HOSPITAL_BASED_OUTPATIENT_CLINIC_OR_DEPARTMENT_OTHER)
Admission: EM | Admit: 2012-09-26 | Discharge: 2012-09-28 | DRG: 101 | Disposition: A | Discharge status: Home / Self Care | Payer: BC Managed Care – PPO | Attending: Internal Medicine | Admitting: Internal Medicine

## 2012-09-26 DIAGNOSIS — N390 Urinary tract infection, site not specified: Secondary | ICD-10-CM

## 2012-09-26 DIAGNOSIS — Z720 Tobacco use: Secondary | ICD-10-CM | POA: Diagnosis present

## 2012-09-26 DIAGNOSIS — F1310 Sedative, hypnotic or anxiolytic abuse, uncomplicated: Secondary | ICD-10-CM | POA: Diagnosis present

## 2012-09-26 DIAGNOSIS — F329 Major depressive disorder, single episode, unspecified: Secondary | ICD-10-CM | POA: Diagnosis present

## 2012-09-26 DIAGNOSIS — F1920 Other psychoactive substance dependence, uncomplicated: Secondary | ICD-10-CM | POA: Diagnosis present

## 2012-09-26 DIAGNOSIS — F1910 Other psychoactive substance abuse, uncomplicated: Secondary | ICD-10-CM

## 2012-09-26 DIAGNOSIS — F1120 Opioid dependence, uncomplicated: Secondary | ICD-10-CM

## 2012-09-26 DIAGNOSIS — R569 Unspecified convulsions: Secondary | ICD-10-CM

## 2012-09-26 DIAGNOSIS — K449 Diaphragmatic hernia without obstruction or gangrene: Secondary | ICD-10-CM | POA: Diagnosis present

## 2012-09-26 DIAGNOSIS — Z91199 Patient's noncompliance with other medical treatment and regimen due to unspecified reason: Secondary | ICD-10-CM

## 2012-09-26 DIAGNOSIS — F1994 Other psychoactive substance use, unspecified with psychoactive substance-induced mood disorder: Secondary | ICD-10-CM | POA: Diagnosis present

## 2012-09-26 DIAGNOSIS — I6350 Cerebral infarction due to unspecified occlusion or stenosis of unspecified cerebral artery: Secondary | ICD-10-CM

## 2012-09-26 DIAGNOSIS — I639 Cerebral infarction, unspecified: Secondary | ICD-10-CM

## 2012-09-26 DIAGNOSIS — F331 Major depressive disorder, recurrent, moderate: Secondary | ICD-10-CM

## 2012-09-26 DIAGNOSIS — A498 Other bacterial infections of unspecified site: Secondary | ICD-10-CM | POA: Diagnosis present

## 2012-09-26 DIAGNOSIS — K7460 Unspecified cirrhosis of liver: Secondary | ICD-10-CM

## 2012-09-26 DIAGNOSIS — M545 Low back pain, unspecified: Secondary | ICD-10-CM | POA: Diagnosis present

## 2012-09-26 DIAGNOSIS — G8929 Other chronic pain: Secondary | ICD-10-CM | POA: Diagnosis present

## 2012-09-26 DIAGNOSIS — F19939 Other psychoactive substance use, unspecified with withdrawal, unspecified: Secondary | ICD-10-CM | POA: Diagnosis present

## 2012-09-26 DIAGNOSIS — D696 Thrombocytopenia, unspecified: Secondary | ICD-10-CM | POA: Diagnosis present

## 2012-09-26 DIAGNOSIS — Z9119 Patient's noncompliance with other medical treatment and regimen: Secondary | ICD-10-CM

## 2012-09-26 DIAGNOSIS — E119 Type 2 diabetes mellitus without complications: Secondary | ICD-10-CM

## 2012-09-26 DIAGNOSIS — G40309 Generalized idiopathic epilepsy and epileptic syndromes, not intractable, without status epilepticus: Principal | ICD-10-CM

## 2012-09-26 DIAGNOSIS — I10 Essential (primary) hypertension: Secondary | ICD-10-CM

## 2012-09-26 DIAGNOSIS — K7469 Other cirrhosis of liver: Secondary | ICD-10-CM | POA: Diagnosis present

## 2012-09-26 DIAGNOSIS — F1320 Sedative, hypnotic or anxiolytic dependence, uncomplicated: Secondary | ICD-10-CM

## 2012-09-26 DIAGNOSIS — Z79899 Other long term (current) drug therapy: Secondary | ICD-10-CM

## 2012-09-26 DIAGNOSIS — R930 Abnormal findings on diagnostic imaging of skull and head, not elsewhere classified: Secondary | ICD-10-CM

## 2012-09-26 DIAGNOSIS — F17200 Nicotine dependence, unspecified, uncomplicated: Secondary | ICD-10-CM

## 2012-09-26 DIAGNOSIS — E876 Hypokalemia: Secondary | ICD-10-CM

## 2012-09-26 DIAGNOSIS — K219 Gastro-esophageal reflux disease without esophagitis: Secondary | ICD-10-CM

## 2012-09-26 LAB — RAPID URINE DRUG SCREEN, HOSP PERFORMED
Amphetamines: NOT DETECTED
Benzodiazepines: NOT DETECTED
Cocaine: NOT DETECTED
Opiates: NOT DETECTED
Tetrahydrocannabinol: NOT DETECTED

## 2012-09-26 LAB — URINALYSIS, ROUTINE W REFLEX MICROSCOPIC
Glucose, UA: NEGATIVE mg/dL
Hgb urine dipstick: NEGATIVE
Protein, ur: NEGATIVE mg/dL
Specific Gravity, Urine: 1.013 (ref 1.005–1.030)
Urobilinogen, UA: 4 mg/dL — ABNORMAL HIGH (ref 0.0–1.0)

## 2012-09-26 LAB — CBC WITH DIFFERENTIAL/PLATELET
Basophils Absolute: 0 10*3/uL (ref 0.0–0.1)
HCT: 38 % (ref 36.0–46.0)
Hemoglobin: 13 g/dL (ref 12.0–15.0)
Lymphocytes Relative: 14 % (ref 12–46)
Monocytes Absolute: 0.8 10*3/uL (ref 0.1–1.0)
Monocytes Relative: 6 % (ref 3–12)
Neutro Abs: 10.5 10*3/uL — ABNORMAL HIGH (ref 1.7–7.7)
Neutrophils Relative %: 78 % — ABNORMAL HIGH (ref 43–77)
RDW: 14.5 % (ref 11.5–15.5)
WBC: 13.4 10*3/uL — ABNORMAL HIGH (ref 4.0–10.5)

## 2012-09-26 LAB — COMPREHENSIVE METABOLIC PANEL
ALT: 37 U/L — ABNORMAL HIGH (ref 0–35)
AST: 74 U/L — ABNORMAL HIGH (ref 0–37)
Alkaline Phosphatase: 218 U/L — ABNORMAL HIGH (ref 39–117)
GFR calc Af Amer: >90 mL/min (ref >90)
Glucose, Bld: 154 mg/dL — ABNORMAL HIGH (ref 70–99)
Potassium: 2.8 mEq/L — ABNORMAL LOW (ref 3.5–5.1)
Sodium: 137 mEq/L (ref 135–145)
Total Protein: 7.6 g/dL (ref 6.0–8.3)

## 2012-09-26 LAB — CBC
MCH: 32.7 pg (ref 26.0–34.0)
MCV: 95.3 fL (ref 78.0–100.0)
Platelets: 42 10*3/uL — ABNORMAL LOW (ref 150–400)
RDW: 14.8 % (ref 11.5–15.5)
WBC: 8.6 10*3/uL (ref 4.0–10.5)

## 2012-09-26 LAB — MRSA PCR SCREENING: MRSA by PCR: NEGATIVE

## 2012-09-26 LAB — SALICYLATE LEVEL: Salicylate Lvl: <2 mg/dL — ABNORMAL LOW (ref 2.8–20.0)

## 2012-09-26 LAB — GLUCOSE, CAPILLARY
Glucose-Capillary: 130 mg/dL — ABNORMAL HIGH (ref 70–99)
Glucose-Capillary: 107 mg/dL — ABNORMAL HIGH (ref 70–99)

## 2012-09-26 LAB — URINE MICROSCOPIC-ADD ON

## 2012-09-26 LAB — PREGNANCY, URINE: Preg Test, Ur: NEGATIVE

## 2012-09-26 MED ORDER — POTASSIUM CHLORIDE CRYS ER 20 MEQ PO TBCR
40.0000 meq | EXTENDED_RELEASE_TABLET | Freq: Once | ORAL | Status: AC
  Administered 2012-09-26: 40 meq via ORAL
  Filled 2012-09-26: qty 2

## 2012-09-26 MED ORDER — SODIUM CHLORIDE 0.9 % IV BOLUS (SEPSIS)
500.0000 mL | Freq: Once | INTRAVENOUS | Status: AC
  Administered 2012-09-26: 500 mL via INTRAVENOUS

## 2012-09-26 MED ORDER — INSULIN ASPART 100 UNIT/ML ~~LOC~~ SOLN
0.0000 [IU] | Freq: Three times a day (TID) | SUBCUTANEOUS | Status: DC
  Administered 2012-09-26: 12:00:00 via SUBCUTANEOUS
  Administered 2012-09-27 (×2): 1 [IU] via SUBCUTANEOUS

## 2012-09-26 MED ORDER — SODIUM CHLORIDE 0.9 % IV SOLN
INTRAVENOUS | Status: DC
  Administered 2012-09-26 – 2012-09-27 (×2): via INTRAVENOUS

## 2012-09-26 MED ORDER — LORAZEPAM 2 MG/ML IJ SOLN: 1.0000 mg | Freq: Four times a day (QID) | INTRAMUSCULAR | Status: DC | PRN

## 2012-09-26 MED ORDER — IBUPROFEN 400 MG PO TABS
400.0000 mg | ORAL_TABLET | Freq: Four times a day (QID) | ORAL | Status: DC | PRN
  Administered 2012-09-27: 400 mg via ORAL
  Filled 2012-09-26 (×2): qty 1

## 2012-09-26 MED ORDER — DEXTROSE 5 % IV SOLN
1.0000 g | Freq: Once | INTRAVENOUS | Status: AC
  Administered 2012-09-26: 1 g via INTRAVENOUS
  Filled 2012-09-26: qty 10

## 2012-09-26 MED ORDER — DEXTROSE 5 % IV SOLN
1.0000 g | INTRAVENOUS | Status: DC
  Administered 2012-09-26 – 2012-09-27 (×2): 1 g via INTRAVENOUS
  Filled 2012-09-26 (×2): qty 10

## 2012-09-26 MED ORDER — MAGNESIUM SULFATE 50 % IJ SOLN: 2.0000 g | Freq: Once | INTRAVENOUS | Status: DC

## 2012-09-26 MED ORDER — ENOXAPARIN SODIUM 40 MG/0.4ML ~~LOC~~ SOLN
40.0000 mg | SUBCUTANEOUS | Status: DC
  Administered 2012-09-26 – 2012-09-27 (×2): 40 mg via SUBCUTANEOUS
  Filled 2012-09-26 (×2): qty 0.4

## 2012-09-26 MED ORDER — CLONAZEPAM 1 MG PO TABS
1.0000 mg | ORAL_TABLET | Freq: Two times a day (BID) | ORAL | Status: DC
  Administered 2012-09-26 – 2012-09-28 (×5): 1 mg via ORAL
  Filled 2012-09-26: qty 2
  Filled 2012-09-26 (×4): qty 1
  Filled 2012-09-26: qty 2

## 2012-09-26 MED ORDER — NICOTINE 21 MG/24HR TD PT24
21.0000 mg | MEDICATED_PATCH | Freq: Every day | TRANSDERMAL | Status: DC
  Administered 2012-09-26 – 2012-09-28 (×3): 21 mg via TRANSDERMAL
  Filled 2012-09-26 (×3): qty 1

## 2012-09-26 MED ORDER — ONDANSETRON HCL 4 MG/2ML IJ SOLN
4.0000 mg | Freq: Four times a day (QID) | INTRAMUSCULAR | Status: DC | PRN
  Administered 2012-09-26: 4 mg via INTRAVENOUS
  Filled 2012-09-26: qty 2

## 2012-09-26 MED ORDER — POTASSIUM CHLORIDE 10 MEQ/100ML IV SOLN
10.0000 meq | INTRAVENOUS | Status: AC
  Administered 2012-09-26 (×2): 10 meq via INTRAVENOUS
  Filled 2012-09-26 (×3): qty 100

## 2012-09-26 MED ORDER — POTASSIUM CHLORIDE 10 MEQ/100ML IV SOLN
10.0000 meq | INTRAVENOUS | Status: AC
  Administered 2012-09-26 (×3): 10 meq via INTRAVENOUS
  Filled 2012-09-26 (×2): qty 100

## 2012-09-26 MED ORDER — SODIUM CHLORIDE 0.9 % IV SOLN
1000.0000 mg | Freq: Once | INTRAVENOUS | Status: AC
  Administered 2012-09-26: 1000 mg via INTRAVENOUS
  Filled 2012-09-26: qty 20

## 2012-09-26 MED ORDER — OXYCODONE HCL ER 10 MG PO T12A
20.0000 mg | EXTENDED_RELEASE_TABLET | Freq: Three times a day (TID) | ORAL | Status: DC | PRN
  Administered 2012-09-27 – 2012-09-28 (×2): 20 mg via ORAL
  Filled 2012-09-26: qty 1
  Filled 2012-09-26: qty 2

## 2012-09-26 MED ORDER — ONDANSETRON HCL 4 MG/2ML IJ SOLN: 4.0000 mg | Freq: Four times a day (QID) | INTRAMUSCULAR | Status: DC | PRN

## 2012-09-26 MED ORDER — MAGNESIUM SULFATE 40 MG/ML IJ SOLN
2.0000 g | Freq: Once | INTRAMUSCULAR | Status: AC
  Administered 2012-09-26: 2 g via INTRAVENOUS
  Filled 2012-09-26: qty 50

## 2012-09-26 MED ORDER — HYDROMORPHONE HCL PF 1 MG/ML IJ SOLN
1.0000 mg | INTRAMUSCULAR | Status: DC | PRN
  Administered 2012-09-26 – 2012-09-27 (×3): 1 mg via INTRAVENOUS
  Filled 2012-09-26 (×3): qty 1

## 2012-09-26 MED ORDER — INSULIN ASPART 100 UNIT/ML ~~LOC~~ SOLN: 0.0000 [IU] | Freq: Every day | SUBCUTANEOUS | Status: DC

## 2012-09-26 MED ORDER — PANTOPRAZOLE SODIUM 40 MG PO TBEC
40.0000 mg | DELAYED_RELEASE_TABLET | Freq: Every day | ORAL | Status: DC
  Administered 2012-09-26 – 2012-09-27 (×2): 40 mg via ORAL
  Filled 2012-09-26: qty 1

## 2012-09-26 MED ORDER — CLONIDINE HCL 0.1 MG PO TABS: 0.1000 mg | ORAL_TABLET | Freq: Two times a day (BID) | ORAL | Status: DC

## 2012-09-26 MED ORDER — SODIUM CHLORIDE 0.9 % IV SOLN: 10.0000 mg/kg | Freq: Once | INTRAVENOUS | Status: DC

## 2012-09-26 MED ORDER — LORAZEPAM 2 MG/ML IJ SOLN
INTRAMUSCULAR | Status: AC
  Filled 2012-09-26: qty 1

## 2012-09-26 NOTE — ED Notes (Signed)
Transported to CT 

## 2012-09-26 NOTE — ED Notes (Signed)
Report called to [address], RN

## 2012-09-26 NOTE — ED Notes (Signed)
[facility] here to transport pt to [facility]. Pt. Remains stable. Vital signs stable. No seizure activity noted since arrival to the ED. Family at bedside.

## 2012-09-26 NOTE — ED Notes (Signed)
Pt. Awaiting [facility] to transport pt. To [facility]

## 2012-09-26 NOTE — ED Notes (Addendum)
Assigned bed 2507 @ [facility], RN Notified, [facility] called for transport.

## 2012-09-26 NOTE — Consult Note (Signed)
Reason for Consult:Seizure activity and abnormal CT of the brain Referring Physician: Rai  CC: Seizure  HPI: Dana Petersen is an 46 y.o. female who has multiple medical problems.  Was seen in 2011 for a seizure that was felt to be secondary to hypoglycemia and was not started on anticonvulsants at that time.  Has had no further seizure activity until yesterday about 1330 when was noted by her daughter to have a generalized tonic-clonic seizure.  Patient recovered and they attempted to get her to eat but did not finish, tried to smoke a cigarette and then had another seizure.  Family had the patient brought in at that time.  Patient was loaded with Dilantin and has had no further seizures.   Conversation was had with the patient and family.  It seems someone is with her all day but she controls her own medications.  Patient is prescribed Xanax three times a day.  It seems that she abruptly ran out of her medication somewhere between four and seven days ago.  Prior to that time she was running low and had split her tablets in half.  She and the family report that she only takes 3 tablets a day maximum of the Xanax and that she ran out of it and was not able to afford to get it refilled, but she was prescribed 120 tablets on 9/23.  That bottle is completely empty at this time.  The patient was also on Oxycodone for back pain.  She reports taking no more than three a day and was prescribed 90 on 9/25.  She reports being out of this medication for at least a week.    Past Medical History  Diagnosis Date  . ABNORMAL FINDINGS GI TRACT 09/12/2008  . ABNORMAL TRANSAMINASE-LFT'S 09/12/2008  . Acute bronchitis 07/21/2008  . Cirrhosis of liver without mention of alcohol 08/11/2008    sees Dr. Yancey Flemings  . DEPRESSIVE DISORDER, RCR, MODERATE 07/09/2007  . DIABETES MELLITUS, TYPE II 07/09/2007  . GERD 09/07/2008  . Headache 09/24/2010  . HIATAL HERNIA 09/07/2008  . HYPERTENSION 07/09/2007  . LOW BACK PAIN 07/02/2010    . Overweight 10/01/2007  . 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 Tubal ligation   . Carpal tunnel release   . Lumbar laminectomy     L5-S1  dr. Marianne Sofia    Family History  Problem Relation Age of Onset  . Diabetes Neg Hx     family hx , 1st degree relative  . Hypertension Neg Hx     famliy  . Cancer Neg Hx     family hx - lung , breat, pancreatic ,ovarian    Social History:  reports that she has been smoking Cigarettes.  She has been smoking about 1 pack per day. She has never used smokeless tobacco. She reports that she does not drink alcohol or use illicit drugs.  Allergies  Allergen Reactions  . Aspirin Other (See Comments)    Causes elevated liver enzymes  . Phenergan (Promethazine Hcl) Other (See Comments)    Muscle spasms  . Tylenol (Acetaminophen)     Caused elevated liver enzymes    Medications:  I have reviewed the patient's current medications. Prior to Admission:  Prescriptions prior to admission  Medication Sig Dispense Refill  . alprazolam (XANAX) 2 MG tablet Take 1 tablet (2 mg total) by mouth every 6 (six) hours as needed for sleep or anxiety.  120 tablet  5  . cloNIDine (CATAPRES) 0.1 MG tablet Take 1 tablet (0.1 mg total) by mouth 2 (two) times daily.  60 tablet  11  . furosemide (LASIX) 40 MG tablet Take 1 tablet (40 mg total) by mouth 3 (three) times daily.  90 tablet  11  . ibuprofen (ADVIL,MOTRIN) 200 MG tablet Take 600 mg by mouth every 6 (six) hours as needed. for body aches.      . metFORMIN (GLUCOPHAGE) 1000 MG tablet Take 1,000 mg by mouth 2 (two) times daily with a meal.      . Oxycodone HCl 20 MG TABS Take 20 mg by mouth 3 (three) times daily as needed. For pain       Scheduled:   . cefTRIAXone (ROCEPHIN) IVPB 1 gram/50 mL D5W  1 g Intravenous Once  . cefTRIAXone (ROCEPHIN)  IV  1 g Intravenous Q24H  . clonazePAM  1 mg Oral BID  . enoxaparin  40 mg Subcutaneous Q24H  . insulin aspart  0-5 Units  Subcutaneous QHS  . insulin aspart  0-9 Units Subcutaneous TID WC  . nicotine  21 mg Transdermal Daily  . pantoprazole  40 mg Oral Q0600  . phenytoin (DILANTIN) IV  1,000 mg Intravenous Once  . potassium chloride  10 mEq Intravenous Q1 Hr x 4  . potassium chloride  10 mEq Intravenous Q1 Hr x 3  . potassium chloride  40 mEq Oral Once  . sodium chloride  500 mL Intravenous Once  . DISCONTD: cloNIDine  0.1 mg Oral BID  . DISCONTD: fosPHENYtoin (CEREBYX) IV  10 mg PE/kg Intravenous Once    ROS: History obtained from the patient  General ROS: negative for - chills, fatigue, fever, night sweats, weight gain or weight loss Psychological ROS: anxiety Ophthalmic ROS: negative for - blurry vision, double vision, eye pain or loss of vision ENT ROS: negative for - epistaxis, nasal discharge, oral lesions, sore throat, tinnitus or vertigo Allergy and Immunology ROS: negative for - hives or itchy/watery eyes Hematological and Lymphatic ROS: negative for - bleeding problems, bruising or swollen lymph nodes Endocrine ROS: negative for - galactorrhea, hair pattern changes, polydipsia/polyuria or temperature intolerance Respiratory ROS: negative for - cough, hemoptysis, shortness of breath or wheezing Cardiovascular ROS: negative for - chest pain, dyspnea on exertion, edema or irregular heartbeat Gastrointestinal ROS: negative for - abdominal pain, diarrhea, hematemesis, nausea/vomiting or stool incontinence Genito-Urinary ROS: negative for - dysuria, hematuria, incontinence or urinary frequency/urgency Musculoskeletal ROS: back pain, muscle pain Neurological ROS: as noted in HPI Dermatological ROS: negative for rash and skin lesion changes  Physical Examination: Blood pressure 118/60, pulse 83, temperature 99.1 F (37.3 C), temperature source Oral, resp. rate 11, height 5\' 7"  (1.702 m), weight 88 kg (194 lb 0.1 oz), SpO2 97.00%.  Neurologic Examination Mental Status: Alert, oriented, thought  content appropriate.  Speech fluent without evidence of aphasia.  Can follow simple commands but has difficulty with three step commands. Cranial Nerves: II: Discs flat bilaterally; Visual fields grossly normal, pupils equal, round, reactive to light and accommodation III,IV, VI: ptosis not present, extra-ocular motions intact bilaterally V,VII: smile symmetric, facial light touch sensation normal bilaterally VIII: hearing normal bilaterally IX,X: gag reflex present XI: bilateral shoulder shrug XII: midline tongue extension Motor: Right : Upper extremity   5/5    Left:     Upper extremity   5/5  Lower extremity   5/5     Lower extremity   5/5 Tone and bulk:normal tone throughout; no atrophy  noted Sensory: Pinprick and light touch intact throughout, bilaterally Deep Tendon Reflexes: 2+ and symmetric with absent AJ's bilaterally Plantars: Right: downgoing   Left: downgoing Cerebellar: normal finger-to-nose, normal rapid alternating movements and normal heel-to-shin test Gait: unable to perform secondary to complaints of pain CV: pulses palpable throughout     Laboratory Studies:   Basic Metabolic Panel:  Lab 09/26/12 4782 09/26/12 0409  NA -- 137  K 2.7* 2.8*  CL -- 94*  CO2 -- 20  GLUCOSE -- 154*  BUN -- 13  CREATININE 0.70 0.80  CALCIUM -- 8.6  MG 1.9 --  PHOS -- --    Liver Function Tests:  Lab 09/26/12 0409  AST 74*  ALT 37*  ALKPHOS 218*  BILITOT 3.7*  PROT 7.6  ALBUMIN 3.4*   No results found for this basename: LIPASE:5,AMYLASE:5 in the last 168 hours No results found for this basename: AMMONIA:3 in the last 168 hours  CBC:  Lab 09/26/12 0956 09/26/12 0409  WBC 8.6 13.4*  NEUTROABS -- 10.5*  HGB 11.9* 13.0  HCT 34.7* 38.0  MCV 95.3 94.8  PLT 42* 63*    Cardiac Enzymes: No results found for this basename: CKTOTAL:5,CKMB:5,CKMBINDEX:5,TROPONINI:5 in the last 168 hours  BNP: No components found with this basename: POCBNP:5  CBG:  Lab 09/26/12  0406  GLUCAP 152*    Microbiology: Results for orders placed during the hospital encounter of 09/26/12  MRSA PCR SCREENING     Status: Normal   Collection Time   09/26/12  8:11 AM      Component Value Range Status Comment   MRSA by PCR NEGATIVE  NEGATIVE Final     Coagulation Studies: No results found for this basename: LABPROT:5,INR:5 in the last 72 hours  Urinalysis:  Lab 09/26/12 0511  COLORURINE YELLOW  LABSPEC 1.013  PHURINE 6.0  GLUCOSEU NEGATIVE  HGBUR NEGATIVE  BILIRUBINUR NEGATIVE  KETONESUR NEGATIVE  PROTEINUR NEGATIVE  UROBILINOGEN 4.0*  NITRITE NEGATIVE  LEUKOCYTESUR SMALL*    Lipid Panel:     Component Value Date/Time   CHOL 191 09/24/2011 1236   TRIG 96.0 09/24/2011 1236   HDL 59.20 09/24/2011 1236   CHOLHDL 3 09/24/2011 1236   VLDL 19.2 09/24/2011 1236   LDLCALC 113* 09/24/2011 1236    HgbA1C:  Lab Results  Component Value Date   HGBA1C 4.9 09/24/2011    Urine Drug Screen:     Component Value Date/Time   LABOPIA NONE DETECTED 09/26/2012 0512   COCAINSCRNUR NONE DETECTED 09/26/2012 0512   LABBENZ NONE DETECTED 09/26/2012 0512   AMPHETMU NONE DETECTED 09/26/2012 0512   THCU NONE DETECTED 09/26/2012 0512   LABBARB NONE DETECTED 09/26/2012 0512    Alcohol Level: No results found for this basename: ETH:2 in the last 168 hours  Imaging: Ct Head Wo Contrast  09/26/2012  *RADIOLOGY REPORT*  Clinical Data: Seizures.  Decreased appetite.  CT HEAD WITHOUT CONTRAST  Technique:  Contiguous axial images were obtained from the base of the skull through the vertex without contrast.  Comparison: None.  Findings: There is suggestion of low attenuation changes symmetrically in the left occipital region.  This may be due to partial voluming but early changes of acute infarct could potentially have this appearance.  Ventricles and sulci appear otherwise symmetrical and gray-white matter junctions are otherwise distinct.  Ventricles are not significantly  dilated.  No abnormal extra-axial fluid collections.  Basal cisterns are not effaced.  No depressed skull fractures.  No acute intracranial hemorrhage. Mucosal membrane thickening  and opacification of the right frontal, right maxillary antrum, and bilateral ethmoid sinuses likely to represent inflammatory changes.  Vascular calcifications.  IMPRESSION: Suggestion of possible asymmetric low attenuation change in the left occipital region which could represent early changes of acute infarct.  Inflammatory changes in the paranasal sinuses.  No acute intracranial hemorrhage.   Original Report Authenticated By: Marlon Pel, M.D.    Dg Chest Portable 1 View  09/26/2012  *RADIOLOGY REPORT*  Clinical Data: Seizures and decreased appetite.  PORTABLE CHEST - 1 VIEW  Comparison: 05/01/2010  Findings: Shallow inspiration. The heart size and pulmonary vascularity are normal. The lungs appear clear and expanded without focal air space disease or consolidation. No blunting of the costophrenic angles.  No pneumothorax.  No significant change since previous study, allowing for differences in technique.  IMPRESSION: No evidence of active pulmonary disease.   Original Report Authenticated By: Marlon Pel, M.D.      Assessment/Plan:  Patient Active Hospital Problem List: SEIZURE, GRAND MAL (10/10/2010)   Assessment: Patient with seizure in the past.  Now presenting after having tow seizures at home.  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  Although the patient nor the family admit to her taking more than the prescribed doses, they also do not admit to anyone stealing medication and therefore there is no other way to explain lack of medication.  No one in the room can agree on exactly when she stopped taking these medications completely but an estimation of a week seems most appropriate.  Unclear if it was longer which would suggest even greater abuses.  Urine drug screen was negative.  Patient has  been loaded with Dilantin    Plan:  1.  Patient's seizures most likely related to withdrawal.  Will not start maintenance anticonvulsants at this time.  Will perform the remainder of her work up and only if abnormalities noted during further testing will there be a possibility of initiation.    2.  EEG  Abnormal CT of brain   Assessment:  Unclear etiology of  Finding on head CT.  Patient has no focality on exam.  Further work up indicated prior to making diagnosis of acute infarct.     Plan:  1.  MRI of the brain.  If no acute event noted would not undertake a full stroke work up.    2.  Based on results of MRI would determine patient's need for antiplatelet therapy.  If acute infarct noted would start patient on Plavix 75mg  daily since patient is allergic to ASA.    Thana Farr, MD [facility] [phone] 09/26/2012, 11:26 AM
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
## 2012-09-26 NOTE — Plan of Care (Signed)
Problem: Phase I Progression Outcomes Goal: Hemodynamically stable No seizure like activity noted. Pt reports improvement in pain: resting, eating. Family at bedside, all questions answered.

## 2012-09-26 NOTE — Progress Notes (Signed)
CRITICAL VALUE ALERT  Critical value received:  11:00  Date of notification:  10/27  Time of notification:  11"45  Critical value read back:yes  Nurse who received alert:  Nigel Sloop  MD notified (1st page): Dr. Isidoro Donning  Time of first page:  11:45  MD notified (2nd page):n/a  Time of second page:n/a Responding MD:  n/a  Time MD responded:  11:45

## 2012-09-26 NOTE — ED Notes (Signed)
Patient here with family reporting that patient has had seizure x 2 today. On arrival patient alert and no seizure activity noted. Family reports that patient has been out of xanax and oxycodone x 4-5 days. Also reports decreased appetite for the past 3 days.

## 2012-09-26 NOTE — ED Provider Notes (Signed)
History     CSN: [id number]  Arrival date & time 09/26/12  0355   First MD Initiated Contact with Patient 09/26/12 0400      Chief Complaint  Patient presents with  . Seizures    (Consider location/radiation/quality/duration/timing/severity/associated sxs/prior treatment) Patient is a 46 y.o. female presenting with seizures. The history is provided by the spouse and a relative. The history is limited by the condition of the patient. No language interpreter was used.  Seizures  This is a recurrent problem. The current episode started yesterday. The problem has not changed since onset.There were 2 to 3 seizures. Associated symptoms include sleepiness and patient experiences confusion. Pertinent negatives include no speech difficulty, no neck stiffness, no cough and no vomiting. Characteristics include rhythmic jerking. Characteristics do not include eye blinking or eye deviation. The episode was witnessed. There was no sensation of an aura present. The seizures did not continue in the ED. The seizure(s) had no focality. Possible causes do not include med or dosage change or change in alcohol use. There has been no fever. There were no medications administered prior to arrival.    Past Medical History  Diagnosis Date  . ABNORMAL FINDINGS GI TRACT 09/12/2008  . ABNORMAL TRANSAMINASE-LFT'S 09/12/2008  . Acute bronchitis 07/21/2008  . Cirrhosis of liver without mention of alcohol 08/11/2008    sees Dr. Yancey Flemings  . DEPRESSIVE DISORDER, RCR, MODERATE 07/09/2007  . DIABETES MELLITUS, TYPE II 07/09/2007  . GERD 09/07/2008  . Headache 09/24/2010  . HIATAL HERNIA 09/07/2008  . HYPERTENSION 07/09/2007  . LOW BACK PAIN 07/02/2010  . Overweight 10/01/2007  . 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 Tubal ligation   . Carpal tunnel release   . Lumbar laminectomy     L5-S1  dr. Marianne Sofia    Family History  Problem Relation Age of Onset  . Diabetes Neg Hx    family hx , 1st degree relative  . Hypertension Neg Hx     famliy  . Cancer Neg Hx     family hx - lung , breat, pancreatic ,ovarian    History  Substance Use Topics  . Smoking status: Current Every Day Smoker -- 1.0 packs/day    Types: Cigarettes  . Smokeless tobacco: Never Used  . Alcohol Use: No    OB History    Grav Para Term Preterm Abortions TAB SAB Ect Mult Living                  Review of Systems  Unable to perform ROS Respiratory: Negative for cough.   Gastrointestinal: Negative for vomiting.  Neurological: Positive for seizures. Negative for speech difficulty.  Psychiatric/Behavioral: Positive for confusion.    Allergies  Tylenol  Home Medications   Current Outpatient Rx  Name Route Sig Dispense Refill  . ALPRAZOLAM 2 MG PO TABS Oral Take 1 tablet (2 mg total) by mouth every 6 (six) hours as needed for sleep or anxiety. 120 tablet 5  . CLONIDINE HCL 0.1 MG PO TABS Oral Take 1 tablet (0.1 mg total) by mouth 2 (two) times daily. 60 tablet 11  . FUROSEMIDE 40 MG PO TABS Oral Take 1 tablet (40 mg total) by mouth 3 (three) times daily. 90 tablet 11  . METFORMIN HCL 1000 MG PO TABS  TAKE 1 TABLET TWICE DAILY 60 tablet 8  . OXYCODONE HCL 20 MG PO TABS Oral Take 1 tablet (20 mg total) by mouth 3 (three)  times daily as needed (pain). 90 tablet 0  . FLUOXETINE HCL 40 MG PO CAPS Oral Take 1 capsule (40 mg total) by mouth 2 (two) times daily. 60 capsule 11  . GLUCOSE BLOOD VI STRP Other 1 each by Other route daily. Use as instructed     . IBUPROFEN 200 MG PO TABS Oral Take 400 mg by mouth every 6 (six) hours as needed. Patient used this medication for body aches.    Marland Kitchen KETOCONAZOLE 2 % EX CREA Topical Apply topically 3 (three) times daily as needed. 60 g 5  . PROMETHAZINE HCL 25 MG PO TABS Oral Take 1 tablet (25 mg total) by mouth every 4 (four) hours as needed. 60 tablet 2    There were no vitals taken for this visit.  Physical Exam  Constitutional: She appears  well-developed and well-nourished. No distress.  HENT:  Head: Normocephalic and atraumatic.  Right Ear: Tympanic membrane is not injected.  Left Ear: Tympanic membrane is not injected.  Mouth/Throat: Oropharynx is clear and moist. No oropharyngeal exudate.  Eyes: Conjunctivae normal and EOM are normal. Pupils are equal, round, and reactive to light.  Neck: Normal range of motion. Neck supple. No JVD present.  Cardiovascular: Normal rate, regular rhythm and intact distal pulses.   Pulmonary/Chest: Effort normal and breath sounds normal. She has no wheezes. She has no rales.  Abdominal: Soft. Bowel sounds are normal. There is no tenderness. There is no rebound and no guarding.  Musculoskeletal: Normal range of motion.  Neurological: She is alert. She has normal reflexes. She displays normal reflexes. No cranial nerve deficit. She exhibits normal muscle tone.  Skin: Skin is warm and dry. She is not diaphoretic.  Psychiatric: Her affect is blunt.    ED Course  Procedures (including critical care time)   Labs Reviewed  CBC WITH DIFFERENTIAL  COMPREHENSIVE METABOLIC PANEL  LACTIC ACID, PLASMA  ACETAMINOPHEN LEVEL  SALICYLATE LEVEL  URINE RAPID DRUG SCREEN (HOSP PERFORMED)  GLUCOSE, CAPILLARY   No results found.   No diagnosis found.    MDM  DDx:  1, epilepsy 2. Xanax and oxycodone withdrawal seizures 3. Hypoglycemia (patient ate burrito prior to coming to [facility]) but had not been eating x several days   Patient denying CP, SOB, urinary sx, weakness nor numbness no changes in vision or speech prior to transfer.      Date: 09/26/2012  Rate: 85  Rhythm: normal sinus rhythm  QRS Axis: normal  Intervals: normal  ST/T Wave abnormalities: nonspecific ST/T changes  Conduction Disutrbances:none  Narrative Interpretation:   Old EKG Reviewed: none available   MDM Reviewed: nursing note and vitals (previous primary care MD notes) Reviewed previous: labs and MRI Interpretation:  ECG, x-ray, CT scan and labs Total time providing critical care: 75-105 minutes. This excludes time spent performing separately reportable procedures and services. Consults: neurology and admitting MD    Case d/w Dr. Olin Pia who will see the patient in consult, please admit to medicine.  IV load with 1000 mg of keppra on arrival at [facility] reconnected Dr. Foy Guadalajara regarding CT results, Patient will need MRI of the brain  CRITICAL CARE Performed by: Jasmine Awe   Total critical care time: 90 minutes  Critical care time was exclusive of separately billable procedures and treating other patients.  Critical care was necessary to treat or prevent imminent or life-threatening deterioration.  Critical care was time spent personally by me on the following activities: development of treatment plan with patient and/or  surrogate as well as nursing, discussions with consultants, evaluation of patient's response to treatment, examination of patient, obtaining history from patient or surrogate, ordering and performing treatments and interventions, ordering and review of laboratory studies, ordering and review of radiographic studies, pulse oximetry and re-evaluation of patient's condition.     Jasmine Awe, MD 09/26/12 [id number]

## 2012-09-26 NOTE — Progress Notes (Signed)
*  PRELIMINARY RESULTS* Vascular Ultrasound Carotid Duplex (Doppler) has been completed. Bilateral internal carotid arteries demonstrate elevated velocities in the 40-59% range, however there is no evidence of significant plaque morphology to support stenosis. Vertebral arteries are patent with antegrade flow.  09/26/2012 2:58 PM Gertie Fey, RDMS, RDCS

## 2012-09-26 NOTE — ED Notes (Signed)
Returned from CT.

## 2012-09-26 NOTE — ED Notes (Signed)
In and out cath using sterile tech. Clear yellow urine noted. Tolerated well.

## 2012-09-26 NOTE — H&P (Signed)
History and Physical       Hospital Admission Note Date: 09/26/2012  Patient name: Dana Petersen Medical record number: [id number] Date of birth: March 23, 1966 Age: 46 y.o. Gender: female PCP: [name], MD    Chief Complaint:  2 seizures within last 24 hours  HPI: Patient is a 46 year old female with history of cirrhosis, depression, diabetes, GERD, chronic low back pain was sent from [facility] ED for seizures and questionable CVA. History was obtained from the patient and her husband. Patient is somewhat postictal still and was not able to provide a clear history. Per patient's husband who was at home and witnessed her for seizure which was around 1:15 PM yesterday, grand mal type with, whole body jerking, urinary incontinence and tongue biting and lasted about 3-5 minutes. She was somewhat postictal after the seizure. She had another seizure at 3:30 AM this morning at home after which she presented to [facility] ED. Per patient's husband she had 1 seizure in 2011 which was thought to be from hypoglycemia and she was not placed on any antiseizure medications by her PCP. The patient husband reported that she did not have any fevers, chills or any neck stiffness prior to for seizure yesterday. He had not noticed any slurring of speech or focal weakness yesterday. Patient was given Dilantin loading dose in [facility] ED. Of note on medication review, it is listed as Xanax 2 mg q6hours PRN for anxiety/sleep however patient admitted to taking it scheduled 2mg  TID (verified by her husband) and she ran out of Xanax a week ago and oxycodone a month ago. Patient husband reported that they could not fill the prescription.   Review of Systems:  Constitutional: Denies fever, chills, diaphoresis, appetite change and fatigue.  HEENT: Denies photophobia, eye pain, redness, hearing loss, ear pain, congestion, sore throat, rhinorrhea, sneezing, mouth sores,  trouble swallowing, neck pain, neck stiffness and tinnitus.   Respiratory: Denies SOB, DOE, cough, chest tightness,  and wheezing.   Cardiovascular: Denies chest pain, palpitations and leg swelling.  Gastrointestinal: Denies nausea, vomiting, abdominal pain, diarrhea, constipation, blood in stool and abdominal distention.  Genitourinary: Denies dysuria, urgency, frequency, hematuria, flank pain and difficulty urinating.  Musculoskeletal: The patient endorses chronic back pain and had a back surgery and takes oxycodone 3 times a day. Skin: Denies pallor, rash and wound.  Neurological: Please see history of present illness Hematological: Denies adenopathy. Easy bruising, personal or family bleeding history  Psychiatric/Behavioral: Patient has history of depression however she is not taking her medications as prescribed.. As above she was taking Xanax scheduled 2mg TID even though it was prescribed PRN, she has ran out of fluoxetine a month ago    Past Medical History: Past Medical History  Diagnosis Date  . ABNORMAL FINDINGS GI TRACT 09/12/2008  . ABNORMAL TRANSAMINASE-LFT'S 09/12/2008  . Acute bronchitis 07/21/2008  . Cirrhosis of liver without mention of alcohol 08/11/2008    sees Dr. Yancey Flemings  . DEPRESSIVE DISORDER, RCR, MODERATE 07/09/2007  . DIABETES MELLITUS, TYPE II 07/09/2007  . GERD 09/07/2008  . Headache 09/24/2010  . HIATAL HERNIA 09/07/2008  . HYPERTENSION 07/09/2007  . LOW BACK PAIN 07/02/2010  . Overweight 10/01/2007  . SEIZURE, GRAND MAL 10/10/2010   Past Surgical History  Procedure Date  . Tubal ligation   . Carpal tunnel release   . Lumbar laminectomy     L5-S1  dr. Marianne Sofia    Medications: Prior to Admission medications   Medication Sig Start Date End Date Taking?  Authorizing Provider  alprazolam Prudy Feeler) 2 MG tablet Take 1 tablet (2 mg total) by mouth every 6 (six) hours as needed for sleep or anxiety. 07/16/12  Yes [name], MD  cloNIDine (CATAPRES) 0.1 MG tablet  Take 1 tablet (0.1 mg total) by mouth 2 (two) times daily. 04/23/12  Yes [name], MD  furosemide (LASIX) 40 MG tablet Take 1 tablet (40 mg total) by mouth 3 (three) times daily. 01/20/12  Yes [name], MD  ibuprofen (ADVIL,MOTRIN) 200 MG tablet Take 600 mg by mouth every 6 (six) hours as needed. for body aches.   Yes Historical Provider, MD  metFORMIN (GLUCOPHAGE) 1000 MG tablet Take 1,000 mg by mouth 2 (two) times daily with a meal.   Yes Historical Provider, MD  Oxycodone HCl 20 MG TABS Take 20 mg by mouth 3 (three) times daily as needed. For pain   Yes Historical Provider, MD    Allergies:   Allergies  Allergen Reactions  . Aspirin Other (See Comments)    Causes elevated liver enzymes  . Phenergan (Promethazine Hcl) Other (See Comments)    Muscle spasms  . Tylenol (Acetaminophen)     Caused elevated liver enzymes    Social History:  reports that she has been smoking Cigarettes.  She has been smoking about 1 pack per day. She has never used smokeless tobacco. She reports that she does not drink alcohol or use illicit drugs.  Family History: Family History  Problem Relation Age of Onset  . Diabetes Neg Hx     family hx , 1st degree relative  . Hypertension Neg Hx     famliy  . Cancer Neg Hx     family hx - lung , breat, pancreatic ,ovarian    Physical Exam: Blood pressure 118/60, pulse 83, temperature 99.1 F (37.3 C), temperature source Oral, resp. rate 11, height 5\' 7"  (1.702 m), weight 88 kg (194 lb 0.1 oz), SpO2 97.00%. General: Alert, awake, but somewhat still postictal, in no acute distress. HEENT: normocephalic, atraumatic, anicteric sclera, pink conjunctiva, pupils dilated, equal and reactive to light and accomodation, oropharynx clear Neck: supple, no masses or lymphadenopathy, no goiter, no bruits  Heart: Regular rate and rhythm, without murmurs, rubs or gallops. Lungs: Clear to auscultation bilaterally, no wheezing, rales or rhonchi. Abdomen: Soft,  nontender, nondistended, positive bowel sounds, no masses. Extremities: No clubbing, cyanosis or edema with positive pedal pulses. Neuro: Grossly intact, no focal neurological deficits, strength 5/5 upper and lower extremities bilaterally Psych: alert and somewhat still postictal, normal mood and affect Skin: no rashes or lesions, warm and dry   LABS on Admission:  Basic Metabolic Panel:  Lab 09/26/12 1914  NA 137  K 2.8*  CL 94*  CO2 20  GLUCOSE 154*  BUN 13  CREATININE 0.80  CALCIUM 8.6  MG --  PHOS --   Liver Function Tests:  Lab 09/26/12 0409  AST 74*  ALT 37*  ALKPHOS 218*  BILITOT 3.7*  PROT 7.6  ALBUMIN 3.4*   CBC:  Lab 09/26/12 0409  WBC 13.4*  NEUTROABS 10.5*  HGB 13.0  HCT 38.0  MCV 94.8  PLT 63*     Lab 09/26/12 0406  GLUCAP 152*     Radiological Exams on Admission: Ct Head Wo Contrast  09/26/2012  *RADIOLOGY REPORT*  Clinical Data: Seizures.  Decreased appetite.  CT HEAD WITHOUT CONTRAST  Technique:  Contiguous axial images were obtained from the base of the skull through the vertex without  contrast.  Comparison: None.  Findings: There is suggestion of low attenuation changes symmetrically in the left occipital region.  This may be due to partial voluming but early changes of acute infarct could potentially have this appearance.  Ventricles and sulci appear otherwise symmetrical and gray-white matter junctions are otherwise distinct.  Ventricles are not significantly dilated.  No abnormal extra-axial fluid collections.  Basal cisterns are not effaced.  No depressed skull fractures.  No acute intracranial hemorrhage. Mucosal membrane thickening and opacification of the right frontal, right maxillary antrum, and bilateral ethmoid sinuses likely to represent inflammatory changes.  Vascular calcifications.  IMPRESSION: Suggestion of possible asymmetric low attenuation change in the left occipital region which could represent early changes of acute infarct.   Inflammatory changes in the paranasal sinuses.  No acute intracranial hemorrhage.   Original Report Authenticated By: Marlon Pel, M.D.    Dg Chest Portable 1 View  09/26/2012  *RADIOLOGY REPORT*  Clinical Data: Seizures and decreased appetite.  PORTABLE CHEST - 1 VIEW  Comparison: 05/01/2010  Findings: Shallow inspiration. The heart size and pulmonary vascularity are normal. The lungs appear clear and expanded without focal air space disease or consolidation. No blunting of the costophrenic angles.  No pneumothorax.  No significant change since previous study, allowing for differences in technique.  IMPRESSION: No evidence of active pulmonary disease.   Original Report Authenticated By: Marlon Pel, M.D.     Assessment/Plan Principal Problem:  *SEIZURE, GRAND MAL: Potentially precipitated due to benzodiazepine withdrawal, however patient does have ? CVA on the CT scan which needs further workup vs primary seizure disorder vs autoimmune process. There does not appear to be any malignancy or brain tumor on the CT scan. - Admit to step down, patient has received Dilantin loading dose, seizure precautions, EEG, CVA workup, neurology consult, discussed with Dr. Thad Ranger - I have placed her on scheduled Klonopin, patient needs to be tapered/weaned off Xanax   CVA (cerebral infarction): ? Acute - Obtain MRI/MRA for confirmation, 2-D echocardiogram, carotid Dopplers, lipid panel, HbA1c - If patient does have an acute stroke on MRI she may need a hypercoagulable/vasculitis workup as well (age 50, no family history of stroke) - She is allergic to aspirin, I will defer to neurology and await confirmation on MRI if the patient should be placed on Plavix   Active Problems:  DIABETES MELLITUS, TYPE II: - Place on sliding scale insulin, obtain hemoglobin A1c, hold metformin (patient has lactic acidosis)   DEPRESSIVE DISORDER, MODERATE with substance abuse: - I have called psychiatry  consultation especially with benzodiazepine abuse and potentially having seizure from withdrawals. - For now, I have placed her on Klonopin 1mg  BID and ativan PRN   HYPERTENSION: Her BP is currently borderline and low 100s - I will hold off on clonidine and Lasix for now, gentle hydration.   GERD: Continue PPI   Liver cirrhosis: Likely the cause of transaminitis, currently close to her baseline   Chronic LOW BACK PAIN - Continue oxycodone and PRN dilaudid   UTI (urinary tract infection): - obtain urine culture, place on Rocephin IV   Nicotine abuse: Placed on nicotine patch  Hypokalemia: Likely secondary to diuretics - Obtain stat K and Mg level, replace potassium  DVT prophylaxis: Lovenox  CODE STATUS: Presumed Full code  Further plan will depend as patient's clinical course evolves and further radiologic and laboratory data become available.   Time Spent on Admission: 1 hour  RAI,RIPUDEEP M.D. [facility] 09/26/2012, 9:56 AM  Pager: [phone]  If 7PM-7AM, please contact night-coverage [email or web address] Password [email or web address]

## 2012-09-27 ENCOUNTER — Inpatient Hospital Stay (HOSPITAL_COMMUNITY): Payer: Self-pay

## 2012-09-27 DIAGNOSIS — G40309 Generalized idiopathic epilepsy and epileptic syndromes, not intractable, without status epilepticus: Principal | ICD-10-CM

## 2012-09-27 LAB — GLUCOSE, CAPILLARY: Glucose-Capillary: 110 mg/dL — ABNORMAL HIGH (ref 70–99)

## 2012-09-27 LAB — BASIC METABOLIC PANEL
CO2: 26 mEq/L (ref 19–32)
Calcium: 8 mg/dL — ABNORMAL LOW (ref 8.4–10.5)
GFR calc Af Amer: >90 mL/min (ref >90)
GFR calc non Af Amer: >90 mL/min (ref >90)
Sodium: 139 mEq/L (ref 135–145)

## 2012-09-27 LAB — LIPID PANEL
Cholesterol: 164 mg/dL (ref 0–200)
Triglycerides: 108 mg/dL (ref <150)

## 2012-09-27 LAB — CBC
MCH: 33.8 pg (ref 26.0–34.0)
Platelets: 32 10*3/uL — ABNORMAL LOW (ref 150–400)
RBC: 3.52 MIL/uL — ABNORMAL LOW (ref 3.87–5.11)
RDW: 15.3 % (ref 11.5–15.5)

## 2012-09-27 LAB — HEMOGLOBIN A1C: Hgb A1c MFr Bld: 5.3 % (ref <5.7)

## 2012-09-27 MED ORDER — CEFUROXIME AXETIL 500 MG PO TABS
500.0000 mg | ORAL_TABLET | Freq: Two times a day (BID) | ORAL | Status: DC
  Administered 2012-09-27 – 2012-09-28 (×2): 500 mg via ORAL
  Filled 2012-09-27 (×4): qty 1

## 2012-09-27 MED ORDER — LORAZEPAM 2 MG/ML IJ SOLN: 1.0000 mg | Freq: Four times a day (QID) | INTRAMUSCULAR | Status: DC | PRN

## 2012-09-27 NOTE — Progress Notes (Addendum)
[facility] Progress Note [place] TEAM 1 - Stepdown/ICU TEAM   Dana Petersen ZOX:[id number] DOB: [birth year] DOA: 09/26/2012 PCP: [name], MD  Brief narrative: 46 year old female with history of cirrhosis, depression, diabetes, GERD, chronic low back pain was sent from [facility] ED for seizures and questionable CVA. History was obtained from the husband. Patient is somewhat postictal and was not able to provide a clear history. Per patient's husband who was at home and witnessed her for seizure which was around 1:15 PM yesterday, grand mal type with, whole body jerking, urinary incontinence and tongue biting and lasted about 3-5 minutes. She was somewhat postictal after the seizure. She had another seizure at 3:30 AM this morning at home after which she presented to [facility] ED. Per patient's husband she had 1 seizure in 2011 which was thought to be from hypoglycemia and she was not placed on any antiseizure medications by her PCP. The patient husband reported that she did not have any fevers, chills or any neck stiffness prior to for seizure yesterday. He had not noticed any slurring of speech or focal weakness yesterday. Patient was given Dilantin loading dose in [facility] ED.  Of note on medication review is listed as Xanax 2 mg q6hours PRN for anxiety/sleep however patient admitted to taking it scheduled 2mg  TID (verified by her husband) and she ran out of Xanax a week ago and oxycodone a month ago. Patient husband reported that they could not fill the prescription.   Assessment/Plan:  SEIZURE, GRAND MAL:  Potentially precipitated by benzodiazepine withdrawal - see recs as per Neuro - awaiting EEG findings   ?CVA on CT scan - MRI is without acute finding  MRI confirms no acute CVA - no further w/u needed  DIABETES MELLITUS, TYPE II:  CBGs well controlled at this time - A1c 5.3  DEPRESSIVE DISORDER, MODERATE with substance abuse:  See recs per Psychiatry  HYPERTENSION:  BP  is currently well controlled  Chronic LOW BACK PAIN  Continue prn pain meds   E coli UTI (urinary tract infection):  Cont abx tx - transition to oral meds - f/u sensitivities on culture    Nicotine abuse: on nicotine patch   Hypokalemia: secondary to home diuretics - improved w/ replacement - recheck in AM  Cirrhosis of liver without mention of alcohol  Recheck LFTs in AM - followed by GI in outpt setting  Thrombocytopenia Likely due to liver disease - d/c lovenox - f/u in AM  Code Status: Full Disposition Plan: transfer to Neuro medical bed  Consultants: Neurology  Antibiotics: Rocephin 09/26/2012>>09/27/2012 Ceftin 09/27/2012>>  DVT prophylaxis: Lovenox>>SCDs  HPI/Subjective: Patient is resting comfortably today.  There's been no seizure activity.  She denies fevers chills nausea vomiting shortness of breath.   Objective: Blood pressure 132/77, pulse 79, temperature 98.8 F (37.1 C), temperature source Oral, resp. rate 16, height 5\' 7"  (1.702 m), weight 88 kg (194 lb 0.1 oz), SpO2 99.00%.  Intake/Output Summary (Last 24 hours) at 09/27/12 1601 Last data filed at 09/27/12 1500  Gross per 24 hour  Intake   1730 ml  Output    850 ml  Net    880 ml     Exam: General: No acute respiratory distress Lungs: Clear to auscultation bilaterally without wheezes or crackles Cardiovascular: Regular rate and rhythm without murmur gallop or rub normal S1 and S2 Abdomen: Nontender, nondistended, soft, bowel sounds positive, no rebound, no ascites, no appreciable mass Extremities: No significant cyanosis, clubbing, or edema  bilateral lower extremities  Data Reviewed: Basic Metabolic Panel:  Lab 09/27/12 1610 09/26/12 1338 09/26/12 0956 09/26/12 0409  NA 139 -- -- 137  K 3.5 4.0 2.7* 2.8*  CL 105 -- -- 94*  CO2 26 -- -- 20  GLUCOSE 103* -- -- 154*  BUN 9 -- -- 13  CREATININE 0.71 -- 0.70 0.80  CALCIUM 8.0* -- -- 8.6  MG -- -- 1.9 --  PHOS -- -- -- --   Liver  Function Tests:  Lab 09/26/12 0409  AST 74*  ALT 37*  ALKPHOS 218*  BILITOT 3.7*  PROT 7.6  ALBUMIN 3.4*   CBC:  Lab 09/27/12 0455 09/26/12 0956 09/26/12 0409  WBC 4.9 8.6 13.4*  NEUTROABS -- -- 10.5*  HGB 11.9* 11.9* 13.0  HCT 34.8* 34.7* 38.0  MCV 98.9 95.3 94.8  PLT 32* 42* 63*   CBG:  Lab 09/27/12 1204 09/27/12 0815 09/26/12 1647 09/26/12 1200 09/26/12 0406  GLUCAP 122* 110* 107* 130* 152*    Recent Results (from the past 240 hour(s))  URINE CULTURE     Status: Normal (Preliminary result)   Collection Time   09/26/12  5:11 AM      Component Value Range Status Comment   Specimen Description URINE, CATHETERIZED   Final    Special Requests NONE   Final    Culture  Setup Time 09/26/2012 18:50   Final    Colony Count >=100,000 COLONIES/ML   Final    Culture ESCHERICHIA COLI   Final    Report Status PENDING   Incomplete   MRSA PCR SCREENING     Status: Normal   Collection Time   09/26/12  8:11 AM      Component Value Range Status Comment   MRSA by PCR NEGATIVE  NEGATIVE Final      Studies:  Recent x-ray studies have been reviewed in detail by the Attending Physician  Scheduled Meds:  Reviewed in detail by the Attending Physician   Lonia Blood, MD [facility] Office  [phone] Pager [phone]  On-Call/Text Page:      [email or web address]      password [email or web address]  If 7PM-7AM, please contact night-coverage [email or web address] Password [email or web address] 09/27/2012, 4:01 PM   LOS: 1 day

## 2012-09-27 NOTE — Evaluation (Signed)
Physical Therapy Evaluation Patient Details Name: Dana Petersen MRN: [id number] DOB: Mar 01, 1966 Today's Date: 09/27/2012 Time: 1191-4782 PT Time Calculation (min): 34 min  PT Assessment / Plan / Recommendation Clinical Impression  Patient is a 46 yo female admitted with seizures.  Patient now with general weakness, decreased balance, and decreased cognition, all impacting functional mobility.  Patient will benefit from acute PT to maximize independence prior to discharge home with family.  Recommend HHPT.  May require AD - TBD.    PT Assessment  Patient needs continued PT services    Follow Up Recommendations  Home health PT;Supervision/Assistance - 24 hour    Does the patient have the potential to tolerate intense rehabilitation      Barriers to Discharge None      Equipment Recommendations   (Possibly cane - TBD)    Recommendations for Other Services     Frequency Min 3X/week    Precautions / Restrictions Precautions Precautions: Fall (Seizure precautions) Restrictions Weight Bearing Restrictions: No   Pertinent Vitals/Pain       Mobility  Bed Mobility Bed Mobility: Not assessed Transfers Transfers: Sit to Stand;Stand to Sit Sit to Stand: 4: Min guard;With upper extremity assist;With armrests;From chair/3-in-1 Stand to Sit: 4: Min guard;With upper extremity assist;With armrests;To chair/3-in-1 Details for Transfer Assistance: Verbal cues for hand placement and to scoot to edge of chair before standing.   Assist for balance/safety. Ambulation/Gait Ambulation/Gait Assistance: 4: Min assist Ambulation Distance (Feet): 86 Feet Assistive device: None Ambulation/Gait Assistance Details: Patient with slow, guarded gait.  Staggering gait with loss of balance x 3 during gait, requiring assist to maintain balance. Gait Pattern: Step-through pattern;Decreased stride length;Trunk flexed Gait velocity: Slow gait speed           PT Diagnosis: Difficulty  walking;Abnormality of gait;Generalized weakness;Altered mental status  PT Problem List: Decreased strength;Decreased activity tolerance;Decreased balance;Decreased mobility;Decreased coordination;Decreased cognition;Decreased knowledge of use of DME;Decreased safety awareness;Pain PT Treatment Interventions: DME instruction;Gait training;Stair training;Functional mobility training;Balance training;Neuromuscular re-education;Cognitive remediation;Patient/family education   PT Goals Acute Rehab PT Goals PT Goal Formulation: With patient/family Time For Goal Achievement: 10/04/12 Potential to Achieve Goals: Good Pt will go Supine/Side to Sit: Independently;with HOB 0 degrees PT Goal: Supine/Side to Sit - Progress: Goal set today Pt will go Sit to Supine/Side: Independently;with HOB 0 degrees PT Goal: Sit to Supine/Side - Progress: Goal set today Pt will go Sit to Stand: with supervision;with upper extremity assist PT Goal: Sit to Stand - Progress: Goal set today Pt will Ambulate: >150 feet;with supervision;with least restrictive assistive device PT Goal: Ambulate - Progress: Goal set today Pt will Go Up / Down Stairs: 1-2 stairs;with supervision;with least restrictive assistive device PT Goal: Up/Down Stairs - Progress: Goal set today Additional Goals Additional Goal #1: Patient will score > 20 on DGI balance assessment to indicate low fall risk. PT Goal: Additional Goal #1 - Progress: Goal set today  Visit Information  Last PT Received On: 09/27/12 Assistance Needed: +1    Subjective Data  Subjective: "I don't remember what happened when I had the seizures" Patient Stated Goal: To return home   Prior Functioning  Home Living Lives With: Spouse;Daughter Lucila [place]) Available Help at Discharge: Family;Available 24 hours/day Type of Home: House Home Access: Stairs to enter [employer] of Steps: 2 Entrance Stairs-Rails: None Home Layout: One level Bathroom Shower/Tub:  Forensic scientist: Standard Home Adaptive Equipment: None Prior Function Level of Independence: Independent Able to Take Stairs?: Yes Driving: No (due to seizures)  Vocation: Unemployed Communication Communication: No difficulties Dominant Hand: Right    Cognition  Overall Cognitive Status: Impaired Area of Impairment: Attention;Memory;Safety/judgement;Problem solving;Awareness of deficits Arousal/Alertness: Awake/alert Orientation Level: Disoriented to;Time Behavior During Session: [facility] for tasks performed Current Attention Level: Selective Memory Deficits: Patient unable to recall information provided at beginning of session. Safety/Judgement: Decreased awareness of need for assistance Safety/Judgement - Other Comments: Patient began standing before PT was near chair. Awareness of Deficits: Patient unaware of balance issues initially.    Extremity/Trunk Assessment Right Upper Extremity Assessment RUE ROM/Strength/Tone: Within functional levels RUE Sensation: WFL - Light Touch Left Upper Extremity Assessment LUE ROM/Strength/Tone: Within functional levels LUE Sensation: WFL - Light Touch Right Lower Extremity Assessment RLE ROM/Strength/Tone: Deficits RLE ROM/Strength/Tone Deficits: Strength grossly 4/5 RLE Sensation: WFL - Light Touch RLE Coordination: Deficits RLE Coordination Deficits: Decreased coordination with heel-to-shin Left Lower Extremity Assessment LLE ROM/Strength/Tone: Deficits LLE ROM/Strength/Tone Deficits: Strength grossly 4/5 LLE Sensation: WFL - Light Touch LLE Coordination: Deficits LLE Coordination Deficits: Decreased coordination with heel-to-shin   Balance Balance Balance Assessed: Yes High Level Balance High Level Balance Activites: Turns;Sudden stops;Head turns High Level Balance Comments: Patient with loss of balance with all high level balance activities, requiring assist to regain balance.  End of Session PT - End of  Session Equipment Utilized During Treatment: Gait belt Activity Tolerance: Patient limited by fatigue Patient left: in chair;with call bell/phone within reach;with family/visitor present Nurse Communication: Mobility status  GP Functional Assessment Tool Used: Marland Kitchen   Vena Austria 09/27/2012, 6:06 PM Durenda Hurt. Renaldo Fiddler, [facility] Acute Rehab Services Pager [phone]

## 2012-09-27 NOTE — Progress Notes (Signed)
Patient Identification:  Dana Petersen Date of Evaluation:  09/27/2012 Reason for Consult:  Pt with chronic major depression, stopped Xanax and Prozac abruptly and had a seizure Referring Provider:   Dr. Isidoro Donning  History of Present Illness: The patient states she has had chronic depression for many years. 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 Unfortunately her husband did work but the co-pay for her wife health insurance was so great they could not afford it. She used her last Xanax and Prozac some weeks ago. As a result she had a seizure. She's had 2 or 3 said she stopped the Xanax.   Past Psychiatric History: She has suffered from depression and anxiety since she was a teenager. She dropped out at 11th grade and did not finish high school.  She started to go to [employer] to get her diploma this was interrupted. 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 They lost everything in her first homestead and had to move to read still ingrained and in [place] she has tried Effexor and Zoloft and no antidepressant has made any difference. She denies suicidal ideation now and she did not have any flulike symptoms when she stopped the Prozac She has never had seizures until she stopped the Xanax abruptly.  She also has a sleep disorder. She says she's never slept more than a few R. his either day or night. She has not been able to resume work and has applied for SSD. It was not awarded and now she is seeking an attorney to advocate for her SSD.  Past Medical History:     Past Medical History  Diagnosis Date  . ABNORMAL FINDINGS GI TRACT 09/12/2008  . ABNORMAL TRANSAMINASE-LFT'S 09/12/2008  . Acute bronchitis 07/21/2008  . Cirrhosis of liver without mention of alcohol 08/11/2008    sees Dr. Yancey Flemings  . DEPRESSIVE DISORDER, RCR, MODERATE 07/09/2007  . DIABETES MELLITUS, TYPE II 07/09/2007    . GERD 09/07/2008  . Headache 09/24/2010  . HIATAL HERNIA 09/07/2008  . HYPERTENSION 07/09/2007  . LOW BACK PAIN 07/02/2010  . Overweight 10/01/2007  . SEIZURE, GRAND MAL 10/10/2010       Past Surgical History  Procedure Date  . Tubal ligation   . Carpal tunnel release   . Lumbar laminectomy     L5-S1  dr. Marianne Sofia    Allergies:  Allergies  Allergen Reactions  . Aspirin Other (See Comments)    Causes elevated liver enzymes  . Phenergan (Promethazine Hcl) Other (See Comments)    Muscle spasms  . Tylenol (Acetaminophen)     Caused elevated liver enzymes    Current Medications:  Prior to Admission medications   Medication Sig Start Date End Date Taking? Authorizing Provider  alprazolam Prudy Feeler) 2 MG tablet Take 1 tablet (2 mg total) by mouth every 6 (six) hours as needed for sleep or anxiety. 07/16/12  Yes [name], MD  cloNIDine (CATAPRES) 0.1 MG tablet Take 1 tablet (0.1 mg total) by mouth 2 (two) times daily. 04/23/12  Yes [name], MD  furosemide (LASIX) 40 MG tablet Take 1 tablet (40 mg total) by mouth 3 (three) times daily. 01/20/12  Yes [name], MD  ibuprofen (ADVIL,MOTRIN) 200 MG tablet Take 600 mg by mouth every 6 (six) hours as needed. for body aches.   Yes Historical Provider, MD  metFORMIN (GLUCOPHAGE) 1000 MG tablet Take 1,000 mg by mouth 2 (two) times daily with a meal.   Yes Historical Provider, MD  Oxycodone HCl 20 MG TABS Take 20 mg by mouth 3 (three) times daily as needed. For pain   Yes Historical Provider, MD    Social History:    reports that she has been smoking Cigarettes.  She has been smoking about 1 pack per day. She has never used smokeless tobacco. She reports that she does not drink alcohol or use illicit drugs.   Family History:    Family History  Problem Relation Age of Onset  . Diabetes Neg Hx     family hx , 1st degree relative  . Hypertension Neg Hx     famliy  . Cancer Neg Hx     family hx - lung , breat, pancreatic ,ovarian     Mental Status Examination/Evaluation: Objective:  Appearance: Casual, Fairly Groomed and Freshly bathed,  shampooed and hair combed. She wears glasses   Psychomotor Activity:  Normal  Eye Contact::  Good  Speech:  Clear and Coherent, Normal Rate and Drone like  Volume:  Normal  Mood:  Depressed and Dysphoric  Affect:  Congruent and Depressed  Thought Process:  Coherent, Relevant and Hopeless  Orientation:  Other:  Oriented to person place situation and daily tasks at home  Thought Content:  Financial concerns   Suicidal Thoughts:  No  Homicidal Thoughts:  No  Judgement:  Fair  Insight:  Fair    DIAGNOSIS:   AXIS I   Maj. depressive disorder, treatment resistant, benzodiazepine-dependent, tobacco dependence   AXIS II  Deffered  AXIS III See medical notes.  AXIS IV economic problems, educational problems, occupational problems, other psychosocial or environmental problems, problems related to social environment, problems with primary support group and Problems with family cooperation  AXIS V 51-60 moderate symptoms   Assessment/Plan:  Patient is present with her daughter and Psych CSW is present Patient is sitting in chair she looks alert and is able to describe her situation which is financial more than anything. Her personal health insurance ran out and she was no longer able to get her prescriptions or see her doctors. Her husband's insurance was so prohibitive but they could not carry her insurance on his account. Therefore, she went weeks without her Prozac and her benzodiazepine. Prozac has a long half-life and therefore would not cause the egregious flulike symptoms but other SSRI medications because if they are abruptly discontinued. However, Xanax, if abruptly stopped, may cause seizures and she reports she had at least 2 of them. They are described as tonic-clonic with tongue biting but no urinary incontinence. She said that there financial hardships have caused him to move  twice relocate and reestablish subsistence. Her husband works and she does the house work. Until recently they had both herself, husband, son (who just moved out), her daughter and grandchild living in the same house. She admits she has OCD tendencies when it comes to housekeeping she notices things that are displaced and rather than requests assistance she tries to correct and arrange things as she prefers. She admits there is a lot a companion flying about and that adds to the stress and to her depression.    Her husband has been able to work for the [employer]. He works every week and there funds are limited. She admits to smoking a pack per day. She is encouraged in light of their financial stresses  to consider both the advantages to her health, to her grandchild's health (avoiding secondary cigarette smoke), and the financial advantage if she were to stop smoking. This is shared with her daughter who is also smoking. RECOMMENDATION:  1. This patient is cognitively intact. However, she has presented with a treatment resistant depression which has  greateest support and efficacy for ECT treatment. However it is clear that financial limitations and lack of health insurance has prohibited the greatest option of treatment. 2. Suggest referral to [facility] which will allow her to be seen by a psychiatrist and a therapist with minimal cost and possibly samples of medication. 3. If this patient can get the medication, a suggest Celebrex for his depression and antianxiety efficacy. 4. Patient apparently has a sleep disorder. The hypnotics Ambien, Lunesta, Sonata, ramelteon may be of help. If she cannot obtain anyone of these, consider trazodone 50-100 mg at bedtime.   5. Patient and daughter are also encouraged to consider smoking cessation. CSW provides phone number for free samples. 6. No further needs identified. M.D. Psychiatrist signs off Dana Assefa J. Ferol Luz, MD Psychiatrist   09/27/2012.    10:30 AM

## 2012-09-27 NOTE — Progress Notes (Signed)
Pt to travel to 4N room 29 via wheelchair, no IV, no O2. Family and belongings at bedside. Meds in chart. VS stable at transfer. No current questions or concerns.  Dana Petersen

## 2012-09-27 NOTE — Progress Notes (Signed)
Report called to Chase Picket RN. Pt eating dinner. Will transfer when dinner complete. Anneka Studer L

## 2012-09-27 NOTE — Progress Notes (Signed)
Subjective: Patient reports that she is feeling much better today.  Has not had any further seizures.  OOB and in the chair that is bedside.  Family present.    Objective: Current vital signs: BP 128/58  Pulse 82  Temp 98.4 F (36.9 C) (Oral)  Resp 19  Ht 5\' 7"  (1.702 m)  Wt 88 kg (194 lb 0.1 oz)  BMI 30.39 kg/m2  SpO2 95% Vital signs in last 24 hours: Temp:  [98.4 F (36.9 C)-99.2 F (37.3 C)] 98.4 F (36.9 C) (10/28 0800) Pulse Rate:  [68-92] 82  (10/28 0956) Resp:  [10-19] 19  (10/28 0956) BP: (104-143)/(46-101) 128/58 mmHg (10/28 0956) SpO2:  [91 %-99 %] 95 % (10/28 0956) Weight:  [88 kg (194 lb 0.1 oz)] 88 kg (194 lb 0.1 oz) (10/28 0300)  Intake/Output from previous day: 10/27 0701 - 10/28 0700 In: 1715 [P.O.:240; I.V.:975; IV Piggyback:500] Out: 750 [Urine:750] Intake/Output this shift: Total I/O In: 225 [I.V.:225] Out: -  Nutritional status: Carb Control  Neurologic Exam: Mental Status:  Alert, oriented, thought content appropriate. Speech fluent without evidence of aphasia. Can follow simple commands but has difficulty with three step commands.  Cranial Nerves:  II: Discs flat bilaterally; Visual fields grossly normal, pupils equal, round, reactive to light and accommodation  III,IV, VI: ptosis not present, extra-ocular motions intact bilaterally  V,VII: smile symmetric, facial light touch sensation normal bilaterally  VIII: hearing normal bilaterally  IX,X: gag reflex present  XI: bilateral shoulder shrug  XII: midline tongue extension  Motor:  Right : Upper extremity 5/5         Left: Upper extremity 5/5   Lower extremity 5/5      Lower extremity 5/5  Tone and bulk:normal tone throughout; no atrophy noted  Sensory: Pinprick and light touch intact throughout, bilaterally  Deep Tendon Reflexes: 2+ and symmetric with absent AJ's bilaterally  Plantars:  Right: downgoing      Left: downgoing  Cerebellar:  normal finger-to-nose, normal rapid alternating  movements and normal heel-to-shin test  CV: pulses palpable throughout    Lab Results: Basic Metabolic Panel:  Lab 09/27/12 4782 09/26/12 1338 09/26/12 0956 09/26/12 0409  NA 139 -- -- 137  K 3.5 4.0 2.7* 2.8*  CL 105 -- -- 94*  CO2 26 -- -- 20  GLUCOSE 103* -- -- 154*  BUN 9 -- -- 13  CREATININE 0.71 -- 0.70 0.80  CALCIUM 8.0* -- -- 8.6  MG -- -- 1.9 --  PHOS -- -- -- --    Liver Function Tests:  Lab 09/26/12 0409  AST 74*  ALT 37*  ALKPHOS 218*  BILITOT 3.7*  PROT 7.6  ALBUMIN 3.4*   No results found for this basename: LIPASE:5,AMYLASE:5 in the last 168 hours No results found for this basename: AMMONIA:3 in the last 168 hours  CBC:  Lab 09/27/12 0455 09/26/12 0956 09/26/12 0409  WBC 4.9 8.6 13.4*  NEUTROABS -- -- 10.5*  HGB 11.9* 11.9* 13.0  HCT 34.8* 34.7* 38.0  MCV 98.9 95.3 94.8  PLT 32* 42* 63*    Cardiac Enzymes: No results found for this basename: CKTOTAL:5,CKMB:5,CKMBINDEX:5,TROPONINI:5 in the last 168 hours  Lipid Panel:  Lab 09/27/12 0455  CHOL 164  TRIG 108  HDL 44  CHOLHDL 3.7  VLDL 22  LDLCALC 98    CBG:  Lab 09/26/12 1647 09/26/12 1200 09/26/12 0406  GLUCAP 107* 130* 152*    Microbiology: Results for orders placed during the hospital encounter of 09/26/12  MRSA PCR SCREENING     Status: Normal   Collection Time   09/26/12  8:11 AM      Component Value Range Status Comment   MRSA by PCR NEGATIVE  NEGATIVE Final     Coagulation Studies: No results found for this basename: LABPROT:5,INR:5 in the last 72 hours  Imaging: Ct Head Wo Contrast  09/26/2012  *RADIOLOGY REPORT*  Clinical Data: Seizures.  Decreased appetite.  CT HEAD WITHOUT CONTRAST  Technique:  Contiguous axial images were obtained from the base of the skull through the vertex without contrast.  Comparison: None.  Findings: There is suggestion of low attenuation changes symmetrically in the left occipital region.  This may be due to partial voluming but early changes  of acute infarct could potentially have this appearance.  Ventricles and sulci appear otherwise symmetrical and gray-white matter junctions are otherwise distinct.  Ventricles are not significantly dilated.  No abnormal extra-axial fluid collections.  Basal cisterns are not effaced.  No depressed skull fractures.  No acute intracranial hemorrhage. Mucosal membrane thickening and opacification of the right frontal, right maxillary antrum, and bilateral ethmoid sinuses likely to represent inflammatory changes.  Vascular calcifications.  IMPRESSION: Suggestion of possible asymmetric low attenuation change in the left occipital region which could represent early changes of acute infarct.  Inflammatory changes in the paranasal sinuses.  No acute intracranial hemorrhage.   Original Report Authenticated By: Marlon Pel, M.D.    Mr Brain Wo Contrast  09/26/2012  *RADIOLOGY REPORT*  Clinical Data:  Two seizures in the past 24 hours.  History of diabetes and cirrhosis.  MRI BRAIN WITHOUT CONTRAST MRA HEAD WITHOUT CONTRAST  Technique: Multiplanar, multiecho pulse sequences of the brain and surrounding structures were obtained according to standard protocol without intravenous contrast.  Angiographic images of the head were obtained using MRA technique without contrast.  Comparison: 09/26/2012 head CT.  No comparison brain MR.  MRI HEAD  Findings:  No acute infarct.  Minimal nonspecific white matter type changes may be related to result of small vessel disease in this diabetic patient.  No evidence of mesial temporal sclerosis.  Small right-sided choroidal fissure cyst.   No intracranial mass lesion otherwise noted on this unenhanced exam.  No intracranial hemorrhage.  Major intracranial vascular structures are patent.  Cervical medullary junction, pituitary region, pineal region and orbital structures unremarkable.  Slight decreased signal intensity of bone marrow may be related to the patient's habitus.  Result of  anemia not entirely excluded.  Moderate mucosal thickening right frontal sinus, right maxillary sinus and mid ethmoid sinus air cells bilaterally.  IMPRESSION: No acute infarct.  Minimal nonspecific white matter type changes may be related to result of small vessel disease in this diabetic patient.  No evidence of mesial temporal sclerosis.  Small right-sided choroidal fissure cyst.  Slight decreased signal intensity of bone marrow may be related to the patient's habitus.  Result of anemia not entirely excluded.  Moderate mucosal thickening right frontal sinus, right maxillary sinus and mid ethmoid sinus air cells bilaterally.  MRA HEAD  Findings: Motion degraded exam. This limits evaluation for grading stenosis accurately and detecting small aneurysm appear  Anterior circulation without medium or large size vessel significant stenosis or occlusion.  Right vertebral artery is slightly dominant in size.  No high-grade stenosis of either vertebral artery or basilar artery.  Poor evaluation of the PICAs secondary to the motion degradation.  Nonvisualization left AICA.  Superior cerebellar artery and posterior cerebral artery mild branch  vessel irregularity.  No obvious aneurysm or vascular malformation.  IMPRESSION: Motion degraded examination without large size vessel significant stenosis or occlusion.   Original Report Authenticated By: Fuller Canada, M.D.    Dg Chest Portable 1 View  09/26/2012  *RADIOLOGY REPORT*  Clinical Data: Seizures and decreased appetite.  PORTABLE CHEST - 1 VIEW  Comparison: 05/01/2010  Findings: Shallow inspiration. The heart size and pulmonary vascularity are normal. The lungs appear clear and expanded without focal air space disease or consolidation. No blunting of the costophrenic angles.  No pneumothorax.  No significant change since previous study, allowing for differences in technique.  IMPRESSION: No evidence of active pulmonary disease.   Original Report Authenticated By:  Marlon Pel, M.D.    Mr Mra Head/brain Wo Cm  09/26/2012  *RADIOLOGY REPORT*  Clinical Data:  Two seizures in the past 24 hours.  History of diabetes and cirrhosis.  MRI BRAIN WITHOUT CONTRAST MRA HEAD WITHOUT CONTRAST  Technique: Multiplanar, multiecho pulse sequences of the brain and surrounding structures were obtained according to standard protocol without intravenous contrast.  Angiographic images of the head were obtained using MRA technique without contrast.  Comparison: 09/26/2012 head CT.  No comparison brain MR.  MRI HEAD  Findings:  No acute infarct.  Minimal nonspecific white matter type changes may be related to result of small vessel disease in this diabetic patient.  No evidence of mesial temporal sclerosis.  Small right-sided choroidal fissure cyst.   No intracranial mass lesion otherwise noted on this unenhanced exam.  No intracranial hemorrhage.  Major intracranial vascular structures are patent.  Cervical medullary junction, pituitary region, pineal region and orbital structures unremarkable.  Slight decreased signal intensity of bone marrow may be related to the patient's habitus.  Result of anemia not entirely excluded.  Moderate mucosal thickening right frontal sinus, right maxillary sinus and mid ethmoid sinus air cells bilaterally.  IMPRESSION: No acute infarct.  Minimal nonspecific white matter type changes may be related to result of small vessel disease in this diabetic patient.  No evidence of mesial temporal sclerosis.  Small right-sided choroidal fissure cyst.  Slight decreased signal intensity of bone marrow may be related to the patient's habitus.  Result of anemia not entirely excluded.  Moderate mucosal thickening right frontal sinus, right maxillary sinus and mid ethmoid sinus air cells bilaterally.  MRA HEAD  Findings: Motion degraded exam. This limits evaluation for grading stenosis accurately and detecting small aneurysm appear  Anterior circulation without medium or  large size vessel significant stenosis or occlusion.  Right vertebral artery is slightly dominant in size.  No high-grade stenosis of either vertebral artery or basilar artery.  Poor evaluation of the PICAs secondary to the motion degradation.  Nonvisualization left AICA.  Superior cerebellar artery and posterior cerebral artery mild branch vessel irregularity.  No obvious aneurysm or vascular malformation.  IMPRESSION: Motion degraded examination without large size vessel significant stenosis or occlusion.   Original Report Authenticated By: Fuller Canada, M.D.     Medications:  I have reviewed the patient's current medications. Scheduled:   . cefTRIAXone (ROCEPHIN)  IV  1 g Intravenous Q24H  . clonazePAM  1 mg Oral BID  . enoxaparin  40 mg Subcutaneous Q24H  . insulin aspart  0-5 Units Subcutaneous QHS  . insulin aspart  0-9 Units Subcutaneous TID WC  . magnesium sulfate 1 - 4 g bolus IVPB  2 g Intravenous Once  . nicotine  21 mg Transdermal Daily  . pantoprazole  40 mg Oral Q0600  . potassium chloride  10 mEq Intravenous Q1 Hr x 3  . potassium chloride  40 mEq Oral Once  . 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  On  No prophylaxis.  Seizures likely related to medication withdrawal.     Plan:  1.  EEG pending.  If no abnormalities noted would not initiate AED therapy Abnormal CT scan of head (09/26/2012)   Assessment: MRI of the brain performed and review shows no evidence of an acute mass lesion or infarct.  MRA shows no evidence of significant large vessel stenosis.   Plan: No further work up recommended.       LOS: 1 day   Thana Farr, MD [facility] [phone] 09/27/2012  10:45 AM

## 2012-09-27 NOTE — Progress Notes (Signed)
Clinical Social Work Department CLINICAL SOCIAL WORK PSYCHIATRY SERVICE LINE ASSESSMENT 09/27/2012  Patient:  Dana Petersen  Account:  [id number]  Admit Date:  09/26/2012  Clinical Social Worker:  Ronda Fairly, CLINICAL SOCIAL WORKER  Date/Time:  09/27/2012 03:30 PM Referred by:  Physician  Date referred:   Reason for Referral  Other - See comment   Presenting Symptoms/Problems (In the person's/family's own words):   Patient's family brought her to ED following two seizures at home.  Patient and family reports that patient ran out of medication a week prior to seizure thus medications were stopped abruptly as patient could not refill due to cost.   Abuse/Neglect/Trauma History (check all that apply)  Denies history   Abuse/Neglect/Trauma Comments:   Psychiatric History (check all that apply)  Outpatient treatment   Psychiatric medications:  Antidepressants (several different trials over the years of Prozac, Paxil, etc), Xanax has been prescribed for over 10 years 3 times daily and OxyContin for pain 3 times daily   Current Mental Health Hospitalizations/Previous Mental Health History:   None reported.   Current provider:   Patient's PCP, Liana Gerold at [facility] and Date:   Last seen August 2013   Current Medications:   Current facility-administered medications:0.9 %  sodium chloride infusion, , Intravenous, Continuous, Ripudeep K Rai, MD, Last Rate: 75 mL/hr at 09/27/12 0500;  cefTRIAXone (ROCEPHIN) 1 g in dextrose 5 % 50 mL IVPB, 1 g, Intravenous, Q24H, Ripudeep K Rai, MD, 1 g at 09/27/12 1127;  clonazePAM (KLONOPIN) tablet 1 mg, 1 mg, Oral, BID, Ripudeep K Rai, MD, 1 mg at 09/27/12 0949  enoxaparin (LOVENOX) injection 40 mg, 40 mg, Subcutaneous, Q24H, Ripudeep K Rai, MD, 40 mg at 09/27/12 1125; HYDROmorphone (DILAUDID) injection 1 mg, 1 mg, Intravenous, Q4H PRN, Ripudeep K Rai, MD, 1 mg at 09/26/12 1537; ibuprofen (ADVIL,MOTRIN) tablet 400 mg, 400 mg, Oral, Q6H  PRN, Ripudeep K Rai, MD, 400 mg at 09/27/12 0948;  insulin aspart (novoLOG) injection 0-5 Units, 0-5 Units, Subcutaneous, QHS, Ripudeep K Rai, MD  insulin aspart (novoLOG) injection 0-9 Units, 0-9 Units, Subcutaneous, TID WC, Ripudeep K Rai, MD, 1 Units at 09/27/12 1219;  LORazepam (ATIVAN) injection 1 mg, 1 mg, Intravenous, Q6H PRN, Ripudeep K Rai, MD;  nicotine (NICODERM CQ - dosed in mg/24 hours) patch 21 mg, 21 mg, Transdermal, Daily, Ripudeep K Rai, MD, 21 mg at 09/27/12 0949;  ondansetron (ZOFRAN) injection 4 mg, 4 mg, Intravenous, Q6H PRN, Ripudeep K Rai, MD, 4 mg at 09/26/12 1830  OxyCODONE (OXYCONTIN) 12 hr tablet 20 mg, 20 mg, Oral, TID PRN, Ripudeep K Rai, MD, 20 mg at 09/27/12 1248; pantoprazole (PROTONIX) EC tablet 40 mg, 40 mg, Oral, Q0600, Ripudeep K Rai, MD, 40 mg at 09/27/12 1610   Previous Impatient Admission/Date/Reason:   Emotional Health / Current Symptoms    Suicide/Self Harm  None reported   Suicide attempt in the past:   Denies   Other harmful behavior:   Denies   Psychotic/Dissociative Symptoms  None reported   Other Psychotic/Dissociative Symptoms:   Patient reports minimal sleep    Attention/Behavioral Symptoms  Within Normal Limits   Other Attention / Behavioral Symptoms:    Cognitive Impairment  Within Normal Limits   Other Cognitive Impairment:    Mood and Adjustment  DEPRESSION  Flat    Stress, Anxiety, Trauma, Any Recent Loss/Stressor  Relationship   Anxiety (frequency):   Phobia (specify):   Compulsive behavior (specify):   Obsessive behavior (specify):  Other:   Patient and daughter both report additional stress since patient's son moved out of the family home which has decreased both financial and emotional support.   Substance Abuse/Use  Current substance use   SBIRT completed (please refer for detailed history):  Y  Self-reported substance use:   Patient reports that she is currently prescribed both Xanax (for over 10 years)  and OxyContin.  Seizures occurred within one week  after patient reports she abruptly stopped pain med's due to inability to pay for refills.   Urinary Drug Screen Completed:   Alcohol level:    Environmental/Housing/Living Arrangement  With Family Member   Who is in the home:   Patient's husband of 28 years, daughter Irving Burton and one year old grandson   Emergency contact:  Husband Brett Canales [phone]    Patient's Strengths and Goals (patient's own words):   Patient has supportive family environment consisting of husband, daughter and grandson.  Son is also supportive. Patient realizes depression is playing a role in her loss of interest in activities. Patient is open to discussing issues and would likely benefit from therapeutic relationship in form of outpatient therapy.   Clinical Social Worker's Interpretive Summary:   Patient presents with flat aspect for the most part but did exhibit enthusiasm when husband came to visit toward end of assessment. Patient was open about her history with antidepressants and reportedly has dealt with depression for over 20 years.  Patient was somewhat resistant to concept that stopping opiate and benzo use abruptly might relate to seizures. Patient reports need to continue current prescribed medication in order to deal with her pain and anxiety.    Disposition:  Outpatient referral made/needed for [facility] in [facility].     Carney Bern, LCSWA

## 2012-09-27 NOTE — Progress Notes (Signed)
Portable EEG completed

## 2012-09-27 NOTE — Evaluation (Signed)
Occupational Therapy Evaluation Patient Details Name: Dana Petersen MRN: [id number] DOB: 1966-11-22 Today's Date: 09/27/2012 Time: 4098-1191 OT Time Calculation (min): 30 min  OT Assessment / Plan / Recommendation Clinical Impression  Pt admitted for 2 szs and being worked up for CVA with deficits listed below.  Pt would benefit from cont OT to achieve mod I level of adl functioning and to improve cognitive IADLs.     OT Assessment  Patient needs continued OT Services    Follow Up Recommendations  Outpatient OT    Barriers to Discharge None    Equipment Recommendations  3 in 1 bedside comode    Recommendations for Other Services Speech consult  Frequency  Min 2X/week    Precautions / Restrictions Precautions Precautions: Fall;Other (comment) Precaution Comments: seizure precautions. Restrictions Weight Bearing Restrictions: No   Pertinent Vitals/Pain Pt with 6/10 chronic back pain.  Pt with BP 128/58.    ADL  Eating/Feeding: Performed;Independent Where Assessed - Eating/Feeding: Chair Grooming: Performed;Brushing hair;Wash/dry hands Where Assessed - Grooming: Unsupported sitting Upper Body Bathing: Simulated;Set up Where Assessed - Upper Body Bathing: Unsupported sitting Lower Body Bathing: Minimal assistance;Simulated Where Assessed - Lower Body Bathing: Supported sit to stand Upper Body Dressing: Simulated;Set up Where Assessed - Upper Body Dressing: Unsupported sitting Lower Body Dressing: Performed;Minimal assistance Where Assessed - Lower Body Dressing: Supported sit to stand Toilet Transfer: Performed;Minimal assistance Toilet Transfer Method: Sit to Barista: Materials engineer and Hygiene: Performed;Min guard Where Assessed - Engineer, mining and Hygiene: Sit to stand from 3-in-1 or toilet Transfers/Ambulation Related to ADLs: Pt overall min guard to occasional min assist for balance  during adls. ADL Comments: Pt only requires min assist when standing and not holding onto something.    OT Diagnosis: Generalized weakness;Cognitive deficits  OT Problem List: Impaired balance (sitting and/or standing);Decreased cognition;Decreased knowledge of precautions;Pain OT Treatment Interventions: Self-care/ADL training;Cognitive remediation/compensation;Balance training   OT Goals Acute Rehab OT Goals OT Goal Formulation: With patient/family Time For Goal Achievement: 10/11/12 Potential to Achieve Goals: Good ADL Goals Pt Will Perform Grooming: Independently;Standing at sink ADL Goal: Grooming - Progress: Goal set today Pt Will Perform Lower Body Bathing: with supervision;Sitting in shower ADL Goal: Lower Body Bathing - Progress: Goal set today Pt Will Perform Lower Body Dressing: Independently;Sit to stand from chair (gathering own clothes) ADL Goal: Lower Body Dressing - Progress: Goal set today Pt Will Perform Tub/Shower Transfer: Tub transfer;with supervision ADL Goal: Tub/Shower Transfer - Progress: Goal set today Additional ADL Goal #1: Pt will complete all aspects of toileting with mod I with 3:1 over commode. ADL Goal: Additional Goal #1 - Progress: Goal set today Miscellaneous OT Goals Miscellaneous OT Goal #1: Pt will pick items off floor with S. OT Goal: Miscellaneous Goal #1 - Progress: Goal set today Miscellaneous OT Goal #2: Pt will answer basic money management questions with 100% accuracy. OT Goal: Miscellaneous Goal #2 - Progress: Goal set today  Visit Information  Last OT Received On: 09/27/12 Assistance Needed: +1    Subjective Data  Subjective: "I am feeling better." Patient Stated Goal: to go home   Prior Functioning     Home Living Lives With: Spouse Available Help at Discharge: Family;Available 24 hours/day Type of Home: House Home Access: Stairs to enter [employer] of Steps: 1 Entrance Stairs-Rails: None Home Layout: One  level Bathroom Shower/Tub: Forensic scientist: Standard Bathroom Accessibility: No Home Adaptive Equipment: None Prior Function Level of Independence: Independent  Able to Take Stairs?: Yes Driving: Yes Vocation: Unemployed Comments: working on getting disability. Communication Communication: No difficulties Dominant Hand: Right         Vision/Perception Vision - Assessment Vision Assessment: Vision tested Ocular Range of Motion: Within Functional Limits Tracking/Visual Pursuits: Able to track stimulus in all quads without difficulty Perception Perception: Not tested Praxis Praxis: Intact   Cognition  Overall Cognitive Status: Impaired Area of Impairment: Attention;Memory;Problem solving;Executive functioning Arousal/Alertness: Awake/alert Orientation Level: Disoriented to;Time Behavior During Session: [facility] for tasks performed Current Attention Level: Selective Attention - Other Comments: Pt at times would need questions repeated.  Pt with good questions but had difficulty going back and forth between topics. Memory: Decreased recall of precautions Memory Deficits: Pt unable to recall phone number, address of her home or month/date/year.  Pt did recall "911" as emergency number. Problem Solving: High level problem solving difficult when counting money. Executive Functioning: Pt normally takes care of paying bills at home. Spoke to husband about taking this over for awhile but continuing to include her in this task. Cognition - Other Comments: Pt with very slow processing with most questions.    Extremity/Trunk Assessment Right Upper Extremity Assessment RUE ROM/Strength/Tone: Within functional levels RUE Sensation: WFL - Light Touch RUE Coordination: WFL - gross/fine motor Left Upper Extremity Assessment LUE ROM/Strength/Tone: Within functional levels LUE Sensation: WFL - Light Touch LUE Coordination: WFL - gross/fine motor Right Lower Extremity  Assessment RLE ROM/Strength/Tone: Within functional levels RLE Sensation: WFL - Light Touch RLE Coordination: WFL - gross/fine motor Left Lower Extremity Assessment LLE ROM/Strength/Tone: Within functional levels LLE Sensation: WFL - Light Touch LLE Coordination: WFL - gross/fine motor Trunk Assessment Trunk Assessment: Normal     Mobility Bed Mobility Bed Mobility: Supine to Sit Supine to Sit: 5: Supervision Details for Bed Mobility Assistance: no physical assistn needed. Transfers Transfers: Sit to Stand;Stand to Sit Sit to Stand: 4: Min guard;From bed Stand to Sit: 4: Min guard;With armrests;To chair/3-in-1 Details for Transfer Assistance: min guard for safety and lines.     Shoulder Instructions     Exercise     Balance Balance Balance Assessed: No   End of Session OT - End of Session Activity Tolerance: Patient tolerated treatment well Patient left: in chair;with call bell/phone within reach;with family/visitor present Nurse Communication: Mobility status  GO     Hope Budds 09/27/2012, 10:20 AM [phone]

## 2012-09-27 NOTE — Progress Notes (Signed)
Received patient from 2500 via chair, admitted to 4N29, oriented to room, use of call light, and safety precautions.  Family members at bedside.  Pt. Up in chair at this time.

## 2012-09-27 NOTE — Progress Notes (Signed)
Attempted cognitive linguistic eval x2. Pt in procedure, with social work. Will try again tomorrow. Harlon Ditty, MA CCC-SLP [phone]

## 2012-09-28 LAB — COMPREHENSIVE METABOLIC PANEL
AST: 50 U/L — ABNORMAL HIGH (ref 0–37)
Albumin: 2.7 g/dL — ABNORMAL LOW (ref 3.5–5.2)
Alkaline Phosphatase: 176 U/L — ABNORMAL HIGH (ref 39–117)
CO2: 25 mEq/L (ref 19–32)
Chloride: 106 mEq/L (ref 96–112)
GFR calc non Af Amer: >90 mL/min (ref >90)
Potassium: 3.3 mEq/L — ABNORMAL LOW (ref 3.5–5.1)
Total Bilirubin: 1.7 mg/dL — ABNORMAL HIGH (ref 0.3–1.2)

## 2012-09-28 LAB — CBC
MCH: 32.1 pg (ref 26.0–34.0)
MCHC: 32.6 g/dL (ref 30.0–36.0)
MCV: 98.5 fL (ref 78.0–100.0)
Platelets: 30 10*3/uL — ABNORMAL LOW (ref 150–400)
RDW: 14.8 % (ref 11.5–15.5)

## 2012-09-28 LAB — URINE CULTURE: Colony Count: >=100000

## 2012-09-28 LAB — GLUCOSE, CAPILLARY: Glucose-Capillary: 114 mg/dL — ABNORMAL HIGH (ref 70–99)

## 2012-09-28 MED ORDER — FUROSEMIDE 40 MG PO TABS: 40.0000 mg | ORAL_TABLET | Freq: Two times a day (BID) | ORAL | Status: DC

## 2012-09-28 MED ORDER — CEFUROXIME AXETIL 500 MG PO TABS: 500.0000 mg | ORAL_TABLET | Freq: Two times a day (BID) | ORAL | Status: DC

## 2012-09-28 MED ORDER — CLONAZEPAM 1 MG PO TABS: 1.0000 mg | ORAL_TABLET | Freq: Two times a day (BID) | ORAL | Status: DC

## 2012-09-28 MED ORDER — ZOLPIDEM TARTRATE 10 MG PO TABS
10.0000 mg | ORAL_TABLET | Freq: Once | ORAL | Status: AC
  Administered 2012-09-28: 10 mg via ORAL
  Filled 2012-09-28: qty 1

## 2012-09-28 NOTE — Progress Notes (Signed)
Subjective: No further seizure activity noted.  At baseline per family.    Objective: Current vital signs: BP 124/54  Pulse 74  Temp 98 F (36.7 C) (Oral)  Resp 18  Ht 5\' 7"  (1.702 m)  Wt 88 kg (194 lb 0.1 oz)  BMI 30.39 kg/m2  SpO2 99% Vital signs in last 24 hours: Temp:  [98 F (36.7 C)-98.8 F (37.1 C)] 98 F (36.7 C) (10/29 0952) Pulse Rate:  [71-104] 74  (10/29 0952) Resp:  [16-18] 18  (10/29 0952) BP: (116-139)/(44-77) 124/54 mmHg (10/29 0952) SpO2:  [98 %-100 %] 99 % (10/29 0952)  Intake/Output from previous day: 10/28 0701 - 10/29 0700 In: 1685 [P.O.:960; I.V.:675; IV Piggyback:50] Out: 450 [Urine:450] Intake/Output this shift:   Nutritional status: Carb Control  Neurologic Exam: Mental Status:  Alert, oriented, thought content appropriate. Speech fluent without evidence of aphasia. Can follow simple commands but has difficulty with three step commands.  Cranial Nerves:  II: Discs flat bilaterally; Visual fields grossly normal, pupils equal, round, reactive to light and accommodation  III,IV, VI: ptosis not present, extra-ocular motions intact bilaterally  V,VII: smile symmetric, facial light touch sensation normal bilaterally  VIII: hearing normal bilaterally  IX,X: gag reflex present  XI: bilateral shoulder shrug  XII: midline tongue extension  Motor:  Right : Upper extremity 5/5          Left: Upper extremity 5/5   Lower extremity 5/5       Lower extremity 5/5  Tone and bulk:normal tone throughout; no atrophy noted  Sensory: Pinprick and light touch intact throughout, bilaterally  Deep Tendon Reflexes: 2+ and symmetric with absent AJ's bilaterally  Plantars:  Right: downgoing Left: downgoing  Cerebellar:  normal finger-to-nose and normal heel-to-shin test  CV: pulses palpable throughout    Lab Results: Basic Metabolic Panel:  Lab 09/28/12 5409 09/27/12 0455 09/26/12 1338 09/26/12 0956 09/26/12 0409  NA 141 139 -- -- 137  K 3.3* 3.5 4.0 2.7* 2.8*   CL 106 105 -- -- 94*  CO2 25 26 -- -- 20  GLUCOSE 103* 103* -- -- 154*  BUN 9 9 -- -- 13  CREATININE 0.72 0.71 -- 0.70 0.80  CALCIUM 8.0* 8.0* -- -- 8.6  MG -- -- -- 1.9 --  PHOS -- -- -- -- --    Liver Function Tests:  Lab 09/28/12 0640 09/26/12 0409  AST 50* 74*  ALT 33 37*  ALKPHOS 176* 218*  BILITOT 1.7* 3.7*  PROT 6.2 7.6  ALBUMIN 2.7* 3.4*   No results found for this basename: LIPASE:5,AMYLASE:5 in the last 168 hours No results found for this basename: AMMONIA:3 in the last 168 hours  CBC:  Lab 09/28/12 0640 09/27/12 0455 09/26/12 0956 09/26/12 0409  WBC 3.5* 4.9 8.6 13.4*  NEUTROABS -- -- -- 10.5*  HGB 10.7* 11.9* 11.9* 13.0  HCT 32.8* 34.8* 34.7* 38.0  MCV 98.5 98.9 95.3 94.8  PLT 30* 32* 42* 63*    Cardiac Enzymes: No results found for this basename: CKTOTAL:5,CKMB:5,CKMBINDEX:5,TROPONINI:5 in the last 168 hours  Lipid Panel:  Lab 09/27/12 0455  CHOL 164  TRIG 108  HDL 44  CHOLHDL 3.7  VLDL 22  LDLCALC 98    CBG:  Lab 09/28/12 0635 09/27/12 2131 09/27/12 1603 09/27/12 1204 09/27/12 0815  GLUCAP 80 111* 124* 122* 110*    Microbiology: Results for orders placed during the hospital encounter of 09/26/12  URINE CULTURE     Status: Normal (Preliminary result)   Collection  Time   09/26/12  5:11 AM      Component Value Range Status Comment   Specimen Description URINE, CATHETERIZED   Final    Special Requests NONE   Final    Culture  Setup Time 09/26/2012 18:50   Final    Colony Count >=100,000 COLONIES/ML   Final    Culture ESCHERICHIA COLI   Final    Report Status PENDING   Incomplete   MRSA PCR SCREENING     Status: Normal   Collection Time   09/26/12  8:11 AM      Component Value Range Status Comment   MRSA by PCR NEGATIVE  NEGATIVE Final     Coagulation Studies: No results found for this basename: LABPROT:5,INR:5 in the last 72 hours  Imaging: Mr Brain Wo Contrast  09/26/2012  *RADIOLOGY REPORT*  Clinical Data:  Two seizures in  the past 24 hours.  History of diabetes and cirrhosis.  MRI BRAIN WITHOUT CONTRAST MRA HEAD WITHOUT CONTRAST  Technique: Multiplanar, multiecho pulse sequences of the brain and surrounding structures were obtained according to standard protocol without intravenous contrast.  Angiographic images of the head were obtained using MRA technique without contrast.  Comparison: 09/26/2012 head CT.  No comparison brain MR.  MRI HEAD  Findings:  No acute infarct.  Minimal nonspecific white matter type changes may be related to result of small vessel disease in this diabetic patient.  No evidence of mesial temporal sclerosis.  Small right-sided choroidal fissure cyst.   No intracranial mass lesion otherwise noted on this unenhanced exam.  No intracranial hemorrhage.  Major intracranial vascular structures are patent.  Cervical medullary junction, pituitary region, pineal region and orbital structures unremarkable.  Slight decreased signal intensity of bone marrow may be related to the patient's habitus.  Result of anemia not entirely excluded.  Moderate mucosal thickening right frontal sinus, right maxillary sinus and mid ethmoid sinus air cells bilaterally.  IMPRESSION: No acute infarct.  Minimal nonspecific white matter type changes may be related to result of small vessel disease in this diabetic patient.  No evidence of mesial temporal sclerosis.  Small right-sided choroidal fissure cyst.  Slight decreased signal intensity of bone marrow may be related to the patient's habitus.  Result of anemia not entirely excluded.  Moderate mucosal thickening right frontal sinus, right maxillary sinus and mid ethmoid sinus air cells bilaterally.  MRA HEAD  Findings: Motion degraded exam. This limits evaluation for grading stenosis accurately and detecting small aneurysm appear  Anterior circulation without medium or large size vessel significant stenosis or occlusion.  Right vertebral artery is slightly dominant in size.  No high-grade  stenosis of either vertebral artery or basilar artery.  Poor evaluation of the PICAs secondary to the motion degradation.  Nonvisualization left AICA.  Superior cerebellar artery and posterior cerebral artery mild branch vessel irregularity.  No obvious aneurysm or vascular malformation.  IMPRESSION: Motion degraded examination without large size vessel significant stenosis or occlusion.   Original Report Authenticated By: Fuller Canada, M.D.    Mr Mra Head/brain Wo Cm  09/26/2012  *RADIOLOGY REPORT*  Clinical Data:  Two seizures in the past 24 hours.  History of diabetes and cirrhosis.  MRI BRAIN WITHOUT CONTRAST MRA HEAD WITHOUT CONTRAST  Technique: Multiplanar, multiecho pulse sequences of the brain and surrounding structures were obtained according to standard protocol without intravenous contrast.  Angiographic images of the head were obtained using MRA technique without contrast.  Comparison: 09/26/2012 head CT.  No comparison  brain MR.  MRI HEAD  Findings:  No acute infarct.  Minimal nonspecific white matter type changes may be related to result of small vessel disease in this diabetic patient.  No evidence of mesial temporal sclerosis.  Small right-sided choroidal fissure cyst.   No intracranial mass lesion otherwise noted on this unenhanced exam.  No intracranial hemorrhage.  Major intracranial vascular structures are patent.  Cervical medullary junction, pituitary region, pineal region and orbital structures unremarkable.  Slight decreased signal intensity of bone marrow may be related to the patient's habitus.  Result of anemia not entirely excluded.  Moderate mucosal thickening right frontal sinus, right maxillary sinus and mid ethmoid sinus air cells bilaterally.  IMPRESSION: No acute infarct.  Minimal nonspecific white matter type changes may be related to result of small vessel disease in this diabetic patient.  No evidence of mesial temporal sclerosis.  Small right-sided choroidal fissure cyst.   Slight decreased signal intensity of bone marrow may be related to the patient's habitus.  Result of anemia not entirely excluded.  Moderate mucosal thickening right frontal sinus, right maxillary sinus and mid ethmoid sinus air cells bilaterally.  MRA HEAD  Findings: Motion degraded exam. This limits evaluation for grading stenosis accurately and detecting small aneurysm appear  Anterior circulation without medium or large size vessel significant stenosis or occlusion.  Right vertebral artery is slightly dominant in size.  No high-grade stenosis of either vertebral artery or basilar artery.  Poor evaluation of the PICAs secondary to the motion degradation.  Nonvisualization left AICA.  Superior cerebellar artery and posterior cerebral artery mild branch vessel irregularity.  No obvious aneurysm or vascular malformation.  IMPRESSION: Motion degraded examination without large size vessel significant stenosis or occlusion.   Original Report Authenticated By: Fuller Canada, M.D.     Medications:  I have reviewed the patient's current medications. Scheduled:   . cefUROXime  500 mg Oral BID WC  . clonazePAM  1 mg Oral BID  . insulin aspart  0-5 Units Subcutaneous QHS  . insulin aspart  0-9 Units Subcutaneous TID WC  . nicotine  21 mg Transdermal Daily  . zolpidem  10 mg Oral Once  . DISCONTD: cefTRIAXone (ROCEPHIN)  IV  1 g Intravenous Q24H  . DISCONTD: enoxaparin  40 mg Subcutaneous Q24H  . 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  MRI and EEG of the brain unremarkable   Plan: No recommendation for prophylactic anticonvulsant therapy at this time.  No further neurologic intervention is recommended at this time.  If further questions arise, please call or page at that time.  Thank you for allowing neurology to participate in the care of this patient.    LOS:  2 days   Thana Farr, MD [facility] [phone] 09/28/2012  11:29 AM

## 2012-09-28 NOTE — Progress Notes (Signed)
[facility] Progress Note [place] TEAM 1 - Stepdown/ICU TEAM   Dana Petersen WUJ:[id number] DOB: 1966-05-13 DOA: 09/26/2012 PCP: [name], MD  Brief narrative: 46 year old female with history of cirrhosis, depression, diabetes, GERD, chronic low back pain was sent from [facility] ED for seizures and questionable CVA. History was obtained from the husband. Patient is somewhat postictal and was not able to provide a clear history. Per patient's husband who was at home and witnessed her for seizure which was around 1:15 PM yesterday, grand mal type with, whole body jerking, urinary incontinence and tongue biting and lasted about 3-5 minutes. She was somewhat postictal after the seizure. She had another seizure at 3:30 AM this morning at home after which she presented to [facility] ED. Per patient's husband she had 1 seizure in 2011 which was thought to be from hypoglycemia and she was not placed on any antiseizure medications by her PCP. The patient husband reported that she did not have any fevers, chills or any neck stiffness prior to for seizure yesterday. He had not noticed any slurring of speech or focal weakness yesterday. Patient was given Dilantin loading dose in [facility] ED.  Of note on medication review is listed as Xanax 2 mg q6hours PRN for anxiety/sleep however patient admitted to taking it scheduled 2mg  TID (verified by her husband) and she ran out of Xanax a week ago and oxycodone a month ago. Patient husband reported that they could not fill the prescription.   Assessment/Plan:  SEIZURE, GRAND MAL:  Potentially precipitated by benzodiazepine withdrawal - see recs as per Neuro - awaiting EEG findings  MRI normal Stable on Klonopin now   DIABETES MELLITUS, TYPE II:  CBGs well controlled at this time - A1c 5.3  DEPRESSIVE DISORDER, MODERATE with substance abuse:  Referral to [facility]  HYPERTENSION:  BP is currently well controlled  Chronic LOW BACK PAIN    Continue prn pain meds   E coli UTI (urinary tract infection):  Continue cefuroxime for 1 more day   Nicotine abuse: on nicotine patch   Hypokalemia: secondary to home diuretics - improved w/ replacement - recheck in AM  Cirrhosis of liver without mention of alcohol  Recheck LFTs in AM - followed by GI in outpt setting  Thrombocytopenia Likely due to liver disease - d/c lovenox - f/u in AM  Code Status: Full Disposition Plan: home today, pending EEG, HHPT  Consultants: Neurology  Antibiotics: Rocephin 09/26/2012>>09/27/2012 Ceftin 09/27/2012>>  DVT prophylaxis: Lovenox>>SCDs  HPI/Subjective: Feels well, ready to go home   Objective: Blood pressure 124/54, pulse 74, temperature 98 F (36.7 C), temperature source Oral, resp. rate 18, height 5\' 7"  (1.702 m), weight 88 kg (194 lb 0.1 oz), SpO2 99.00%.  Intake/Output Summary (Last 24 hours) at 09/28/12 1022 Last data filed at 09/28/12 0600  Gross per 24 hour  Intake   1220 ml  Output    450 ml  Net    770 ml     Exam: General: No acute respiratory distress Lungs: Clear to auscultation bilaterally without wheezes or crackles Cardiovascular: Regular rate and rhythm without murmur gallop or rub normal S1 and S2 Abdomen: Nontender, nondistended, soft, bowel sounds positive, no rebound, no ascites, no appreciable mass Extremities: No significant cyanosis, clubbing, or edema bilateral lower extremities  Data Reviewed: Basic Metabolic Panel:  Lab 09/28/12 9562 09/27/12 0455 09/26/12 1338 09/26/12 0956 09/26/12 0409  NA 141 139 -- -- 137  K 3.3* 3.5 4.0 2.7* 2.8*  CL  106 105 -- -- 94*  CO2 25 26 -- -- 20  GLUCOSE 103* 103* -- -- 154*  BUN 9 9 -- -- 13  CREATININE 0.72 0.71 -- 0.70 0.80  CALCIUM 8.0* 8.0* -- -- 8.6  MG -- -- -- 1.9 --  PHOS -- -- -- -- --   Liver Function Tests:  Lab 09/28/12 0640 09/26/12 0409  AST 50* 74*  ALT 33 37*  ALKPHOS 176* 218*  BILITOT 1.7* 3.7*  PROT 6.2 7.6  ALBUMIN 2.7*  3.4*   CBC:  Lab 09/28/12 0640 09/27/12 0455 09/26/12 0956 09/26/12 0409  WBC 3.5* 4.9 8.6 13.4*  NEUTROABS -- -- -- 10.5*  HGB 10.7* 11.9* 11.9* 13.0  HCT 32.8* 34.8* 34.7* 38.0  MCV 98.5 98.9 95.3 94.8  PLT 30* 32* 42* 63*   CBG:  Lab 09/28/12 0635 09/27/12 2131 09/27/12 1603 09/27/12 1204 09/27/12 0815  GLUCAP 80 111* 124* 122* 110*    Recent Results (from the past 240 hour(s))  URINE CULTURE     Status: Normal (Preliminary result)   Collection Time   09/26/12  5:11 AM      Component Value Range Status Comment   Specimen Description URINE, CATHETERIZED   Final    Special Requests NONE   Final    Culture  Setup Time 09/26/2012 18:50   Final    Colony Count >=100,000 COLONIES/ML   Final    Culture ESCHERICHIA COLI   Final    Report Status PENDING   Incomplete   MRSA PCR SCREENING     Status: Normal   Collection Time   09/26/12  8:11 AM      Component Value Range Status Comment   MRSA by PCR NEGATIVE  NEGATIVE Final        Zannie Cove, MD [facility] Office  [phone] Pager [phone]  On-Call/Text Page:      [email or web address]      password [email or web address]  If 7PM-7AM, please contact night-coverage [email or web address] Password [email or web address] 09/28/2012, 10:21 AM   LOS: 2 days

## 2012-09-28 NOTE — Progress Notes (Signed)
Clinical Social Work Progress Note PSYCHIATRY SERVICE LINE 09/28/2012  Patient:  Dana Petersen  Account:  [id number]  Admit Date:  09/26/2012  Clinical Social Worker:  Ronda Fairly, CLINICAL SOCIAL WORKER  Date/Time:  09/28/2012 03:19 PM  Review of Patient  Overall Medical Condition:   Patient stable, about to discharge to family home.  Patient seen in bed with both husband and daughter in room.   Participation Level:  Active  Participation Quality  Attentive   Other Participation Quality:   Affect  Flat   Cognitive  Oriented   Reaction to Medications/Concerns:   Patient feels good about upcoming discharge   Modes of Intervention  Education  Support   Summary of Progress/Plan at Discharge   CSW met with patient in patient's room to discuss Mobile Crisis Management Services and provide contact information for the service.  Also provided contact information for [place] QUIT program which supplies nicotine patches/logenzes free of charge.  Patient described sleep as still minimal and husband stated patient's sleep time for last two nights as he has been observing her sleep due to concern about seizure occurrence.  Patient and family were offered support and acknowledged their appreciation.   Carney Bern, LCSWA

## 2012-09-28 NOTE — Progress Notes (Signed)
Clinical Social Work Progress Note PSYCHIATRY SERVICE LINE 09/28/2012  Patient:  Dana Petersen  Account:  [id number]  Admit Date:  09/26/2012  Clinical Social Worker:  Ronda Fairly, CLINICAL SOCIAL WORKER  Date/Time:  09/28/2012 03:34 PM  Summary of Progress/Plan at Discharge   CSW signing off as patient discharging today     Carney Bern, LCSWA

## 2012-09-28 NOTE — Procedures (Signed)
EEG NUMBER:  REFERRING PHYSICIAN:  Dr. Cleone Slim.  HISTORY:  A 46 year old female with seizures and Xanax and oxycodone withdrawal.  MEDICATIONS:  Rocephin, Klonopin, Lovenox, NovoLog, Protonix, potassium, Mag sulfate.  CONDITIONS OF RECORDING:  This is a 16-channel EEG carried out with the patient in the awake state.  DESCRIPTION:  The waking background activity consists of a low-voltage, symmetrical, fairly well-organized with 11 Hz alpha activity seen from the parieto-occipital and posterotemporal regions.  Low-voltage, fast activity, poorly organized was seen and during at times, superimposed on more posterior rhythms.  A mixture of theta and alpha rhythm was seen from the central and temporal regions.  The patient does not drowse or sleep.  Hyperventilation was not performed.  Intermittent photic stimulation failed to elicit any abnormalities.  IMPRESSION:  This is a normal awake EEG.  COMMENT:  An EEG with the patient sleep deprived to elicit drowse and light sleep maybe desirable to further elicit a possible seizure disorder.          ______________________________ Thana Farr, MD    [id number] D:  09/28/2012 08:36:17  T:  09/28/2012 22:25:28  Job #:  [id number]

## 2012-09-28 NOTE — Care Management Note (Signed)
    Page 1 of 2   09/28/2012     4:02:39 PM   CARE MANAGEMENT NOTE 09/28/2012  Patient:  TYLAN, DIPIPPO   Account Number:  [id number]  Date Initiated:  09/28/2012  Documentation initiated by:  [facility]  Subjective/Objective Assessment:   CVA     Action/Plan:   lives at home with husband, no insurance coverage   Anticipated DC Date:  09/29/2012   Anticipated DC Plan:  HOME W HOME HEALTH SERVICES      DC Planning Services  CM consult  Medication Assistance  Indigent [facility] Clinic      [facility] Choice  HOME HEALTH   Choice offered to / List presented to:  C-3 Spouse        HH arranged  HH-2 PT      HH agency  [facility]   Status of service:  Completed, signed off Medicare Important Message given?   (If response is "NO", the following Medicare IM given date fields will be blank) Date Medicare IM given:   Date Additional Medicare IM given:    Discharge Disposition:  HOME W HOME HEALTH SERVICES  Per UR Regulation:    If discussed at Long Length of Stay Meetings, dates discussed:    Comments:  09/28/2012 1200 NCM spoke to husband. Currently she has no insurance coverage at this time. Contacted [facility] for HHPT. Pt agreeable to [facility] PT and completing financial hardship paperwork. No DME needed at home. Explained he can purchase a cane from any drug store or DME supplier. Contacted main pharmacy and pt eligible for [employer] med assistance fund. Will use fund for complete dose of abx and 3 day supply of Lasix. Explained we could not fill Rx for Klonopin. Provided pt with community resource packet that provides info on PCP that accept self pay pt and community discount card that may assist with out of pocket meds. Explained discount pharmacy such as [facility], [facility] and [facility] offer discount prices on generic meds. Pt goes to [facility] for meds. Isidoro Donning RN CCM Case Mgmt phone [phone]

## 2012-09-28 NOTE — Progress Notes (Signed)
Pt waiting for RX. To be filled before going home.

## 2012-09-29 LAB — GLUCOSE, CAPILLARY
Glucose-Capillary: 107 mg/dL — ABNORMAL HIGH (ref 70–99)
Glucose-Capillary: 108 mg/dL — ABNORMAL HIGH (ref 70–99)

## 2012-10-01 ENCOUNTER — Telehealth: Payer: Self-pay | Admitting: Family Medicine

## 2012-10-01 MED ORDER — OXYCODONE HCL 20 MG PO TABS: 20.0000 mg | ORAL_TABLET | Freq: Three times a day (TID) | ORAL | Status: DC | PRN

## 2012-10-01 NOTE — Telephone Encounter (Signed)
done

## 2012-10-01 NOTE — Telephone Encounter (Signed)
Script is ready for pick up and spoke with pt. 

## 2012-10-01 NOTE — Telephone Encounter (Signed)
Daughter calling, requesting a written rx for Deitra's Oxycodone HCl 20 MG TABS States her dad can pick it up, and they'd like it to be done today. I informed her our turn around time is up to 72hrs, but would be at MD's discretion. Rx last written for #90 on 08/25/12. Please call when ready for pick up.

## 2012-10-06 NOTE — Discharge Summary (Signed)
Physician Discharge Summary  Patient ID: Dana Petersen MRN: [id number] DOB/AGE: January 09, 1966 46 y.o.  Admit date: 09/26/2012 Discharge date: 09/29/2012  Primary Care Physician:  [name], MD  FU: PCP in 1 week  Discharge Diagnoses:    Principal Problem:  *SEIZURE, GRAND MAL Active Problems:  HYPERTENSION  GERD  Liver cirrhosis  LOW BACK PAIN  UTI (urinary tract infection)  Nicotine abuse  Hypokalemia      Medication List     As of 10/06/2012 12:51 PM    STOP taking these medications         alprazolam 2 MG tablet   Commonly known as: XANAX      TAKE these medications         cefUROXime 500 MG tablet   Commonly known as: CEFTIN   Take 1 tablet (500 mg total) by mouth 2 (two) times daily with a meal. For 1 day      clonazePAM 1 MG tablet   Commonly known as: KLONOPIN   Take 1 tablet (1 mg total) by mouth 2 (two) times daily.      cloNIDine 0.1 MG tablet   Commonly known as: CATAPRES   Take 1 tablet (0.1 mg total) by mouth 2 (two) times daily.      furosemide 40 MG tablet   Commonly known as: LASIX   Take 1 tablet (40 mg total) by mouth 2 (two) times daily.      ibuprofen 200 MG tablet   Commonly known as: ADVIL,MOTRIN   Take 600 mg by mouth every 6 (six) hours as needed. for body aches.      metFORMIN 1000 MG tablet   Commonly known as: GLUCOPHAGE   Take 1,000 mg by mouth 2 (two) times daily with a meal.         Disposition and Follow-up:  PCP in 1 week  Consults:  Dr.Reynolds, Neurology Psychiatry, Dr.Bogard   Significant Diagnostic Studies:  No results found.  Brief H and P: 46 year old female with history of cirrhosis, depression, diabetes, GERD, chronic low back pain was sent from [facility] ED for seizures and questionable CVA. History was obtained from the husband. Patient is somewhat postictal and was not able to provide a clear history. Per patient's husband who was at home and witnessed her for seizure which was around 1:15 PM  yesterday, grand mal type with, whole body jerking, urinary incontinence and tongue biting and lasted about 3-5 minutes. She was somewhat postictal after the seizure. She had another seizure at 3:30 AM this morning at home after which she presented to [facility] ED. Per patient's husband she had 1 seizure in 2011 which was thought to be from hypoglycemia and she was not placed on any antiseizure medications by her PCP. The patient husband reported that she did not have any fevers, chills or any neck stiffness prior to for seizure yesterday. He had not noticed any slurring of speech or focal weakness yesterday. Patient was given Dilantin loading dose in [facility] ED.  Of note on medication review is listed as Xanax 2 mg q6hours PRN for anxiety/sleep however patient admitted to taking it scheduled 2mg  TID (verified by her husband) and she ran out of Xanax a week ago and oxycodone a month ago.    Hospital Course:  SEIZURE, GRAND MAL:  Potentially precipitated by benzodiazepine withdrawal  EEG normal  MRI normal  No need for antiepileptic agents, seen and followed by Neurology Stable on Klonopin now   DIABETES MELLITUS,  TYPE II:  CBGs well controlled at this time - A1c 5.3   DEPRESSIVE DISORDER, MODERATE with substance abuse:  Referral to [facility]   HYPERTENSION:  BP is currently well controlled   Chronic LOW BACK PAIN  Continue prn pain meds   E coli UTI (urinary tract infection):  Continue cefuroxime for 1 more day    Cirrhosis of liver without mention of alcohol   - followed by GI in outpt setting   Thrombocytopenia  Likely due to liver disease   Time spent on Discharge: Signed: Zayley Arras [facility]  10/06/2012, 12:51 PM

## 2012-10-08 ENCOUNTER — Ambulatory Visit (INDEPENDENT_AMBULATORY_CARE_PROVIDER_SITE_OTHER): Payer: Self-pay | Admitting: Family Medicine

## 2012-10-08 ENCOUNTER — Encounter: Payer: Self-pay | Admitting: Family Medicine

## 2012-10-08 VITALS — BP 138/82 | HR 120 | Temp 98.5°F | Wt 191.0 lb

## 2012-10-08 DIAGNOSIS — F331 Major depressive disorder, recurrent, moderate: Secondary | ICD-10-CM

## 2012-10-08 DIAGNOSIS — E119 Type 2 diabetes mellitus without complications: Secondary | ICD-10-CM

## 2012-10-08 DIAGNOSIS — G40309 Generalized idiopathic epilepsy and epileptic syndromes, not intractable, without status epilepticus: Secondary | ICD-10-CM

## 2012-10-08 DIAGNOSIS — I10 Essential (primary) hypertension: Secondary | ICD-10-CM

## 2012-10-08 DIAGNOSIS — F419 Anxiety disorder, unspecified: Secondary | ICD-10-CM | POA: Insufficient documentation

## 2012-10-08 DIAGNOSIS — F411 Generalized anxiety disorder: Secondary | ICD-10-CM

## 2012-10-08 MED ORDER — ALPRAZOLAM 2 MG PO TABS: 2.0000 mg | ORAL_TABLET | Freq: Four times a day (QID) | ORAL | Status: DC | PRN

## 2012-10-08 MED ORDER — FLUOXETINE HCL 40 MG PO CAPS: 40.0000 mg | ORAL_CAPSULE | Freq: Two times a day (BID) | ORAL | Status: DC

## 2012-10-08 NOTE — Progress Notes (Signed)
  Subjective:    Patient ID: Dana Petersen, female    DOB: 1966/08/28, 46 y.o.   MRN: [id number]  HPI Here to follow up a hospital stay from 09-26-12 to 09-29-12 for 2 grand mal seizures. These were presumed to be from the sudden stopping of Xanax about one week before these occurred. Her workup was other wise normal, including a brain MRI and an EEG. Neurology decided that she did not need to start on anti-seizure medications. Her BP was stable, and her A1c was excellent at 5.3. She has lost a lot of weight in the past year. She was put on Clonazepam instead of Xanax, but she is not happy with this at all. She feels very nervous all the time. She is out of Prozac also.    Review of Systems  Constitutional: Negative.   Respiratory: Negative.   Cardiovascular: Negative.   Neurological: Negative.   Psychiatric/Behavioral: Negative for hallucinations, behavioral problems, confusion, dysphoric mood, decreased concentration and agitation. The patient is nervous/anxious.        Objective:   Physical Exam  Constitutional: She is oriented to person, place, and time. She appears well-developed and well-nourished.  Cardiovascular: Normal rate, regular rhythm, normal heart sounds and intact distal pulses.   Pulmonary/Chest: Effort normal and breath sounds normal.  Neurological: She is alert and oriented to person, place, and time.  Psychiatric: She has a normal mood and affect. Her behavior is normal. Thought content normal.          Assessment & Plan:  She has had no further seizure activity. I am reasonably certain her seizures resulted in her sudden stopping of the Xanax. Her husband explains that this occurred because they ran out of money and could not afford to get more until his next paycheck came in. She has reapplied for disability and Medicaid, and now she has an attorney helping her. We decided to get back on Xanax 4 times a day and on Prozac, and to stop the Clonazepam. She has  always used the Xanax responsibly and only as prescribed. I cautioned her that she can never abruptly stop this medication and they understand.

## 2012-10-19 ENCOUNTER — Telehealth: Payer: Self-pay | Admitting: Family Medicine

## 2012-10-19 NOTE — Telephone Encounter (Signed)
NO it is too early. She is not due until 10-31-12

## 2012-10-19 NOTE — Telephone Encounter (Signed)
Pt calling, requesting a refill on Oxycodone HCl 20 MG TABS for 30 days. Please call her when ready and her husband will pick up.

## 2012-10-20 NOTE — Telephone Encounter (Signed)
I spoke with pt  

## 2012-10-26 ENCOUNTER — Telehealth: Payer: Self-pay | Admitting: Family Medicine

## 2012-10-26 MED ORDER — OXYCODONE HCL 20 MG PO TABS: 20.0000 mg | ORAL_TABLET | Freq: Three times a day (TID) | ORAL | Status: DC | PRN

## 2012-10-26 MED ORDER — FUROSEMIDE 40 MG PO TABS: 40.0000 mg | ORAL_TABLET | Freq: Two times a day (BID) | ORAL | Status: DC

## 2012-10-26 MED ORDER — ALPRAZOLAM 2 MG PO TABS: 2.0000 mg | ORAL_TABLET | Freq: Four times a day (QID) | ORAL | Status: DC | PRN

## 2012-10-26 MED ORDER — FLUOXETINE HCL 40 MG PO CAPS: 40.0000 mg | ORAL_CAPSULE | Freq: Two times a day (BID) | ORAL | Status: DC

## 2012-10-26 MED ORDER — CLONIDINE HCL 0.1 MG PO TABS: 0.1000 mg | ORAL_TABLET | Freq: Two times a day (BID) | ORAL | Status: DC

## 2012-10-26 NOTE — Telephone Encounter (Signed)
The oxycodone was written. Call in 6 months of the other three meds

## 2012-10-26 NOTE — Telephone Encounter (Signed)
Pt called and is req call back from nurse. Pt would not give any detail, as to what this is regarding. Pls call.

## 2012-10-26 NOTE — Telephone Encounter (Signed)
I did cancel previous scripts and called in scripts. Per Dr. Clent Ridges okay to give a 90 day supply and I spoke with pt.

## 2012-10-26 NOTE — Telephone Encounter (Signed)
Pt was in hosp 09/26/12 re: seizures from being off medications. Pt has been in for ov to see Dr Clent Ridges since being out of hospital. Pt checked with [facility] in [facility], and was told that she can get her meds cheaper there. Pt needs refills on alprazolam (XANAX) 2 MG tablet,  FLUoxetine (PROZAC) 40 MG capsule,  furosemide (LASIX) 40 MG tablet to [facility] in [place]. Pt also needs to pick up script for the Oxycodone HCl 20 MG TABS.

## 2012-10-26 NOTE — Telephone Encounter (Signed)
I spoke with pt and she is requesting a

## 2012-10-29 ENCOUNTER — Other Ambulatory Visit: Payer: Self-pay | Admitting: Family Medicine

## 2012-10-29 NOTE — Telephone Encounter (Signed)
Pt needs new refill alprazolam 2 mg #120 call into [facility] [phone]. Pt will no longer get this med refill at [facility] due to cost

## 2012-11-01 MED ORDER — ALPRAZOLAM 2 MG PO TABS: 2.0000 mg | ORAL_TABLET | Freq: Four times a day (QID) | ORAL | Status: DC | PRN

## 2012-11-01 NOTE — Telephone Encounter (Signed)
Pt never got rx refill at [facility] due to cost 178.00

## 2012-11-01 NOTE — Telephone Encounter (Signed)
NO we changed this for her last week and we cannot change it again

## 2012-11-01 NOTE — Telephone Encounter (Signed)
Per Dr. Clent Ridges, okay to change pharmacies. I did call in script and also asked if they could phone pt when ready for pick up.

## 2012-11-18 ENCOUNTER — Telehealth: Payer: Self-pay | Admitting: Family Medicine

## 2012-11-18 NOTE — Telephone Encounter (Signed)
Patient called stating that she need a refill of her oxycodone 20mg  1po tid prn for pain. Please assist.

## 2012-11-19 NOTE — Telephone Encounter (Signed)
NO it is not due until 11-25-12

## 2012-11-19 NOTE — Telephone Encounter (Signed)
LMOM

## 2012-11-19 NOTE — Telephone Encounter (Signed)
Can you call pt and give her the below message. She needs to call back 11/25/12 in the am and it will be ready around lunch time. It is too soon to fill now?

## 2012-11-19 NOTE — Telephone Encounter (Signed)
Pt would like to know about refill. [phone]

## 2012-11-19 NOTE — Telephone Encounter (Signed)
Pt will call back on 11-25-2012

## 2012-11-26 ENCOUNTER — Other Ambulatory Visit: Payer: Self-pay | Admitting: Family Medicine

## 2012-11-26 NOTE — Telephone Encounter (Signed)
Pt needs new rx oxycodone 20 mg. Pt is out

## 2012-11-29 MED ORDER — OXYCODONE HCL 20 MG PO TABS: 20.0000 mg | ORAL_TABLET | Freq: Three times a day (TID) | ORAL | Status: DC | PRN

## 2012-11-29 NOTE — Telephone Encounter (Signed)
Okay per Dr. Caryl Never and I did print script and pt's husband picked it up.

## 2012-11-29 NOTE — Telephone Encounter (Signed)
Pt following up on med refill. Pt would like for husband to pick up script after his 11am  appt at another phy. Office today.  Thanks.

## 2012-12-10 ENCOUNTER — Telehealth: Payer: Self-pay | Admitting: Family Medicine

## 2012-12-10 MED ORDER — CIPROFLOXACIN HCL 500 MG PO TABS: 500.0000 mg | ORAL_TABLET | Freq: Two times a day (BID) | ORAL | Status: DC

## 2012-12-10 NOTE — Telephone Encounter (Signed)
I sent script e-scribe and I tried to reach pt by phone, no answer or option to leave a message.

## 2012-12-10 NOTE — Telephone Encounter (Signed)
Pt has uti requesting something [facility] into [facility] [place]/ pt decline appt

## 2012-12-10 NOTE — Telephone Encounter (Signed)
Call in Cipro 500 mg bid for 7 days  

## 2012-12-30 ENCOUNTER — Other Ambulatory Visit: Payer: Self-pay | Admitting: Family Medicine

## 2012-12-30 MED ORDER — OXYCODONE HCL 20 MG PO TABS: 20.0000 mg | ORAL_TABLET | Freq: Three times a day (TID) | ORAL | Status: DC | PRN

## 2012-12-30 NOTE — Telephone Encounter (Signed)
done

## 2012-12-30 NOTE — Telephone Encounter (Signed)
Pt needs new rx oxycodone 20 mg. Pt is out °

## 2012-12-30 NOTE — Telephone Encounter (Signed)
Script is ready for pick up, tried to reach pt by phone, no answer. 

## 2013-01-24 ENCOUNTER — Telehealth: Payer: Self-pay | Admitting: Family Medicine

## 2013-01-24 NOTE — Telephone Encounter (Signed)
[name] husband called to inform you pt has some teeth pulled  and woild like to have pain med refilled early. Dentist would not give pt pain meds except w/ tylenol, and pt cannot take Tylenol. Dr Delane Ginger said that's all he had. Pt would like refill of  Oxycodone HCl 20 MG TABS and to pick up Tues or Wed. Pls advise.

## 2013-01-25 ENCOUNTER — Telehealth: Payer: Self-pay | Admitting: Family Medicine

## 2013-01-25 MED ORDER — OXYCODONE HCL 20 MG PO TABS: 20.0000 mg | ORAL_TABLET | Freq: Three times a day (TID) | ORAL | Status: DC | PRN

## 2013-01-25 NOTE — Telephone Encounter (Signed)
This is a duplicate.

## 2013-01-25 NOTE — Telephone Encounter (Signed)
Patient called stating that in order to refill her oxycodone 20 mg early the pharmacy requires a written script. Please assist.

## 2013-01-25 NOTE — Telephone Encounter (Signed)
Script is ready for pick up, tried to reach pt and no answer.  

## 2013-01-25 NOTE — Telephone Encounter (Signed)
done

## 2013-02-15 ENCOUNTER — Telehealth: Payer: Self-pay | Admitting: Family Medicine

## 2013-02-15 NOTE — Telephone Encounter (Signed)
Per pt daughter(Dana Petersen), pt had teeth extraction recently and Dentist will not prescribe any pain medication.Pt is requesting pain med call into [facility] [phone]

## 2013-02-15 NOTE — Telephone Encounter (Signed)
Pt is req early refill on rx oxycodone

## 2013-02-16 ENCOUNTER — Telehealth: Payer: Self-pay | Admitting: *Deleted

## 2013-02-16 NOTE — Telephone Encounter (Signed)
NO early refills  

## 2013-02-16 NOTE — Telephone Encounter (Signed)
I left voice message with below information. 

## 2013-02-16 NOTE — Telephone Encounter (Signed)
Pt called again asking for refill on Oxycodone. Told pt she can not have a refill, it is too early last Rx was filled 2/25. Told pt can not have Rx refilled to 3/25. Pt had slurred speech when speaking with her and sounds out of it. Pt verbalized understanding and stated okay.

## 2013-02-22 ENCOUNTER — Telehealth: Payer: Self-pay | Admitting: Family Medicine

## 2013-02-22 MED ORDER — OXYCODONE HCL 20 MG PO TABS: 20.0000 mg | ORAL_TABLET | Freq: Three times a day (TID) | ORAL | Status: DC | PRN

## 2013-02-22 NOTE — Telephone Encounter (Signed)
Pt called requesting Oxycodone refill. She stated that she's not asking for it early, but only wants to make sure it is filled when its time to be. She once again sounded extremely out of it, with slurred speech. PT repeated herself for 4 minutes stating that she wasn't a drug addict, she just needed her medication. Please assist.

## 2013-02-22 NOTE — Telephone Encounter (Signed)
done

## 2013-02-22 NOTE — Telephone Encounter (Signed)
Script is ready for pick up and I tried to reach pt, no answer.  

## 2013-03-21 ENCOUNTER — Telehealth: Payer: Self-pay | Admitting: Family Medicine

## 2013-03-21 MED ORDER — OXYCODONE HCL 20 MG PO TABS: 20.0000 mg | ORAL_TABLET | Freq: Three times a day (TID) | ORAL | Status: DC | PRN

## 2013-03-21 NOTE — Telephone Encounter (Signed)
Pt needs new rx oxycodone °

## 2013-03-21 NOTE — Telephone Encounter (Signed)
Left a message for pt that rx is ready for pick up.  

## 2013-03-21 NOTE — Telephone Encounter (Signed)
done

## 2013-03-31 ENCOUNTER — Other Ambulatory Visit: Payer: Self-pay | Admitting: Family Medicine

## 2013-04-01 NOTE — Telephone Encounter (Signed)
Patient Information:  Caller Name: Kalene  Phone: [phone]  Patient: Dana Petersen, Dana Petersen  Gender: Female  DOB: March 11, 1966  Age: 47 Years  PCP: Gershon Crane [facility])  Pregnant: No  Office Follow Up:  Does the office need to follow up with this patient?: Yes  Instructions For The Office: Pt declines appt, says not able to come to office because of other appts today.  Would like refill on Cipro that MD prescribed in January for UTI.  [facility]arm[facility]ay is [facility] [phone].  PLEASE CALL PT BACK AT [phone] TO ADVISE IF MD WILL CALL IN MEDICATION.  Thanks.   Symptoms  Reason For Call & Symptoms: Pt calling today 04/01/13 regarding having urinary pain and burning.  Requesting refill on her Cipro.  Reviewed Health History In EMR: Yes  Reviewed Medications In EMR: Yes  Reviewed Allergies In EMR: Yes  Reviewed Surgeries / Procedures: Yes  Date of Onset of Symptoms: 03/31/2013 OB / GYN:  LMP: 03/25/2013  Guideline(s) Used:  Urination Pain - Female  Disposition Per Guideline:   See Today in Office  Reason For Disposition Reached:   Painful urination AND EITHER frequency or urgency  Advice Given:  N/A  Patient Refused Recommendation:  Patient Requests Prescription  Pt request refill on her Cipro

## 2013-04-01 NOTE — Telephone Encounter (Signed)
I left voice message, Dr. Clent Ridges has already left for the day. I advised pt to schedule a office visit so that we could get a  urine sample. If not then she should go to Urgent Care to get checked out.

## 2013-04-05 ENCOUNTER — Ambulatory Visit (INDEPENDENT_AMBULATORY_CARE_PROVIDER_SITE_OTHER): Payer: Self-pay | Admitting: Family Medicine

## 2013-04-05 ENCOUNTER — Encounter: Payer: Self-pay | Admitting: Family Medicine

## 2013-04-05 VITALS — BP 148/80 | HR 85 | Temp 98.2°F | Wt 198.0 lb

## 2013-04-05 DIAGNOSIS — N390 Urinary tract infection, site not specified: Secondary | ICD-10-CM

## 2013-04-05 DIAGNOSIS — E119 Type 2 diabetes mellitus without complications: Secondary | ICD-10-CM

## 2013-04-05 DIAGNOSIS — K7460 Unspecified cirrhosis of liver: Secondary | ICD-10-CM

## 2013-04-05 LAB — CBC WITH DIFFERENTIAL/PLATELET
Basophils Absolute: 0.1 10*3/uL (ref 0.0–0.1)
Eosinophils Relative: 2.7 % (ref 0.0–5.0)
HCT: 33.3 % — ABNORMAL LOW (ref 36.0–46.0)
Lymphs Abs: 0.6 10*3/uL — ABNORMAL LOW (ref 0.7–4.0)
MCV: 97.5 fl (ref 78.0–100.0)
Monocytes Absolute: 0.1 10*3/uL (ref 0.1–1.0)
Neutrophils Relative %: 64.6 % (ref 43.0–77.0)
Platelets: 28 10*3/uL — CL (ref 150.0–400.0)
RDW: 17.4 % — ABNORMAL HIGH (ref 11.5–14.6)
WBC: 2.4 10*3/uL — ABNORMAL LOW (ref 4.5–10.5)

## 2013-04-05 LAB — BASIC METABOLIC PANEL
BUN: 8 mg/dL (ref 6–23)
Calcium: 8.1 mg/dL — ABNORMAL LOW (ref 8.4–10.5)
GFR: 87.96 mL/min (ref >60.00)
Glucose, Bld: 103 mg/dL — ABNORMAL HIGH (ref 70–99)
Sodium: 138 mEq/L (ref 135–145)

## 2013-04-05 LAB — HEPATIC FUNCTION PANEL
ALT: 35 U/L (ref 0–35)
AST: 57 U/L — ABNORMAL HIGH (ref 0–37)
Bilirubin, Direct: 0.5 mg/dL — ABNORMAL HIGH (ref 0.0–0.3)
Total Bilirubin: 1.8 mg/dL — ABNORMAL HIGH (ref 0.3–1.2)
Total Protein: 6.8 g/dL (ref 6.0–8.3)

## 2013-04-05 LAB — POCT URINALYSIS DIPSTICK
Blood, UA: NEGATIVE
Nitrite, UA: NEGATIVE
Protein, UA: NEGATIVE
Urobilinogen, UA: 1
pH, UA: 8

## 2013-04-05 LAB — TSH: TSH: 1.65 u[IU]/mL (ref 0.35–5.50)

## 2013-04-05 LAB — HEMOGLOBIN A1C: Hgb A1c MFr Bld: 4.5 % — ABNORMAL LOW (ref 4.6–6.5)

## 2013-04-05 LAB — LIPID PANEL
HDL: 65.8 mg/dL (ref >39.00)
VLDL: 18.8 mg/dL (ref 0.0–40.0)

## 2013-04-05 MED ORDER — CIPROFLOXACIN HCL 500 MG PO TABS: 500.0000 mg | ORAL_TABLET | Freq: Two times a day (BID) | ORAL | Status: DC

## 2013-04-05 NOTE — Progress Notes (Signed)
  Subjective:    Patient ID: Dana Petersen, female    DOB: October 30, 1966, 47 y.o.   MRN: [id number]  HPI Here for one week of low back aches, urinary burning and increased frequency. No fever. Drinking plenty of water.    Review of Systems  Constitutional: Negative.   Gastrointestinal: Negative.   Genitourinary: Positive for dysuria, urgency and frequency. Negative for hematuria and flank pain.       Objective:   Physical Exam  Constitutional: She appears well-developed and well-nourished. No distress.  Abdominal: Soft. Bowel sounds are normal. She exhibits no distension and no mass. There is no tenderness. There is no rebound and no guarding.          Assessment & Plan:  Treat with Cipro and culture the urine.

## 2013-04-05 NOTE — Addendum Note (Signed)
Addended by: Aniceto Boss A on: 04/05/2013 01:17 PM   Modules accepted: Orders

## 2013-04-07 LAB — URINE CULTURE

## 2013-04-11 NOTE — Progress Notes (Signed)
Quick Note:  I spoke with pt and she is will schedule office visit with below doctor. ______

## 2013-04-12 ENCOUNTER — Encounter: Payer: Self-pay | Admitting: *Deleted

## 2013-04-12 ENCOUNTER — Other Ambulatory Visit: Payer: Self-pay | Admitting: Oncology

## 2013-04-12 DIAGNOSIS — K7460 Unspecified cirrhosis of liver: Secondary | ICD-10-CM

## 2013-04-12 DIAGNOSIS — D696 Thrombocytopenia, unspecified: Secondary | ICD-10-CM

## 2013-04-12 NOTE — Progress Notes (Unsigned)
Nettie Elm at dr fry's office calling to say dr fry would like for dr Clelia Croft to see patient, d/t labs. Per dr Clelia Croft, pof to the front, for patient to see kristin curcio.

## 2013-04-13 ENCOUNTER — Telehealth: Payer: Self-pay | Admitting: Oncology

## 2013-04-13 NOTE — Telephone Encounter (Signed)
LMONVM ADVISING THE PT OF HER MAY APPTS

## 2013-04-19 ENCOUNTER — Telehealth: Payer: Self-pay | Admitting: Oncology

## 2013-04-19 ENCOUNTER — Other Ambulatory Visit (HOSPITAL_BASED_OUTPATIENT_CLINIC_OR_DEPARTMENT_OTHER): Payer: Self-pay | Admitting: Lab

## 2013-04-19 ENCOUNTER — Telehealth: Payer: Self-pay | Admitting: *Deleted

## 2013-04-19 ENCOUNTER — Other Ambulatory Visit: Payer: Self-pay | Admitting: Oncology

## 2013-04-19 ENCOUNTER — Encounter: Payer: Self-pay | Admitting: Oncology

## 2013-04-19 ENCOUNTER — Ambulatory Visit (HOSPITAL_BASED_OUTPATIENT_CLINIC_OR_DEPARTMENT_OTHER): Payer: Self-pay | Admitting: Oncology

## 2013-04-19 VITALS — BP 124/70 | HR 82 | Temp 97.5°F | Resp 18 | Ht 67.0 in | Wt 184.4 lb

## 2013-04-19 DIAGNOSIS — A5059 Other late congenital syphilis, symptomatic: Secondary | ICD-10-CM

## 2013-04-19 DIAGNOSIS — E876 Hypokalemia: Secondary | ICD-10-CM

## 2013-04-19 DIAGNOSIS — D696 Thrombocytopenia, unspecified: Secondary | ICD-10-CM

## 2013-04-19 DIAGNOSIS — D649 Anemia, unspecified: Secondary | ICD-10-CM

## 2013-04-19 DIAGNOSIS — K7460 Unspecified cirrhosis of liver: Secondary | ICD-10-CM

## 2013-04-19 HISTORY — DX: Thrombocytopenia, unspecified: D69.6

## 2013-04-19 HISTORY — DX: Anemia, unspecified: D64.9

## 2013-04-19 LAB — CBC WITH DIFFERENTIAL/PLATELET
BASO%: 0.3 % (ref 0.0–2.0)
Basophils Absolute: 0 10*3/uL (ref 0.0–0.1)
EOS%: 3.3 % (ref 0.0–7.0)
HGB: 12.3 g/dL (ref 11.6–15.9)
MCH: 32.6 pg (ref 25.1–34.0)
MCHC: 34.2 g/dL (ref 31.5–36.0)
MCV: 95.4 fL (ref 79.5–101.0)
MONO%: 5.3 % (ref 0.0–14.0)
RBC: 3.77 10*6/uL (ref 3.70–5.45)
RDW: 15.8 % — ABNORMAL HIGH (ref 11.2–14.5)
lymph#: 0.5 10*3/uL — ABNORMAL LOW (ref 0.9–3.3)

## 2013-04-19 LAB — COMPREHENSIVE METABOLIC PANEL (CC13)
ALT: 32 U/L (ref 0–55)
AST: 52 U/L — ABNORMAL HIGH (ref 5–34)
Albumin: 3 g/dL — ABNORMAL LOW (ref 3.5–5.0)
Alkaline Phosphatase: 235 U/L — ABNORMAL HIGH (ref 40–150)
BUN: 11 mg/dL (ref 7.0–26.0)
Potassium: 3 mEq/L — CL (ref 3.5–5.1)

## 2013-04-19 MED ORDER — POTASSIUM CHLORIDE CRYS ER 20 MEQ PO TBCR: 20.0000 meq | EXTENDED_RELEASE_TABLET | Freq: Every day | ORAL | Status: DC

## 2013-04-19 NOTE — Telephone Encounter (Signed)
Spoke with patient, needed  Drug store to call in potassium, 1 month's worth, then she is to call her primary physician for f/u care. Patient verbalized understanding.

## 2013-04-19 NOTE — Telephone Encounter (Signed)
Message copied by Reesa Chew on Tue Apr 19, 2013  2:30 PM ------      Message from: Myrtis Ser      Created: Tue Apr 19, 2013  1:38 PM      Regarding: Please call re: K+       Dana Petersen            Please call [name] (you may need to speak with her daughter). K+ is low (3.0). I am going to send a prescription for KDur 20 Meq daily to her pharmacy.             I need to know what pharmacy she wants me to send to.            She will need to f/u with PCP for long-term management of her K+ as this is not related to her thrombocytopenia. ------

## 2013-04-19 NOTE — Telephone Encounter (Signed)
gv and printed appt sched and avs for opt....Charlsie Merles referral

## 2013-04-19 NOTE — Progress Notes (Signed)
Hematology and Oncology Follow Up Visit  Dana Petersen [id number] 1966/11/08 47 y.o. 04/19/2013 1:46 PM Dana Petersen, MDFry, Tera Mater, MD   Principle Diagnosis: Anemia and thrombocytopenia  Current therapy: Watchful observation  Interim History:  [name] returns for followup per request of her primary care provider. She was last seen in our office in November 2012 for thrombocytopenia. She failed to follow up with [place] as scheduled in 3 months later. Patient had routine lab work performed at her primary care provider's office 2 weeks ago. Platelet count was 28,000. Hemoglobin was 11.1. Total white count was low at 2.4 ANC was normal. Patient reports that she has a lot of fatigue. Denies chest pain and shortness of breath at rest. She reports mild dyspnea on exertion. Denies abdominal pain. No nausea or vomiting. Denies epistaxis, hematochezia, melena, and hematuria. States she has not required any blood or platelet transfusions since he last saw her in November 2012. At her last visit with [place] we recommend that she followup with a hepatologist regarding her cirrhosis. She states that she has not done this. She has however been seen by gastroenterology and had an upper endoscopy. The last upper endoscopy available to me was in October 2009 which showed no obvious varices at that time.  Medications: I have reviewed the patient's current medications.  Current Outpatient Prescriptions  Medication Sig Dispense Refill  . alprazolam (XANAX) 2 MG tablet Take 1 tablet (2 mg total) by mouth every 6 (six) hours as needed for anxiety.  360 tablet  1  . cloNIDine (CATAPRES) 0.1 MG tablet Take 1 tablet (0.1 mg total) by mouth 2 (two) times daily.  180 tablet  3  . FLUoxetine (PROZAC) 40 MG capsule Take 1 capsule (40 mg total) by mouth 2 (two) times daily.  180 capsule  1  . furosemide (LASIX) 40 MG tablet Take 1 tablet (40 mg total) by mouth 2 (two) times daily.  180 tablet  3  . ibuprofen (ADVIL,MOTRIN)  200 MG tablet Take 600 mg by mouth every 6 (six) hours as needed. for body aches.      . metFORMIN (GLUCOPHAGE) 1000 MG tablet Take 1,000 mg by mouth 2 (two) times daily with a meal.      . Oxycodone HCl 20 MG TABS Take 1 tablet (20 mg total) by mouth 3 (three) times daily as needed (pain ). For pain  90 tablet  0   No current facility-administered medications for this visit.     Allergies:  Allergies  Allergen Reactions  . Aspirin Other (See Comments)    Causes elevated liver enzymes  . Phenergan (Promethazine Hcl) Other (See Comments)    Muscle spasms  . Tylenol (Acetaminophen)     Caused elevated liver enzymes    Past Medical History, Surgical history, Social history, and Family History were reviewed and updated.  Review of Systems: Constitutional:  Negative for fever, chills, night sweats, anorexia, weight loss, pain. Cardiovascular: positive for - dyspnea on exertion negative for - chest pain, edema or shortness of breath Respiratory: no cough, shortness of breath, or wheezing Neurological: no TIA or stroke symptoms Dermatological: negative ENT: negative Skin: Negative. Gastrointestinal: no abdominal pain, change in bowel habits, or black or bloody stools Genito-Urinary: no dysuria, trouble voiding, or hematuria Hematological and Lymphatic: negative Breast: negative for breast lumps Musculoskeletal: negative Remaining ROS negative.  Physical Exam: Blood pressure 124/70, pulse 82, temperature 97.5 F (36.4 C), temperature source Oral, resp. rate 18, height 5\' 7"  (1.702  m), weight 184 lb 6.4 oz (83.643 kg). ECOG: 1 General appearance: alert, cooperative and no distress Head: Normocephalic, without obvious abnormality, atraumatic Neck: no adenopathy, no carotid bruit, no JVD, supple, symmetrical, trachea midline and thyroid not enlarged, symmetric, no tenderness/mass/nodules Lymph nodes: Cervical, supraclavicular, and axillary nodes normal. Heart:regular rate and  rhythm, S1, S2 normal, no murmur, click, rub or gallop Lung:chest clear, no wheezing, rales, normal symmetric air entry, no tachypnea, retractions or cyanosis Abdomen: soft, nondistended, normal bowel sounds, no masses palpated and liver edge palpable 2 cm below costal margin and splenomegaly noted EXT:no erythema, induration, or nodules Skin: Spider angiomas noted on the patient's chest.   Lab Results: Lab Results  Component Value Date   WBC 2.9* 04/19/2013   HGB 12.3 04/19/2013   HCT 36.0 04/19/2013   MCV 95.4 04/19/2013   PLT 39* 04/19/2013     Chemistry      Component Value Date/Time   NA 138 04/19/2013 1109   NA 138 04/05/2013 1251   K 3.0* 04/19/2013 1109   K 4.0 04/05/2013 1251   CL 99 04/19/2013 1109   CL 103 04/05/2013 1251   CO2 27 04/19/2013 1109   CO2 30 04/05/2013 1251   BUN 11.0 04/19/2013 1109   BUN 8 04/05/2013 1251   CREATININE 0.8 04/19/2013 1109   CREATININE 0.8 04/05/2013 1251      Component Value Date/Time   CALCIUM 7.5* 04/19/2013 1109   CALCIUM 8.1* 04/05/2013 1251   ALKPHOS 235* 04/19/2013 1109   ALKPHOS 211* 04/05/2013 1251   AST 52* 04/19/2013 1109   AST 57* 04/05/2013 1251   ALT 32 04/19/2013 1109   ALT 35 04/05/2013 1251   BILITOT 2.59* 04/19/2013 1109   BILITOT 1.8* 04/05/2013 1251      Impression and Plan: This is a 47 year old female the following issues: 1. Thrombocytopenia. Patient's platelet count today is 39,000. Review of her records over the past 6 months of this is stable platelet count for her. She has no active bleeding. No platelet transfusion is indicated at this time. I have told the patient and her daughter to watch for any evidence of bleeding and to call us if she has any bleeding. We can recheck a CBC at that time and transfuse platelets as needed. Patient's from the cytopenia is due to her hypersplenism and portal hypertension as well as cirrhosis of the liver. 2. Anemia. Patient's hemoglobin is normal today. No transfusion is indicated. 3. Cirrhosis. The  patient has not been seen by a hepatologist regarding this issue. I have referred her to [facility] to their hepatology department. The patient's total bilirubin today is up to 2.59 with an elevated AST. She is showing stigmata of end-stage liver disease and this needs further evaluation. 4. Hypokalemia. Due to her Lasix usage. The patient states that she has taken potassium supplementation in the past, but has stopped this. I have sent a prescription for K-Dur 20 MEq daily for one month. I have directed her to contact her PCPs office for further management of her hypokalemia. 5. Followup. The patient return in 3 months time with blood counts.   Spent more than half the time coordinating care.    [place], [place] 5/20/20141:46 PM

## 2013-04-20 ENCOUNTER — Telehealth: Payer: Self-pay | Admitting: Oncology

## 2013-04-20 NOTE — Telephone Encounter (Signed)
Called Dr. Wilder Glade  Office @ [facility] to set up a np appt. I faxed pt records to his office.  It will take 2-3 weeks to call pt with appt. Called pt left vm.

## 2013-04-22 ENCOUNTER — Telehealth: Payer: Self-pay | Admitting: Family Medicine

## 2013-04-22 MED ORDER — ALPRAZOLAM 2 MG PO TABS: 2.0000 mg | ORAL_TABLET | Freq: Four times a day (QID) | ORAL | Status: DC | PRN

## 2013-04-22 MED ORDER — OXYCODONE HCL 20 MG PO TABS: 20.0000 mg | ORAL_TABLET | Freq: Three times a day (TID) | ORAL | Status: DC | PRN

## 2013-04-22 NOTE — Telephone Encounter (Signed)
Scripts are ready for pick up and I spoke with pt. 

## 2013-04-22 NOTE — Telephone Encounter (Signed)
Pt would like refill of alprazolam (XANAX) 2 MG tablet AND Oxycodone HCl 20 MG TABS Pt asked if you could have (oxycodone) this ready today so her husband could pick this up when he  gets off work. (4pm) Xanax @ [facility] [place]

## 2013-04-22 NOTE — Telephone Encounter (Signed)
done

## 2013-04-29 ENCOUNTER — Telehealth: Payer: Self-pay | Admitting: Family Medicine

## 2013-04-29 MED ORDER — HYDROCODONE-HOMATROPINE 5-1.5 MG/5ML PO SYRP: 5.0000 mL | ORAL_SOLUTION | ORAL | Status: DC | PRN

## 2013-04-29 MED ORDER — AZITHROMYCIN 250 MG PO TABS: ORAL_TABLET | ORAL | Status: DC

## 2013-04-29 NOTE — Telephone Encounter (Signed)
Per Dr Clent Ridges - Z-pak and Hydromet.  Patient is aware and Rx called into pharmacy

## 2013-04-29 NOTE — Telephone Encounter (Signed)
Patient Information:  Caller Name: Irving Burton  Phone: [phone]  Patient: Angeliki, Mates  Gender: Female  DOB: 08/15/1966  Age: 47 Years  PCP: Gershon Crane [facility])  Pregnant: No  Office Follow Up:  Does the office need to follow up with this patient?: Yes  Instructions For The Office: No appts available with Dr. Clent Ridges in office for See in Office Now krs/can  RN Note:  Seen in office for potassium, hematology issues.  Developed cough 4 days ago, which includes wheezing and shortness of breath during coughing spells.  Per cough protocol, advised office visit now; no appts available with Dr. Clent Ridges.  Info to office/high priority for provider review/appt workin/callback; may reach family at [phone] .  krs/can  Symptoms  Reason For Call & Symptoms: cough  Reviewed Health History In EMR: Yes  Reviewed Medications In EMR: Yes  Reviewed Allergies In EMR: Yes  Reviewed Surgeries / Procedures: Yes  Date of Onset of Symptoms: 04/25/2013 OB / GYN:  LMP: Unknown  Guideline(s) Used:  Cough  Disposition Per Guideline:   Go to Office Now  Reason For Disposition Reached:   Wheezing is present  Advice Given:  N/A  Patient Will Follow Care Advice:  YES

## 2013-05-02 ENCOUNTER — Encounter: Payer: Self-pay | Admitting: Family Medicine

## 2013-05-02 ENCOUNTER — Ambulatory Visit (INDEPENDENT_AMBULATORY_CARE_PROVIDER_SITE_OTHER): Payer: Self-pay | Admitting: Family Medicine

## 2013-05-02 VITALS — BP 138/82 | HR 113 | Temp 98.0°F | Wt 190.0 lb

## 2013-05-02 DIAGNOSIS — J209 Acute bronchitis, unspecified: Secondary | ICD-10-CM

## 2013-05-02 MED ORDER — ALBUTEROL SULFATE HFA 108 (90 BASE) MCG/ACT IN AERS: 2.0000 | INHALATION_SPRAY | RESPIRATORY_TRACT | Status: DC | PRN

## 2013-05-02 MED ORDER — METHYLPREDNISOLONE ACETATE 80 MG/ML IJ SUSP
120.0000 mg | Freq: Once | INTRAMUSCULAR | Status: AC
  Administered 2013-05-02: 120 mg via INTRAMUSCULAR

## 2013-05-02 MED ORDER — HYDROCODONE-HOMATROPINE 5-1.5 MG/5ML PO SYRP: 5.0000 mL | ORAL_SOLUTION | ORAL | Status: DC | PRN

## 2013-05-02 MED ORDER — LEVOFLOXACIN 500 MG PO TABS: 500.0000 mg | ORAL_TABLET | Freq: Every day | ORAL | Status: AC

## 2013-05-02 MED ORDER — IPRATROPIUM-ALBUTEROL 0.5-2.5 (3) MG/3ML IN SOLN
3.0000 mL | Freq: Once | RESPIRATORY_TRACT | Status: AC
  Administered 2013-05-02: 3 mL via RESPIRATORY_TRACT

## 2013-05-02 NOTE — Progress Notes (Signed)
  Subjective:    Patient ID: ARLIE POSCH, female    DOB: 30-Oct-1966, 47 y.o.   MRN: [id number]  HPI Here for 5 days of chest tightness, wheezing, and a dry cough. No fever. She has taken a Zpack with no improvement.    Review of Systems  Constitutional: Negative.   HENT: Negative.   Eyes: Negative.   Respiratory: Positive for cough, chest tightness, shortness of breath and wheezing.   Cardiovascular: Negative.        Objective:   Physical Exam  Constitutional: She appears well-developed and well-nourished.  Mildly SOB, audible wheezing   HENT:  Right Ear: External ear normal.  Left Ear: External ear normal.  Nose: Nose normal.  Mouth/Throat: Oropharynx is clear and moist.  Eyes: Conjunctivae are normal.  Neck: Neck supple.  Pulmonary/Chest: Effort normal. She has no rales.  Scattered rhonchi and wheezes   Lymphadenopathy:    She has no cervical adenopathy.          Assessment & Plan:  Given a nebulizer treatment. Given a steroid shot. Switch to Levaquin. Use inhaler prn.

## 2013-05-10 ENCOUNTER — Telehealth: Payer: Self-pay | Admitting: Oncology

## 2013-05-10 NOTE — Telephone Encounter (Signed)
Faxed pt medical records to Dr. Pablo Lawrence office at [facility]. Dr. Pablo Lawrence office called pt and spoke to the pt mother leaving a message to have pt call for appt.

## 2013-05-20 ENCOUNTER — Telehealth: Payer: Self-pay | Admitting: Family Medicine

## 2013-05-20 NOTE — Telephone Encounter (Signed)
PT husband called and stated that it had been 2 hours since the pt requested to speak with Nettie Elm. I explained to the pt the first time she called this morning, that Nettie Elm was seeing patients and would be unable to call her back immediately. The husband then stated that it was an emergency and I asked him to speak with a traige nurse, he declined. FYI

## 2013-05-20 NOTE — Telephone Encounter (Signed)
PT called and stated that she wanted to speak with you regarding her most recent visit. This is all the details that she would disclose. Please assist.

## 2013-05-20 NOTE — Telephone Encounter (Signed)
I spoke with pt and she is still having some symptoms, sneezing, some wheezing and a little shortness of breath. I spoke with Dr. Clent Ridges and he recommends the pt schedule a follow up appointment if these symptoms does not improve soon. Pt will schedule the follow up within the week if not feeling better.

## 2013-05-23 ENCOUNTER — Telehealth: Payer: Self-pay | Admitting: Family Medicine

## 2013-05-23 MED ORDER — POTASSIUM CHLORIDE CRYS ER 20 MEQ PO TBCR: 20.0000 meq | EXTENDED_RELEASE_TABLET | Freq: Every day | ORAL | Status: DC

## 2013-05-23 MED ORDER — OXYCODONE HCL 20 MG PO TABS: 20.0000 mg | ORAL_TABLET | Freq: Three times a day (TID) | ORAL | Status: DC | PRN

## 2013-05-23 NOTE — Telephone Encounter (Signed)
PT called to request a refill of her Oxycodone HCl 20 MG TABS. PT also requested that Dr. Clent Ridges call in a RX of potassium chloride SA (K-DUR,KLOR-CON) 20 MEQ tablet to [facility] in [place]. She stated that she is having a lot of dental work done tomorrow, and would like to have her pain medication by then. Please assist.

## 2013-05-23 NOTE — Telephone Encounter (Signed)
done

## 2013-05-24 NOTE — Telephone Encounter (Signed)
Script is ready for pick up and I left a voice message for pt. 

## 2013-05-26 ENCOUNTER — Telehealth: Payer: Self-pay | Admitting: Family Medicine

## 2013-05-26 NOTE — Telephone Encounter (Signed)
ED Notification 

## 2013-05-26 NOTE — Telephone Encounter (Signed)
Patient Information:  Caller Name: Brett Canales  Phone: [phone]  Patient: Dana Petersen, Dana Petersen  Gender: Female  DOB: 1966-07-09  Age: 47 Years  PCP: Gershon Crane [facility])  Pregnant: No  Office Follow Up:  Does the office need to follow up with this patient?: No  Instructions For The Office: N/A  RN Note:  Advised husband to take patient to the nearest hospital for immediate evaluation regarding symptoms. Husband reports he will take patient to [facility] ED at this time.  Symptoms  Reason For Call & Symptoms: Wheezing. Reports she had several teeth pulled on 05/25/13. Wheezing has been present several weeks. Productive cough with clear mucus also present. Reports wheezing can be heard across the room at this time. Patient reports shortness of breath at times.  Reviewed Health History In EMR: Yes  Reviewed Medications In EMR: Yes  Reviewed Allergies In EMR: Yes  Reviewed Surgeries / Procedures: Yes  Date of Onset of Symptoms: 05/12/2013  Treatments Tried: Albuterol inhaler; nebulizer with Albuterol  Treatments Tried Worked: No OB / GYN:  LMP: 05/26/2013  Guideline(s) Used:  Breathing Difficulty  Disposition Per Guideline:   Go to ED Now  Reason For Disposition Reached:   Wheezing can be heard across the room  Advice Given:  N/A  Patient Will Follow Care Advice:  YES

## 2013-05-26 NOTE — Telephone Encounter (Signed)
[name] husband  would like you to call him concerning pt. She had 20 teeth pulled and still has the upper respiratory. Pt would like something else called in for this upper respiratory infection and wheezing. Transferred pt to triage nurse.Marland Kitchen Pharm: [facility] [address]

## 2013-05-27 NOTE — Telephone Encounter (Signed)
Her husband ended up taking her to the ER

## 2013-06-05 ENCOUNTER — Other Ambulatory Visit: Payer: Self-pay | Admitting: Family Medicine

## 2013-06-06 ENCOUNTER — Ambulatory Visit: Payer: Self-pay | Admitting: Family Medicine

## 2013-06-08 ENCOUNTER — Telehealth: Payer: Self-pay | Admitting: Family Medicine

## 2013-06-08 NOTE — Telephone Encounter (Signed)
Pt was in hospital and was put on advair 250/50. Pt would like samples

## 2013-06-08 NOTE — Telephone Encounter (Signed)
Sample is ready for pick up and I did speak with pt. She wants to know if there is another medication in a pill form that can replace the Advair. It is too expensive and she cannot afford it.

## 2013-06-13 ENCOUNTER — Telehealth: Payer: Self-pay | Admitting: Family Medicine

## 2013-06-13 NOTE — Telephone Encounter (Signed)
Is this okay for pt to wait until then to come in for follow up?

## 2013-06-13 NOTE — Telephone Encounter (Signed)
Pt scheduled for next Friday 06/24/13 for a follow up.

## 2013-06-13 NOTE — Telephone Encounter (Signed)
Pt thought her appt was today, but it was last Monday. Pt would like to know if Dr Dana Petersen will see her Friday? This is a hospital fup? Pls advise.

## 2013-06-13 NOTE — Telephone Encounter (Signed)
appt made for 7/25/kh

## 2013-06-17 NOTE — Telephone Encounter (Signed)
Yes it is okay to see [place] on 06-24-13

## 2013-06-17 NOTE — Telephone Encounter (Signed)
No there is no pill substitute for Advair. She can use samples if we have them.

## 2013-06-17 NOTE — Telephone Encounter (Signed)
I left voice message with below information. Pt has office visit on 06/24/13, she can discuss this further.

## 2013-06-24 ENCOUNTER — Ambulatory Visit (INDEPENDENT_AMBULATORY_CARE_PROVIDER_SITE_OTHER): Payer: Self-pay | Admitting: Family Medicine

## 2013-06-24 ENCOUNTER — Encounter: Payer: Self-pay | Admitting: Family Medicine

## 2013-06-24 VITALS — BP 130/76 | HR 90 | Temp 98.1°F | Wt 184.0 lb

## 2013-06-24 DIAGNOSIS — J4541 Moderate persistent asthma with (acute) exacerbation: Secondary | ICD-10-CM

## 2013-06-24 DIAGNOSIS — J45901 Unspecified asthma with (acute) exacerbation: Secondary | ICD-10-CM

## 2013-06-24 DIAGNOSIS — J189 Pneumonia, unspecified organism: Secondary | ICD-10-CM

## 2013-06-24 MED ORDER — CLARITHROMYCIN 500 MG PO TABS: 500.0000 mg | ORAL_TABLET | Freq: Two times a day (BID) | ORAL | Status: DC

## 2013-06-24 MED ORDER — MOMETASONE FURO-FORMOTEROL FUM 200-5 MCG/ACT IN AERO: 2.0000 | INHALATION_SPRAY | Freq: Two times a day (BID) | RESPIRATORY_TRACT | Status: DC

## 2013-06-24 MED ORDER — OXYCODONE HCL 20 MG PO TABS: 20.0000 mg | ORAL_TABLET | Freq: Three times a day (TID) | ORAL | Status: DC | PRN

## 2013-06-24 MED ORDER — FLUOXETINE HCL 40 MG PO CAPS: 40.0000 mg | ORAL_CAPSULE | Freq: Two times a day (BID) | ORAL | Status: DC

## 2013-06-24 MED ORDER — FUROSEMIDE 40 MG PO TABS: 40.0000 mg | ORAL_TABLET | Freq: Two times a day (BID) | ORAL | Status: DC

## 2013-06-24 NOTE — Progress Notes (Signed)
  Subjective:    Patient ID: Dana Petersen, female    DOB: [birth year], 47 y.o.   MRN: [id number]  HPI Here to follow up a stay at [facility] from 05-26-13 to 06-05-13 for pneumonia. On 05-03-13 she had 20 teeth extracted and she started to feel badly the next day with chest congestion, SOB, and coughing up green sputum. No chest pain or fever. She was diagnosed with pneumonia and received IV fluids and antibiotics. She was sent home on 7 days of Levaquin and a rx for Advair. She took the Levaquin but did not fill the Advair because it was too expensive. Today she feels no better than she did when she went home from the hospital.    Review of Systems  Constitutional: Positive for fatigue. Negative for fever.  HENT: Negative.   Eyes: Negative.   Respiratory: Positive for cough, chest tightness, shortness of breath and wheezing.   Cardiovascular: Negative.        Objective:   Physical Exam  Constitutional: She appears well-developed and well-nourished. No distress.  Neck: No thyromegaly present.  Cardiovascular: Normal rate, regular rhythm, normal heart sounds and intact distal pulses.   Pulmonary/Chest: Effort normal. No respiratory distress. She has no rales.  Scattered wheezes and rhonchi   Lymphadenopathy:    She has no cervical adenopathy.          Assessment & Plan:  Partially treated pneumonia with an asthma flare. Given Biaxin and samples of Dulera. Recheck prn. She will be fitted for dentures in 2 weeks

## 2013-07-18 ENCOUNTER — Telehealth: Payer: Self-pay | Admitting: Family Medicine

## 2013-07-18 DIAGNOSIS — R5381 Other malaise: Secondary | ICD-10-CM

## 2013-07-18 NOTE — Telephone Encounter (Signed)
No Ritalin is not appropriate and would not help

## 2013-07-18 NOTE — Telephone Encounter (Signed)
Patient is going to have a lot of dental work and feels as though she has no energy and wondering if ritalin would help?

## 2013-07-18 NOTE — Telephone Encounter (Signed)
Pt would like you to call her. She would like to ask you if Dr Clent Ridges will prescribe her a certain med for her.

## 2013-07-20 NOTE — Telephone Encounter (Signed)
Per Dr Clent Ridges we could check b12 level to see if its low and causing fatigue.  Pt will see if she can find a ride and call back to schedule lab appt

## 2013-07-20 NOTE — Telephone Encounter (Signed)
Left message for pt to call back  °

## 2013-07-20 NOTE — Telephone Encounter (Signed)
Pt following up on request for ritalin. Pls advise.

## 2013-07-20 NOTE — Telephone Encounter (Signed)
Pt aware.

## 2013-07-21 ENCOUNTER — Other Ambulatory Visit: Payer: Self-pay | Admitting: Lab

## 2013-07-21 ENCOUNTER — Ambulatory Visit: Payer: Self-pay | Admitting: Oncology

## 2013-07-21 NOTE — Telephone Encounter (Signed)
Left message on machine for patient

## 2013-07-25 ENCOUNTER — Other Ambulatory Visit: Payer: Self-pay | Admitting: Family Medicine

## 2013-07-25 NOTE — Telephone Encounter (Signed)
Last OV 06/24/13. Last filled 04/22/13 for # 360 with 1 rf

## 2013-07-25 NOTE — Telephone Encounter (Signed)
Pt needs new rx oxycodone 20 mg and refill alprazolam [facility] [place]

## 2013-07-26 MED ORDER — ALPRAZOLAM 2 MG PO TABS: 2.0000 mg | ORAL_TABLET | Freq: Four times a day (QID) | ORAL | Status: DC | PRN

## 2013-07-26 MED ORDER — OXYCODONE HCL 20 MG PO TABS: 20.0000 mg | ORAL_TABLET | Freq: Three times a day (TID) | ORAL | Status: DC | PRN

## 2013-07-26 NOTE — Telephone Encounter (Signed)
The oxycodone is done but she already has Xanax refills to last until November

## 2013-08-22 ENCOUNTER — Telehealth: Payer: Self-pay | Admitting: Family Medicine

## 2013-08-22 MED ORDER — OXYCODONE HCL 20 MG PO TABS: 20.0000 mg | ORAL_TABLET | Freq: Three times a day (TID) | ORAL | Status: DC | PRN

## 2013-08-22 NOTE — Telephone Encounter (Signed)
Script is ready for pick up and I spoke with pt.  
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
## 2013-08-22 NOTE — Telephone Encounter (Signed)
done

## 2013-08-28 ENCOUNTER — Other Ambulatory Visit: Payer: Self-pay | Admitting: Family Medicine

## 2013-09-06 ENCOUNTER — Telehealth: Payer: Self-pay | Admitting: Family Medicine

## 2013-09-06 NOTE — Telephone Encounter (Signed)
Pt needs new rx oxycodone. Pt needs refill on clonidine 0.1mg  [facility] [phone]

## 2013-09-07 NOTE — Telephone Encounter (Signed)
Call in Clonidine #180 with 3 rf. No refills on Oxycodone until 09-21-13

## 2013-09-08 MED ORDER — CLONIDINE HCL 0.1 MG PO TABS: 0.1000 mg | ORAL_TABLET | Freq: Two times a day (BID) | ORAL | Status: DC

## 2013-09-08 NOTE — Telephone Encounter (Signed)
Pt would like the [facility]idine [facility] to [facility] in [place]. Thank you!

## 2013-09-08 NOTE — Telephone Encounter (Signed)
I sent script for Clonidine e-scribe and pt is aware that it is too early for the Oxycodone.

## 2013-09-19 ENCOUNTER — Telehealth: Payer: Self-pay | Admitting: Family Medicine

## 2013-09-19 NOTE — Telephone Encounter (Signed)
Pt would like more samples of dulera

## 2013-09-19 NOTE — Telephone Encounter (Signed)
Pt needs new rx oxycodone and refill on alprazolam [facility] in [place] [phone]

## 2013-09-20 MED ORDER — OXYCODONE HCL 20 MG PO TABS
20.0000 mg | ORAL_TABLET | Freq: Three times a day (TID) | ORAL | Status: DC | PRN
Start: 2013-09-20 — End: 2013-10-17

## 2013-09-20 MED ORDER — ALPRAZOLAM 2 MG PO TABS: 2.0000 mg | ORAL_TABLET | Freq: Four times a day (QID) | ORAL | Status: DC

## 2013-09-20 NOTE — Telephone Encounter (Signed)
rx were printed out. No samples of Dulera available

## 2013-09-20 NOTE — Telephone Encounter (Signed)
Scripts are ready for pick up and I spoke with pt. 

## 2013-10-02 ENCOUNTER — Other Ambulatory Visit: Payer: Self-pay | Admitting: Family Medicine

## 2013-10-17 ENCOUNTER — Telehealth: Payer: Self-pay | Admitting: Family Medicine

## 2013-10-17 MED ORDER — OXYCODONE HCL 20 MG PO TABS: 20.0000 mg | ORAL_TABLET | Freq: Three times a day (TID) | ORAL | Status: DC | PRN

## 2013-10-17 MED ORDER — ALPRAZOLAM 2 MG PO TABS: 2.0000 mg | ORAL_TABLET | Freq: Four times a day (QID) | ORAL | Status: DC

## 2013-10-17 NOTE — Telephone Encounter (Signed)
Pt request refill of Oxycodone HCl 20 MG TABS

## 2013-10-17 NOTE — Addendum Note (Signed)
Addended by: Gershon Crane A on: 10/17/2013 03:07 PM   Modules accepted: Orders

## 2013-10-17 NOTE — Telephone Encounter (Signed)
done

## 2013-10-17 NOTE — Telephone Encounter (Signed)
Done but before she picks these up please call her current pharmacy to cancel all remaining refills

## 2013-10-17 NOTE — Telephone Encounter (Signed)
Pt would like to switch pharm for her alprazolam Prudy Feeler) 2 MG tablet. Pt states the pharm will not switch for her and she would like to know if she could p/u a hard copy for this med when she picks up her rx for oxycodone 20. Pt knows she has 5 refills on this med, but she doesn't have insurnace and needs to go where it is cheaper.

## 2013-10-18 NOTE — Telephone Encounter (Signed)
I did call [facility] and cancelled the refills and I spoke with pt. The new script is ready for pick up.

## 2013-10-18 NOTE — Telephone Encounter (Signed)
Script is ready for pick up and I left a voice message.  

## 2013-10-18 NOTE — Telephone Encounter (Signed)
I left voice message for pt. I need to know what pharmacy has the refills, so that I can call and cancel. Pt can pick up a new script once the refills are cancelled. I called 3 pharmacies in pt's chart and script was not at any of them.

## 2013-10-18 NOTE — Telephone Encounter (Signed)
[facility] [address] for those leftover refills! Pt would also like to know when she can p/u those rx's?

## 2013-10-26 ENCOUNTER — Other Ambulatory Visit: Payer: Self-pay | Admitting: Family Medicine

## 2013-10-29 ENCOUNTER — Other Ambulatory Visit: Payer: Self-pay | Admitting: Family Medicine

## 2013-10-31 ENCOUNTER — Telehealth: Payer: Self-pay | Admitting: Family Medicine

## 2013-10-31 NOTE — Telephone Encounter (Signed)
Husband thought rx was at [facility]. Informed him it was [facility]

## 2013-10-31 NOTE — Telephone Encounter (Signed)
We refilled Clonidine #60 in October and she is not due Oxycodone until 11-16-13

## 2013-10-31 NOTE — Telephone Encounter (Signed)
Pt is out °

## 2013-10-31 NOTE — Telephone Encounter (Addendum)
Pt need new rx on clonidine #90 sent to [facility] in [place] on [address]. Pt needs new rx percocet

## 2013-11-14 ENCOUNTER — Telehealth: Payer: Self-pay | Admitting: Family Medicine

## 2013-11-14 NOTE — Telephone Encounter (Addendum)
Pt needs new rx oxycodone. Pt also needs another sample of dulera

## 2013-11-15 MED ORDER — OXYCODONE HCL 20 MG PO TABS: 20.0000 mg | ORAL_TABLET | Freq: Three times a day (TID) | ORAL | Status: DC | PRN

## 2013-11-15 NOTE — Telephone Encounter (Signed)
Done but she needs to be tested and sign a contract before picking this up

## 2013-11-16 NOTE — Telephone Encounter (Signed)
Pt is aware.  

## 2013-11-21 ENCOUNTER — Other Ambulatory Visit: Payer: Self-pay | Admitting: Oncology

## 2013-11-22 ENCOUNTER — Other Ambulatory Visit: Payer: Self-pay | Admitting: Oncology

## 2013-12-05 ENCOUNTER — Encounter: Payer: Self-pay | Admitting: Family Medicine

## 2013-12-12 ENCOUNTER — Telehealth: Payer: Self-pay | Admitting: Family Medicine

## 2013-12-12 NOTE — Telephone Encounter (Signed)
Pt requesting refill of Oxycodone HCl 20 MG TABS.  Also, requesting samples of Dulera 200/5, if available.

## 2013-12-13 MED ORDER — OXYCODONE HCL 20 MG PO TABS: 20.0000 mg | ORAL_TABLET | Freq: Three times a day (TID) | ORAL | Status: DC | PRN

## 2013-12-13 NOTE — Telephone Encounter (Signed)
done

## 2013-12-14 NOTE — Telephone Encounter (Signed)
Script is ready for pick up, we did have one box of Dulera and I left a voice message for pt.

## 2013-12-14 NOTE — Telephone Encounter (Signed)
Pt returned call, I notified her script and sample was available for pick up.

## 2014-01-10 ENCOUNTER — Ambulatory Visit (INDEPENDENT_AMBULATORY_CARE_PROVIDER_SITE_OTHER): Payer: Self-pay | Admitting: Family Medicine

## 2014-01-10 ENCOUNTER — Encounter: Payer: Self-pay | Admitting: Family Medicine

## 2014-01-10 VITALS — BP 138/80 | HR 98 | Temp 99.0°F | Ht 67.0 in | Wt 195.0 lb

## 2014-01-10 DIAGNOSIS — R600 Localized edema: Secondary | ICD-10-CM

## 2014-01-10 DIAGNOSIS — L03119 Cellulitis of unspecified part of limb: Secondary | ICD-10-CM

## 2014-01-10 DIAGNOSIS — L02619 Cutaneous abscess of unspecified foot: Secondary | ICD-10-CM

## 2014-01-10 DIAGNOSIS — L03116 Cellulitis of left lower limb: Secondary | ICD-10-CM | POA: Insufficient documentation

## 2014-01-10 DIAGNOSIS — R609 Edema, unspecified: Secondary | ICD-10-CM

## 2014-01-10 DIAGNOSIS — K7460 Unspecified cirrhosis of liver: Secondary | ICD-10-CM

## 2014-01-10 MED ORDER — KETOROLAC TROMETHAMINE 60 MG/2ML IM SOLN
60.0000 mg | Freq: Once | INTRAMUSCULAR | Status: AC
  Administered 2014-01-10: 60 mg via INTRAMUSCULAR

## 2014-01-10 MED ORDER — CLINDAMYCIN HCL 300 MG PO CAPS: 300.0000 mg | ORAL_CAPSULE | Freq: Four times a day (QID) | ORAL | Status: DC

## 2014-01-10 MED ORDER — FUROSEMIDE 40 MG PO TABS: 80.0000 mg | ORAL_TABLET | Freq: Two times a day (BID) | ORAL | Status: DC

## 2014-01-10 MED ORDER — OXYCODONE HCL 20 MG PO TABS: 20.0000 mg | ORAL_TABLET | Freq: Three times a day (TID) | ORAL | Status: DC | PRN

## 2014-01-10 MED ORDER — CEFTRIAXONE SODIUM 1 G IJ SOLR
1.0000 g | Freq: Once | INTRAMUSCULAR | Status: AC
  Administered 2014-01-10: 1 g via INTRAMUSCULAR

## 2014-01-10 NOTE — Addendum Note (Signed)
Addended by: Aniceto BossNIMMONS, Adelia Baptista A on: 01/10/2014 02:35 PM   Modules accepted: Orders

## 2014-01-10 NOTE — Progress Notes (Signed)
Pre visit review using our clinic review tool, if applicable. No additional management support is needed unless otherwise documented below in the visit note. 

## 2014-01-10 NOTE — Progress Notes (Signed)
   Subjective:    Patient ID: Dana Petersen, female    DOB: [birth year], 48 y.o.   MRN: [id number]  HPI Here with her husband to follow up a hospital stay from 12-26-13 to 12-29-13 at [facility] for cellulitis of the left foot. This had resulted from chronic edema to both feet and lower legs. She had some acute renal failure which resolved with IVF. Her DC creatinine had come back down to 1.1. She was given IV Zosyn and this was changed to oral Keflex prior to her DC. Her left foot seemed to improve for awhile but she has been off antibiotics for a week now, and for the past 3 days the foot is getting red and very painful again. No fever. She keeps it elevated as much as possible.    Review of Systems  Constitutional: Negative.   Respiratory: Negative.   Cardiovascular: Positive for leg swelling. Negative for chest pain and palpitations.  Gastrointestinal: Positive for abdominal distention. Negative for abdominal pain.       Objective:   Physical Exam  Constitutional: She appears well-developed and well-nourished.  Cardiovascular: Normal rate, regular rhythm, normal heart sounds and intact distal pulses.   Pulmonary/Chest: Effort normal and breath sounds normal.  Musculoskeletal:  4+ edema to both lower legs and feet.   Skin:  The dorsal left foot has an eschar in place surrounded by a zone of erythema. This is warm and very tender          Assessment & Plan:  Partially treated cellulitis due to chronic leg edema. Increase Lasix to 80 mg bid. Given a shot of Rocephin to be followed by 10 days of Clindamycin. Recheck prn

## 2014-01-11 ENCOUNTER — Telehealth: Payer: Self-pay | Admitting: Family Medicine

## 2014-01-11 NOTE — Telephone Encounter (Signed)
Relevant patient education mailed to patient.  

## 2014-02-02 ENCOUNTER — Telehealth: Payer: Self-pay | Admitting: Family Medicine

## 2014-02-02 NOTE — Telephone Encounter (Signed)
Daughter called to advise us that pt has had still had swelling. The 4 pills /day were not working very well. Pt accidently took an extra lasix, and the next day was so much better. The swelling has gone done immensely. Pt would like to know if its ok to increase her lasix.

## 2014-02-02 NOTE — Telephone Encounter (Signed)
Tell her to take 3 Lasix pills each morning and 2 each evening for one week, then go back to 2 twice a day

## 2014-02-02 NOTE — Telephone Encounter (Signed)
I left a voice message with below information. 

## 2014-02-06 ENCOUNTER — Telehealth: Payer: Self-pay | Admitting: Family Medicine

## 2014-02-06 MED ORDER — OXYCODONE HCL 20 MG PO TABS: 20.0000 mg | ORAL_TABLET | Freq: Three times a day (TID) | ORAL | Status: DC | PRN

## 2014-02-06 NOTE — Telephone Encounter (Signed)
Done for one month  ?
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
## 2014-02-06 NOTE — Telephone Encounter (Signed)
Pt called to f/u on refill request

## 2014-02-07 ENCOUNTER — Emergency Department (HOSPITAL_COMMUNITY)
Admission: EM | Admit: 2014-02-07 | Discharge: 2014-02-08 | Disposition: A | Discharge status: Home / Self Care | Payer: Medicaid Other | Attending: Emergency Medicine | Admitting: Emergency Medicine

## 2014-02-07 ENCOUNTER — Encounter (HOSPITAL_COMMUNITY): Payer: Self-pay | Admitting: Emergency Medicine

## 2014-02-07 DIAGNOSIS — Z862 Personal history of diseases of the blood and blood-forming organs and certain disorders involving the immune mechanism: Secondary | ICD-10-CM | POA: Insufficient documentation

## 2014-02-07 DIAGNOSIS — I10 Essential (primary) hypertension: Secondary | ICD-10-CM | POA: Diagnosis not present

## 2014-02-07 DIAGNOSIS — Z8719 Personal history of other diseases of the digestive system: Secondary | ICD-10-CM | POA: Insufficient documentation

## 2014-02-07 DIAGNOSIS — Z8709 Personal history of other diseases of the respiratory system: Secondary | ICD-10-CM | POA: Diagnosis not present

## 2014-02-07 DIAGNOSIS — Z79899 Other long term (current) drug therapy: Secondary | ICD-10-CM | POA: Diagnosis not present

## 2014-02-07 DIAGNOSIS — IMO0002 Reserved for concepts with insufficient information to code with codable children: Secondary | ICD-10-CM | POA: Diagnosis not present

## 2014-02-07 DIAGNOSIS — G40309 Generalized idiopathic epilepsy and epileptic syndromes, not intractable, without status epilepticus: Secondary | ICD-10-CM | POA: Diagnosis not present

## 2014-02-07 DIAGNOSIS — R609 Edema, unspecified: Secondary | ICD-10-CM | POA: Diagnosis not present

## 2014-02-07 DIAGNOSIS — F331 Major depressive disorder, recurrent, moderate: Secondary | ICD-10-CM | POA: Insufficient documentation

## 2014-02-07 DIAGNOSIS — F17200 Nicotine dependence, unspecified, uncomplicated: Secondary | ICD-10-CM | POA: Diagnosis not present

## 2014-02-07 DIAGNOSIS — Z5189 Encounter for other specified aftercare: Secondary | ICD-10-CM

## 2014-02-07 DIAGNOSIS — E119 Type 2 diabetes mellitus without complications: Secondary | ICD-10-CM | POA: Diagnosis not present

## 2014-02-07 DIAGNOSIS — M79609 Pain in unspecified limb: Secondary | ICD-10-CM | POA: Diagnosis present

## 2014-02-07 MED ORDER — OXYCODONE HCL 5 MG PO TABS
20.0000 mg | ORAL_TABLET | Freq: Once | ORAL | Status: AC
Start: 2014-02-07 — End: 2014-02-07
  Administered 2014-02-07: 20 mg via ORAL
  Filled 2014-02-07: qty 4

## 2014-02-07 NOTE — Telephone Encounter (Signed)
Script is ready for pick up and I spoke with pt.  

## 2014-02-07 NOTE — ED Notes (Signed)
Pt reporting wound on top of left foot for approximately 3 weeks. Bilateral edema to legs and feet.  Pt reports that she did see her PCP yesterday and her lasix with increased.  Pt was also prescribed Oxycodone for pain, but states that she hasn't been able to get script filled.

## 2014-02-07 NOTE — ED Notes (Signed)
Pt c/o sore on her let foot for 3 wks.

## 2014-02-07 NOTE — ED Notes (Signed)
Assisted pt to bathroom w/ standby assist.

## 2014-02-08 LAB — CBG MONITORING, ED: Glucose-Capillary: 106 mg/dL — ABNORMAL HIGH (ref 70–99)

## 2014-02-08 NOTE — ED Provider Notes (Signed)
Medical screening examination/treatment/procedure(s) were conducted as a shared visit with non-physician practitioner(s) and myself.  I personally evaluated the patient during the encounter.   EKG Interpretation None      Well appearing. Anasarca. No signs of infection. Normal PT and DP pulses distally. Recommended elevation and compression stockings. Neosporin to wound on left foot. No signs of infection at this time. Doubt deep space infection  Lyanne CoKevin M Carliss Porcaro, MD 02/08/14 [phone]

## 2014-02-08 NOTE — Discharge Instructions (Signed)
Peripheral Edema You have swelling in your legs (peripheral edema). This swelling is due to excess accumulation of salt and water in your body. Edema may be a sign of heart, kidney or liver disease, or a side effect of a medication. It may also be due to problems in the leg veins. Elevating your legs and using special support stockings may be very helpful, if the cause of the swelling is due to poor venous circulation. Avoid long periods of standing, whatever the cause. Treatment of edema depends on identifying the cause. Chips, pretzels, pickles and other salty foods should be avoided. Restricting salt in your diet is almost always needed. Water pills (diuretics) are often used to remove the excess salt and water from your body via urine. These medicines prevent the kidney from reabsorbing sodium. This increases urine flow. Diuretic treatment may also result in lowering of potassium levels in your body. Potassium supplements may be needed if you have to use diuretics daily. Daily weights can help you keep track of your progress in clearing your edema. You should call your caregiver for follow up care as recommended. SEEK IMMEDIATE MEDICAL CARE IF:   You have increased swelling, pain, redness, or heat in your legs.  You develop shortness of breath, especially when lying down.  You develop chest or abdominal pain, weakness, or fainting.  You have a fever. Document Released: 12/25/2004 Document Revised: 02/09/2012 Document Reviewed: 12/05/2009 [facility] Patient Information 2014 [place].   You have no sign of infection in your foot tonight.  Elevate your legs and increase your Lasix as instructed by Dr Clent RidgesFry.  Get your pain medicine filled tomorrow and follow up with Dr Clent RidgesFry as needed.  You may want to apply an antibiotic ointment to the scab on your foot twice daily to help keep the scab soft.
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
## 2014-02-09 ENCOUNTER — Telehealth: Payer: Self-pay | Admitting: Family Medicine

## 2014-02-09 NOTE — Telephone Encounter (Signed)
Pt needs a list of all meds, and amounts she is on.  Pt picked up a copy yesterday. But has now lost it. Pt is having to manage her meds now and hasn't done this before.  [name] parents want to pick this up this morning  While they are in town. Pt will not be with them.

## 2014-02-09 NOTE — Telephone Encounter (Signed)
I printed 2 copies of this, ready for pick up.

## 2014-03-06 ENCOUNTER — Telehealth: Payer: Self-pay | Admitting: Family Medicine

## 2014-03-06 MED ORDER — OXYCODONE HCL 20 MG PO TABS: 20.0000 mg | ORAL_TABLET | Freq: Three times a day (TID) | ORAL | Status: DC | PRN

## 2014-03-06 NOTE — Telephone Encounter (Signed)
Pt request refill Oxycodone HCl 20 MG TABS Pt states she is out and needs asap. Pt aware dr fry is out of office. Would still like to get if possible.

## 2014-03-06 NOTE — Telephone Encounter (Signed)
done

## 2014-03-13 ENCOUNTER — Telehealth: Payer: Self-pay | Admitting: Family Medicine

## 2014-03-13 NOTE — Telephone Encounter (Signed)
Pt said she has very bad cough and wheezing and would like to know if Dr Clent RidgesFry would call her in something to [facility] [place]

## 2014-03-15 NOTE — Telephone Encounter (Signed)
She needs an OV 

## 2014-03-15 NOTE — Telephone Encounter (Signed)
Can you offer appointment to pt?

## 2014-03-16 NOTE — Telephone Encounter (Signed)
lmovm to call back.

## 2014-03-17 MED ORDER — AZITHROMYCIN 250 MG PO TABS: ORAL_TABLET | ORAL | Status: DC
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
## 2014-03-17 NOTE — Telephone Encounter (Signed)
Looks like a FYI 

## 2014-03-17 NOTE — Telephone Encounter (Signed)
Spoke with pt I told her we could see her today but she said she don't have a way // transfer her to triage //

## 2014-03-17 NOTE — Telephone Encounter (Signed)
Call in a Zpack  ?

## 2014-03-17 NOTE — Telephone Encounter (Signed)
I sent script e-scribe and spoke with pt. 

## 2014-03-23 ENCOUNTER — Telehealth: Payer: Self-pay | Admitting: Family Medicine

## 2014-03-23 NOTE — Telephone Encounter (Signed)
Pt was given a card to get one free dulera . But pt needs a rx to get this. Pt was prescribed this by dr fry. Pt states she is having trouble breathing at night and some in the day. This helps. Can you call [facility] in? [facility]

## 2014-03-23 NOTE — Telephone Encounter (Signed)
Pt would like to know if we have any mometasone-formoterol (DULERA) 200-5 MCG/ACT AERO Samples. Pt states this med is $600. pls advise. Pt would like a cb either way.

## 2014-03-27 MED ORDER — MOMETASONE FURO-FORMOTEROL FUM 200-5 MCG/ACT IN AERO: 2.0000 | INHALATION_SPRAY | Freq: Two times a day (BID) | RESPIRATORY_TRACT | Status: DC

## 2014-03-27 NOTE — Telephone Encounter (Signed)
I sent script e-scribe to pharmacy.

## 2014-03-30 ENCOUNTER — Telehealth: Payer: Self-pay | Admitting: Family Medicine

## 2014-03-30 NOTE — Telephone Encounter (Signed)
Patient Information:  Caller Name: Efraim KaufmannMelissa  Phone: [phone]  Patient: Dana Petersen, Dana Petersen  Gender: Female  DOB: 26-Nov-1966  Age: 48 Years  PCP: Gershon CraneFry, [facility])  Pregnant: No  Office Follow Up:  Does the office need to follow up with this patient?: No  Instructions For The Office: N/A  RN Note:  Ambulatory; legs are "painful from tightness" when walks. Advised to See MD today for moderate edema per Leg Edema.  Requests appointment for 03/31/14 since has no transportation 03/30/14.  Dr Clent RidgesFry only has same day appointments for 03/31/14.  Contacted Norma/office scheduler who scheduled for 1030 03/31/14 with Dr Clent RidgesFry per caller request.   Symptoms  Reason For Call & Symptoms: Edema of feets and legs to knee. Bilateral leg pain is severe.  Legs are tight but not weeping. Husband left after 30 years of marriage on 03/26/14.Random blood sugar  125  2200 03/29/14.  Reviewed Health History In EMR: Yes  Reviewed Medications In EMR: Yes  Reviewed Allergies In EMR: Yes  Reviewed Surgeries / Procedures: Yes  Date of Onset of Symptoms: 03/27/2014  Treatments Tried: Elevate legs, stay off them, Lasix 40 mg BID  Treatments Tried Worked: No OB / GYN:  LMP: 02/25/2014  Guideline(s) Used:  Leg Swelling and Edema  Disposition Per Guideline:   See Today in Office  Reason For Disposition Reached:   Moderate swelling of both ankles (e.g., swelling extends up to the knees) AND new onset or worsening  Advice Given:  Call Back If:  Swelling becomes worse  Swelling becomes red or painful to the touch  Calf pain occurs and becomes constant  You become worse.  RN Overrode Recommendation:  Make Appointment  Requests appointment for 03/31/14 due to no transportation today, 03/30/14.

## 2014-03-30 NOTE — Telephone Encounter (Signed)
Pt needs new rx alprazolam [facility] [place] [phone]

## 2014-03-31 ENCOUNTER — Ambulatory Visit (INDEPENDENT_AMBULATORY_CARE_PROVIDER_SITE_OTHER): Payer: Self-pay | Admitting: Family Medicine

## 2014-03-31 ENCOUNTER — Encounter: Payer: Self-pay | Admitting: Family Medicine

## 2014-03-31 VITALS — BP 109/65 | HR 86 | Temp 98.8°F | Ht 64.0 in | Wt 198.0 lb

## 2014-03-31 DIAGNOSIS — M545 Low back pain, unspecified: Secondary | ICD-10-CM

## 2014-03-31 DIAGNOSIS — R609 Edema, unspecified: Secondary | ICD-10-CM

## 2014-03-31 DIAGNOSIS — R600 Localized edema: Secondary | ICD-10-CM

## 2014-03-31 DIAGNOSIS — I10 Essential (primary) hypertension: Secondary | ICD-10-CM

## 2014-03-31 DIAGNOSIS — F411 Generalized anxiety disorder: Secondary | ICD-10-CM

## 2014-03-31 DIAGNOSIS — F419 Anxiety disorder, unspecified: Secondary | ICD-10-CM

## 2014-03-31 MED ORDER — FUROSEMIDE 40 MG PO TABS: 80.0000 mg | ORAL_TABLET | Freq: Two times a day (BID) | ORAL | Status: DC

## 2014-03-31 MED ORDER — ALPRAZOLAM 2 MG PO TABS: 2.0000 mg | ORAL_TABLET | Freq: Four times a day (QID) | ORAL | Status: DC

## 2014-03-31 MED ORDER — OXYCODONE HCL 20 MG PO TABS: 20.0000 mg | ORAL_TABLET | Freq: Four times a day (QID) | ORAL | Status: DC | PRN

## 2014-03-31 NOTE — Progress Notes (Signed)
Pre visit review using our clinic review tool, if applicable. No additional management support is needed unless otherwise documented below in the visit note. 

## 2014-03-31 NOTE — Telephone Encounter (Signed)
We did this today at the [facility]

## 2014-03-31 NOTE — Progress Notes (Signed)
   Subjective:    Patient ID: Dana Petersen, female    DOB: 12-Nov-1966, 48 y.o.   MRN: [id number]  HPI Here to follow up lower extremity edema. This is secondary to her cirrhosis and does not respond well to Lasix. No SOB. She is in constant pain due to the leg swelling. She tries to keep them elevated but this is difficult. She is very stressed out due to the fact that her husband recently kicked her out of the home and she is now living with her parents. She has reapplied for Medicaid and is waiting to hear back on this.    Review of Systems  Constitutional: Negative.   Respiratory: Negative.   Cardiovascular: Positive for leg swelling. Negative for chest pain and palpitations.       Objective:   Physical Exam  Constitutional: She appears well-developed and well-nourished.  Cardiovascular: Normal rate, regular rhythm and normal heart sounds.   Pulmonary/Chest: Effort normal and breath sounds normal.  Musculoskeletal:  4+ edema in both legs up to the hips   Psychiatric:  Crying           Assessment & Plan:  Refilled Xanax and pain meds. She can try compression stockings. Hopefully her Medicaid will come through soon so she can have better access to care

## 2014-03-31 NOTE — Telephone Encounter (Signed)
Pt is on the schedule for today. 

## 2014-04-03 ENCOUNTER — Telehealth: Payer: Self-pay | Admitting: Family Medicine

## 2014-04-03 NOTE — Telephone Encounter (Signed)
Relevant patient education mailed to patient.  

## 2014-04-10 ENCOUNTER — Telehealth: Payer: Self-pay | Admitting: Family Medicine

## 2014-04-10 DIAGNOSIS — R0602 Shortness of breath: Secondary | ICD-10-CM

## 2014-04-10 DIAGNOSIS — R609 Edema, unspecified: Secondary | ICD-10-CM

## 2014-04-10 NOTE — Telephone Encounter (Signed)
Pt is requesting to speak directly with nurse regarding dr. Clent RidgesFry stating she needs to see a specialist, pt states she wants to talk with sylvia regarding her insurance.

## 2014-04-12 NOTE — Telephone Encounter (Signed)
I referred her to Cardiology  

## 2014-04-12 NOTE — Telephone Encounter (Signed)
I spoke with Dana Petersen and her feet and ankles are still swelling. She has been elevating them and it does help some. She wanted to know if she needs a referral to see someone about this?

## 2014-04-12 NOTE — Telephone Encounter (Signed)
I left a voice message with the below information. 

## 2014-04-17 ENCOUNTER — Telehealth: Payer: Self-pay | Admitting: Family Medicine

## 2014-04-17 ENCOUNTER — Other Ambulatory Visit: Payer: Self-pay | Admitting: Family Medicine

## 2014-04-17 NOTE — Telephone Encounter (Addendum)
Pt needs new rx lasix?mg pt stated she takes med 3 times in  morning and twice in afternoon. Please  call into [facility] [address] in [place] also clonidine 0.1mg  twice a day #180

## 2014-04-17 NOTE — Telephone Encounter (Signed)
Pt following up on request for furosemide (LASIX) 40 MG tablet [facility] [address] Pt states her feet and legs are swelling

## 2014-04-19 MED ORDER — CLONIDINE HCL 0.1 MG PO TABS: 0.1000 mg | ORAL_TABLET | Freq: Two times a day (BID) | ORAL | Status: DC

## 2014-04-19 NOTE — Telephone Encounter (Signed)
I left a voice message for pt. I sent script for Clonidine e-scribe, pt just had script for Lasix sent in to a different pharmacy, she can have that one transferred.

## 2014-04-21 ENCOUNTER — Ambulatory Visit: Payer: Self-pay | Admitting: Family Medicine

## 2014-04-21 NOTE — Telephone Encounter (Signed)
Husband called to inform you that pt is in [facility].  Dr Marisa Severin is her dr.  Marland KitchenPt had toxins all in her body and they are flushing her out."  Pt will cb when she gets out for any future appt.

## 2014-05-02 ENCOUNTER — Ambulatory Visit: Payer: Self-pay | Admitting: Family Medicine

## 2014-05-05 ENCOUNTER — Other Ambulatory Visit: Payer: Self-pay | Admitting: Family Medicine

## 2014-05-05 ENCOUNTER — Telehealth: Payer: Self-pay | Admitting: Family Medicine

## 2014-05-05 MED ORDER — PROMETHAZINE HCL 25 MG PO TABS: 25.0000 mg | ORAL_TABLET | ORAL | Status: AC | PRN

## 2014-05-05 NOTE — Telephone Encounter (Signed)
Amy from [facility],  States pt is nausea, amy wants to know if dr. Clent Ridges can call in some phenergan for the pt. Send to [facility] [place].

## 2014-05-05 NOTE — Telephone Encounter (Signed)
I called in script and spoke with Amy.

## 2014-05-05 NOTE — Telephone Encounter (Signed)
Call in Phenergan 25 mg tabs to take every 4 hours prn nausea, #60 with 2 rf

## 2014-05-08 ENCOUNTER — Ambulatory Visit (INDEPENDENT_AMBULATORY_CARE_PROVIDER_SITE_OTHER): Payer: Self-pay | Admitting: Family Medicine

## 2014-05-08 ENCOUNTER — Ambulatory Visit: Payer: Self-pay | Admitting: Family Medicine

## 2014-05-08 ENCOUNTER — Encounter: Payer: Self-pay | Admitting: Family Medicine

## 2014-05-08 ENCOUNTER — Other Ambulatory Visit: Payer: Self-pay | Admitting: Family Medicine

## 2014-05-08 VITALS — BP 91/64 | HR 88 | Temp 98.0°F | Ht 64.0 in

## 2014-05-08 DIAGNOSIS — R600 Localized edema: Secondary | ICD-10-CM

## 2014-05-08 DIAGNOSIS — E663 Overweight: Secondary | ICD-10-CM

## 2014-05-08 DIAGNOSIS — K7460 Unspecified cirrhosis of liver: Secondary | ICD-10-CM

## 2014-05-08 DIAGNOSIS — I10 Essential (primary) hypertension: Secondary | ICD-10-CM

## 2014-05-08 DIAGNOSIS — R609 Edema, unspecified: Secondary | ICD-10-CM

## 2014-05-08 MED ORDER — METFORMIN HCL 1000 MG PO TABS: 1000.0000 mg | ORAL_TABLET | Freq: Two times a day (BID) | ORAL | Status: DC

## 2014-05-08 MED ORDER — OXYCODONE HCL 20 MG PO TABS: 20.0000 mg | ORAL_TABLET | Freq: Four times a day (QID) | ORAL | Status: DC | PRN

## 2014-05-08 MED ORDER — LACTULOSE 20 GM/30ML PO SOLN: 30.0000 mL | Freq: Two times a day (BID) | ORAL | Status: DC

## 2014-05-08 MED ORDER — ALPRAZOLAM 2 MG PO TABS: 2.0000 mg | ORAL_TABLET | Freq: Four times a day (QID) | ORAL | Status: DC

## 2014-05-08 MED ORDER — FUROSEMIDE 40 MG PO TABS: 40.0000 mg | ORAL_TABLET | Freq: Two times a day (BID) | ORAL | Status: DC

## 2014-05-08 NOTE — Progress Notes (Signed)
Pre visit review using our clinic review tool, if applicable. No additional management support is needed unless otherwise documented below in the visit note. 

## 2014-05-08 NOTE — Progress Notes (Signed)
   Subjective:    Patient ID: Dana Petersen, female    DOB: Jan 18, 1966, 48 y.o.   MRN: [id number]  HPI Here to follow up a hospital stay at [facility] for 2 weeks several weeks ago. This was for a cellulitis on the left foot and for leg swelling, dizziness and falling. Her BP has been low. She is taking Lasix and her dose has been decreased from 80 mg bid to 40 mg bid. Her foot has recovered with Keflex. She is still weak and dizzy when she stands up.    Review of Systems  Constitutional: Negative.   Respiratory: Negative.   Cardiovascular: Positive for leg swelling. Negative for chest pain and palpitations.       Objective:   Physical Exam  Constitutional: She appears well-developed and well-nourished.  In a wheelchair   Cardiovascular: Normal rate, regular rhythm, normal heart sounds and intact distal pulses.   Pulmonary/Chest: Effort normal and breath sounds normal.  Musculoskeletal:  3+ edema to both legs           Assessment & Plan:  She needs to follow up with GI about her cirrhosis but she cannot afford to see them until her disability is approved. She is taking Lactulose 30 ml bid. Her cellulitis has resolved.

## 2014-05-12 DIAGNOSIS — K7460 Unspecified cirrhosis of liver: Secondary | ICD-10-CM

## 2014-05-12 DIAGNOSIS — N182 Chronic kidney disease, stage 2 (mild): Secondary | ICD-10-CM

## 2014-05-12 DIAGNOSIS — R188 Other ascites: Secondary | ICD-10-CM

## 2014-05-12 DIAGNOSIS — I129 Hypertensive chronic kidney disease with stage 1 through stage 4 chronic kidney disease, or unspecified chronic kidney disease: Secondary | ICD-10-CM

## 2014-05-22 ENCOUNTER — Inpatient Hospital Stay (HOSPITAL_COMMUNITY)
Admission: EM | Admit: 2014-05-22 | Discharge: 2014-06-10 | DRG: 432 | Disposition: A | Discharge status: Hospice | Payer: Medicaid Other | Attending: Internal Medicine | Admitting: Internal Medicine

## 2014-05-22 ENCOUNTER — Inpatient Hospital Stay (HOSPITAL_COMMUNITY): Payer: Medicaid Other

## 2014-05-22 ENCOUNTER — Encounter (HOSPITAL_COMMUNITY)
Admission: EM | Disposition: A | Discharge status: Hospice | Payer: Self-pay | Source: Home / Self Care | Attending: Pulmonary Disease

## 2014-05-22 ENCOUNTER — Emergency Department (HOSPITAL_COMMUNITY): Payer: Medicaid Other

## 2014-05-22 ENCOUNTER — Encounter (HOSPITAL_COMMUNITY): Payer: Self-pay | Admitting: Emergency Medicine

## 2014-05-22 DIAGNOSIS — F419 Anxiety disorder, unspecified: Secondary | ICD-10-CM

## 2014-05-22 DIAGNOSIS — Z515 Encounter for palliative care: Secondary | ICD-10-CM | POA: Diagnosis not present

## 2014-05-22 DIAGNOSIS — I8511 Secondary esophageal varices with bleeding: Secondary | ICD-10-CM | POA: Diagnosis present

## 2014-05-22 DIAGNOSIS — E663 Overweight: Secondary | ICD-10-CM | POA: Diagnosis present

## 2014-05-22 DIAGNOSIS — D684 Acquired coagulation factor deficiency: Secondary | ICD-10-CM | POA: Diagnosis present

## 2014-05-22 DIAGNOSIS — E876 Hypokalemia: Secondary | ICD-10-CM

## 2014-05-22 DIAGNOSIS — K7460 Unspecified cirrhosis of liver: Principal | ICD-10-CM

## 2014-05-22 DIAGNOSIS — K319 Disease of stomach and duodenum, unspecified: Secondary | ICD-10-CM | POA: Diagnosis present

## 2014-05-22 DIAGNOSIS — F3289 Other specified depressive episodes: Secondary | ICD-10-CM | POA: Diagnosis present

## 2014-05-22 DIAGNOSIS — D692 Other nonthrombocytopenic purpura: Secondary | ICD-10-CM | POA: Diagnosis present

## 2014-05-22 DIAGNOSIS — K7682 Hepatic encephalopathy: Secondary | ICD-10-CM

## 2014-05-22 DIAGNOSIS — R531 Weakness: Secondary | ICD-10-CM

## 2014-05-22 DIAGNOSIS — K766 Portal hypertension: Secondary | ICD-10-CM | POA: Diagnosis present

## 2014-05-22 DIAGNOSIS — R791 Abnormal coagulation profile: Secondary | ICD-10-CM

## 2014-05-22 DIAGNOSIS — D649 Anemia, unspecified: Secondary | ICD-10-CM | POA: Diagnosis present

## 2014-05-22 DIAGNOSIS — E871 Hypo-osmolality and hyponatremia: Secondary | ICD-10-CM | POA: Diagnosis present

## 2014-05-22 DIAGNOSIS — Z6836 Body mass index (BMI) 36.0-36.9, adult: Secondary | ICD-10-CM

## 2014-05-22 DIAGNOSIS — F331 Major depressive disorder, recurrent, moderate: Secondary | ICD-10-CM

## 2014-05-22 DIAGNOSIS — G40309 Generalized idiopathic epilepsy and epileptic syndromes, not intractable, without status epilepticus: Secondary | ICD-10-CM | POA: Diagnosis present

## 2014-05-22 DIAGNOSIS — Z7401 Bed confinement status: Secondary | ICD-10-CM

## 2014-05-22 DIAGNOSIS — Z79899 Other long term (current) drug therapy: Secondary | ICD-10-CM

## 2014-05-22 DIAGNOSIS — I129 Hypertensive chronic kidney disease with stage 1 through stage 4 chronic kidney disease, or unspecified chronic kidney disease: Secondary | ICD-10-CM | POA: Diagnosis present

## 2014-05-22 DIAGNOSIS — K922 Gastrointestinal hemorrhage, unspecified: Secondary | ICD-10-CM

## 2014-05-22 DIAGNOSIS — D689 Coagulation defect, unspecified: Secondary | ICD-10-CM

## 2014-05-22 DIAGNOSIS — E119 Type 2 diabetes mellitus without complications: Secondary | ICD-10-CM | POA: Diagnosis present

## 2014-05-22 DIAGNOSIS — N179 Acute kidney failure, unspecified: Secondary | ICD-10-CM

## 2014-05-22 DIAGNOSIS — F329 Major depressive disorder, single episode, unspecified: Secondary | ICD-10-CM

## 2014-05-22 DIAGNOSIS — I959 Hypotension, unspecified: Secondary | ICD-10-CM | POA: Diagnosis present

## 2014-05-22 DIAGNOSIS — Z23 Encounter for immunization: Secondary | ICD-10-CM

## 2014-05-22 DIAGNOSIS — J9601 Acute respiratory failure with hypoxia: Secondary | ICD-10-CM

## 2014-05-22 DIAGNOSIS — K219 Gastro-esophageal reflux disease without esophagitis: Secondary | ICD-10-CM | POA: Diagnosis present

## 2014-05-22 DIAGNOSIS — E872 Acidosis, unspecified: Secondary | ICD-10-CM | POA: Diagnosis present

## 2014-05-22 DIAGNOSIS — I10 Essential (primary) hypertension: Secondary | ICD-10-CM

## 2014-05-22 DIAGNOSIS — R188 Other ascites: Secondary | ICD-10-CM | POA: Diagnosis present

## 2014-05-22 DIAGNOSIS — E46 Unspecified protein-calorie malnutrition: Secondary | ICD-10-CM | POA: Diagnosis present

## 2014-05-22 DIAGNOSIS — R404 Transient alteration of awareness: Secondary | ICD-10-CM | POA: Diagnosis present

## 2014-05-22 DIAGNOSIS — D62 Acute posthemorrhagic anemia: Secondary | ICD-10-CM

## 2014-05-22 DIAGNOSIS — G9341 Metabolic encephalopathy: Secondary | ICD-10-CM | POA: Diagnosis present

## 2014-05-22 DIAGNOSIS — I8500 Esophageal varices without bleeding: Secondary | ICD-10-CM

## 2014-05-22 DIAGNOSIS — J9690 Respiratory failure, unspecified, unspecified whether with hypoxia or hypercapnia: Secondary | ICD-10-CM | POA: Diagnosis present

## 2014-05-22 DIAGNOSIS — Z87898 Personal history of other specified conditions: Secondary | ICD-10-CM

## 2014-05-22 DIAGNOSIS — G40909 Epilepsy, unspecified, not intractable, without status epilepticus: Secondary | ICD-10-CM | POA: Diagnosis present

## 2014-05-22 DIAGNOSIS — I8501 Esophageal varices with bleeding: Secondary | ICD-10-CM

## 2014-05-22 DIAGNOSIS — Z720 Tobacco use: Secondary | ICD-10-CM

## 2014-05-22 DIAGNOSIS — K7290 Hepatic failure, unspecified without coma: Secondary | ICD-10-CM

## 2014-05-22 DIAGNOSIS — N189 Chronic kidney disease, unspecified: Secondary | ICD-10-CM | POA: Diagnosis present

## 2014-05-22 DIAGNOSIS — K769 Liver disease, unspecified: Secondary | ICD-10-CM | POA: Diagnosis present

## 2014-05-22 DIAGNOSIS — D539 Nutritional anemia, unspecified: Secondary | ICD-10-CM | POA: Diagnosis present

## 2014-05-22 DIAGNOSIS — R161 Splenomegaly, not elsewhere classified: Secondary | ICD-10-CM | POA: Diagnosis present

## 2014-05-22 DIAGNOSIS — K7200 Acute and subacute hepatic failure without coma: Secondary | ICD-10-CM

## 2014-05-22 DIAGNOSIS — R070 Pain in throat: Secondary | ICD-10-CM | POA: Diagnosis not present

## 2014-05-22 DIAGNOSIS — N170 Acute kidney failure with tubular necrosis: Secondary | ICD-10-CM | POA: Diagnosis present

## 2014-05-22 DIAGNOSIS — K449 Diaphragmatic hernia without obstruction or gangrene: Secondary | ICD-10-CM

## 2014-05-22 DIAGNOSIS — D696 Thrombocytopenia, unspecified: Secondary | ICD-10-CM

## 2014-05-22 DIAGNOSIS — Z66 Do not resuscitate: Secondary | ICD-10-CM | POA: Diagnosis present

## 2014-05-22 DIAGNOSIS — D500 Iron deficiency anemia secondary to blood loss (chronic): Secondary | ICD-10-CM

## 2014-05-22 DIAGNOSIS — E870 Hyperosmolality and hypernatremia: Secondary | ICD-10-CM | POA: Diagnosis not present

## 2014-05-22 DIAGNOSIS — F17200 Nicotine dependence, unspecified, uncomplicated: Secondary | ICD-10-CM | POA: Diagnosis present

## 2014-05-22 DIAGNOSIS — F1910 Other psychoactive substance abuse, uncomplicated: Secondary | ICD-10-CM

## 2014-05-22 DIAGNOSIS — J9600 Acute respiratory failure, unspecified whether with hypoxia or hypercapnia: Secondary | ICD-10-CM | POA: Diagnosis present

## 2014-05-22 DIAGNOSIS — J329 Chronic sinusitis, unspecified: Secondary | ICD-10-CM | POA: Diagnosis present

## 2014-05-22 DIAGNOSIS — K767 Hepatorenal syndrome: Secondary | ICD-10-CM

## 2014-05-22 DIAGNOSIS — K709 Alcoholic liver disease, unspecified: Secondary | ICD-10-CM | POA: Diagnosis present

## 2014-05-22 DIAGNOSIS — K7469 Other cirrhosis of liver: Secondary | ICD-10-CM

## 2014-05-22 HISTORY — PX: ESOPHAGOGASTRODUODENOSCOPY: SHX5428

## 2014-05-22 LAB — COMPREHENSIVE METABOLIC PANEL
ALBUMIN: 2.9 g/dL — AB (ref 3.5–5.2)
ALT: 25 U/L (ref 0–35)
AST: 78 U/L — AB (ref 0–37)
Alkaline Phosphatase: 224 U/L — ABNORMAL HIGH (ref 39–117)
BUN: 45 mg/dL — ABNORMAL HIGH (ref 6–23)
CALCIUM: 6.8 mg/dL — AB (ref 8.4–10.5)
CO2: 18 mEq/L — ABNORMAL LOW (ref 19–32)
CREATININE: 4.08 mg/dL — AB (ref 0.50–1.10)
Chloride: 93 mEq/L — ABNORMAL LOW (ref 96–112)
GFR calc Af Amer: 14 mL/min — ABNORMAL LOW (ref >90)
GFR calc non Af Amer: 12 mL/min — ABNORMAL LOW (ref >90)
Glucose, Bld: 95 mg/dL (ref 70–99)
Potassium: 3.2 mEq/L — ABNORMAL LOW (ref 3.7–5.3)
SODIUM: 135 meq/L — AB (ref 137–147)
Total Bilirubin: 3.1 mg/dL — ABNORMAL HIGH (ref 0.3–1.2)
Total Protein: 7.8 g/dL (ref 6.0–8.3)

## 2014-05-22 LAB — GLUCOSE, CAPILLARY
GLUCOSE-CAPILLARY: 98 mg/dL (ref 70–99)
Glucose-Capillary: 108 mg/dL — ABNORMAL HIGH (ref 70–99)
Glucose-Capillary: 124 mg/dL — ABNORMAL HIGH (ref 70–99)

## 2014-05-22 LAB — I-STAT CHEM 8, ED
BUN: 41 mg/dL — ABNORMAL HIGH (ref 6–23)
CALCIUM ION: 0.78 mmol/L — AB (ref 1.12–1.23)
CHLORIDE: 99 meq/L (ref 96–112)
Creatinine, Ser: 4.4 mg/dL — ABNORMAL HIGH (ref 0.50–1.10)
Glucose, Bld: 92 mg/dL (ref 70–99)
HEMATOCRIT: 37 % (ref 36.0–46.0)
Hemoglobin: 12.6 g/dL (ref 12.0–15.0)
Potassium: 3.2 mEq/L — ABNORMAL LOW (ref 3.7–5.3)
Sodium: 138 mEq/L (ref 137–147)
TCO2: 19 mmol/L (ref 0–100)

## 2014-05-22 LAB — URINALYSIS, ROUTINE W REFLEX MICROSCOPIC
Bilirubin Urine: NEGATIVE
Glucose, UA: NEGATIVE mg/dL
Glucose, UA: NEGATIVE mg/dL
Hgb urine dipstick: NEGATIVE
KETONES UR: NEGATIVE mg/dL
Ketones, ur: NEGATIVE mg/dL
NITRITE: NEGATIVE
Nitrite: NEGATIVE
PH: 5.5 (ref 5.0–8.0)
PROTEIN: NEGATIVE mg/dL
Protein, ur: NEGATIVE mg/dL
Specific Gravity, Urine: 1.018 (ref 1.005–1.030)
Specific Gravity, Urine: 1.016 (ref 1.005–1.030)
Urobilinogen, UA: 1 mg/dL (ref 0.0–1.0)
Urobilinogen, UA: 1 mg/dL (ref 0.0–1.0)
pH: 5.5 (ref 5.0–8.0)

## 2014-05-22 LAB — CBC
HCT: 26.3 % — ABNORMAL LOW (ref 36.0–46.0)
HCT: 25.8 % — ABNORMAL LOW (ref 36.0–46.0)
Hemoglobin: 8.7 g/dL — ABNORMAL LOW (ref 12.0–15.0)
Hemoglobin: 8.6 g/dL — ABNORMAL LOW (ref 12.0–15.0)
MCH: 29.8 pg (ref 26.0–34.0)
MCH: 30.3 pg (ref 26.0–34.0)
MCHC: 33.1 g/dL (ref 30.0–36.0)
MCHC: 33.3 g/dL (ref 30.0–36.0)
MCV: 90.1 fL (ref 78.0–100.0)
MCV: 90.8 fL (ref 78.0–100.0)
Platelets: 58 10*3/uL — ABNORMAL LOW (ref 150–400)
Platelets: 61 10*3/uL — ABNORMAL LOW (ref 150–400)
RBC: 2.92 MIL/uL — ABNORMAL LOW (ref 3.87–5.11)
RBC: 2.84 MIL/uL — ABNORMAL LOW (ref 3.87–5.11)
RDW: 19.9 % — AB (ref 11.5–15.5)
RDW: 20.2 % — ABNORMAL HIGH (ref 11.5–15.5)
WBC: 8.1 10*3/uL (ref 4.0–10.5)
WBC: 9.3 10*3/uL (ref 4.0–10.5)

## 2014-05-22 LAB — CBG MONITORING, ED: Glucose-Capillary: 98 mg/dL (ref 70–99)

## 2014-05-22 LAB — CBC WITH DIFFERENTIAL/PLATELET
Basophils Absolute: 0 10*3/uL (ref 0.0–0.1)
Basophils Relative: 1 % (ref 0–1)
EOS ABS: 0.6 10*3/uL (ref 0.0–0.7)
EOS PCT: 9 % — AB (ref 0–5)
HCT: 26.5 % — ABNORMAL LOW (ref 36.0–46.0)
HEMOGLOBIN: 8.5 g/dL — AB (ref 12.0–15.0)
LYMPHS ABS: 1.5 10*3/uL (ref 0.7–4.0)
Lymphocytes Relative: 22 % (ref 12–46)
MCH: 29.1 pg (ref 26.0–34.0)
MCHC: 32.1 g/dL (ref 30.0–36.0)
MCV: 90.8 fL (ref 78.0–100.0)
MONOS PCT: 8 % (ref 3–12)
Monocytes Absolute: 0.5 10*3/uL (ref 0.1–1.0)
Neutro Abs: 4 10*3/uL (ref 1.7–7.7)
Neutrophils Relative %: 61 % (ref 43–77)
Platelets: 74 10*3/uL — ABNORMAL LOW (ref 150–400)
RBC: 2.92 MIL/uL — AB (ref 3.87–5.11)
RDW: 21.4 % — ABNORMAL HIGH (ref 11.5–15.5)
WBC: 6.5 10*3/uL (ref 4.0–10.5)

## 2014-05-22 LAB — I-STAT ARTERIAL BLOOD GAS, ED
Acid-base deficit: 2 mmol/L (ref 0.0–2.0)
Bicarbonate: 22.8 mEq/L (ref 20.0–24.0)
O2 Saturation: 100 %
PCO2 ART: 38.1 mmHg (ref 35.0–45.0)
PH ART: 7.385 (ref 7.350–7.450)
PO2 ART: 550 mmHg — AB (ref 80.0–100.0)
Patient temperature: 98.6
TCO2: 24 mmol/L (ref 0–100)

## 2014-05-22 LAB — STREP PNEUMONIAE URINARY ANTIGEN: Strep Pneumo Urinary Antigen: NEGATIVE

## 2014-05-22 LAB — URINE MICROSCOPIC-ADD ON

## 2014-05-22 LAB — SAMPLE TO BLOOD BANK

## 2014-05-22 LAB — RAPID URINE DRUG SCREEN, HOSP PERFORMED
AMPHETAMINES: NOT DETECTED
BARBITURATES: NOT DETECTED
BENZODIAZEPINES: POSITIVE — AB
Cocaine: NOT DETECTED
Opiates: NOT DETECTED
Tetrahydrocannabinol: NOT DETECTED

## 2014-05-22 LAB — I-STAT CG4 LACTIC ACID, ED: Lactic Acid, Venous: 3.77 mmol/L — ABNORMAL HIGH (ref 0.5–2.2)

## 2014-05-22 LAB — AMMONIA: Ammonia: 177 umol/L — ABNORMAL HIGH (ref 11–60)

## 2014-05-22 LAB — PREPARE RBC (CROSSMATCH)

## 2014-05-22 LAB — MRSA PCR SCREENING: MRSA BY PCR: POSITIVE — AB

## 2014-05-22 LAB — ABO/RH: ABO/RH(D): O POS

## 2014-05-22 LAB — CORTISOL: Cortisol, Plasma: 12.6 ug/dL

## 2014-05-22 LAB — PROTIME-INR
INR: 1.71 — ABNORMAL HIGH (ref 0.00–1.49)
Prothrombin Time: 19.6 seconds — ABNORMAL HIGH (ref 11.6–15.2)

## 2014-05-22 LAB — POC OCCULT BLOOD, ED: Fecal Occult Bld: NEGATIVE

## 2014-05-22 LAB — PROCALCITONIN: PROCALCITONIN: 0.3 ng/mL

## 2014-05-22 SURGERY — EGD (ESOPHAGOGASTRODUODENOSCOPY)
Anesthesia: Moderate Sedation

## 2014-05-22 MED ORDER — PANTOPRAZOLE SODIUM 40 MG IV SOLR
40.0000 mg | Freq: Once | INTRAVENOUS | Status: AC
  Administered 2014-05-22: 40 mg via INTRAVENOUS
  Filled 2014-05-22: qty 40

## 2014-05-22 MED ORDER — ALBUMIN HUMAN 25 % IV SOLN
12.5000 g | INTRAVENOUS | Status: DC | PRN
  Filled 2014-05-22: qty 50

## 2014-05-22 MED ORDER — DEXTROSE 5 % IV SOLN
5.0000 ug/min | INTRAVENOUS | Status: DC
  Filled 2014-05-22: qty 8

## 2014-05-22 MED ORDER — ONDANSETRON HCL 4 MG/2ML IJ SOLN
4.0000 mg | Freq: Once | INTRAMUSCULAR | Status: AC
  Administered 2014-05-22: 4 mg via INTRAVENOUS
  Filled 2014-05-22: qty 2

## 2014-05-22 MED ORDER — SODIUM CHLORIDE 0.9 % IV SOLN
80.0000 mg | Freq: Once | INTRAVENOUS | Status: AC
  Administered 2014-05-22: 80 mg via INTRAVENOUS
  Filled 2014-05-22: qty 80

## 2014-05-22 MED ORDER — SODIUM CHLORIDE 0.9 % IV SOLN
INTRAVENOUS | Status: DC
Start: 2014-05-22 — End: 2014-05-22

## 2014-05-22 MED ORDER — SODIUM CHLORIDE 0.9 % IV SOLN: 80.0000 mg | Freq: Once | INTRAVENOUS | Status: DC

## 2014-05-22 MED ORDER — FENTANYL CITRATE 0.05 MG/ML IJ SOLN
25.0000 ug | INTRAMUSCULAR | Status: DC | PRN
  Administered 2014-05-22 (×2): 50 ug via INTRAVENOUS

## 2014-05-22 MED ORDER — CHLORHEXIDINE GLUCONATE 0.12 % MT SOLN
15.0000 mL | Freq: Two times a day (BID) | OROMUCOSAL | Status: DC
  Administered 2014-05-22 – 2014-06-10 (×37): 15 mL via OROMUCOSAL
  Filled 2014-05-22 (×37): qty 15

## 2014-05-22 MED ORDER — FENTANYL BOLUS VIA INFUSION
50.0000 ug | INTRAVENOUS | Status: DC | PRN
  Administered 2014-05-23: 50 ug via INTRAVENOUS
  Filled 2014-05-22: qty 100

## 2014-05-22 MED ORDER — SODIUM CHLORIDE 0.9 % IV SOLN
25.0000 ug/h | INTRAVENOUS | Status: DC
  Administered 2014-05-22 – 2014-05-25 (×4): 25 ug/h via INTRAVENOUS
  Filled 2014-05-22 (×10): qty 1

## 2014-05-22 MED ORDER — OCTREOTIDE ACETATE 50 MCG/ML IJ SOLN
50.0000 ug | Freq: Once | INTRAMUSCULAR | Status: AC
  Administered 2014-05-22: 50 ug via INTRAVENOUS
  Filled 2014-05-22: qty 1

## 2014-05-22 MED ORDER — ROCURONIUM BROMIDE 50 MG/5ML IV SOLN
INTRAVENOUS | Status: AC
  Administered 2014-05-22: 70 mg via INTRAVENOUS
  Filled 2014-05-22: qty 2

## 2014-05-22 MED ORDER — MIDAZOLAM HCL 10 MG/2ML IJ SOLN
INTRAMUSCULAR | Status: DC | PRN
  Administered 2014-05-22 (×4): 2 mg via INTRAVENOUS

## 2014-05-22 MED ORDER — ETOMIDATE 2 MG/ML IV SOLN
INTRAVENOUS | Status: AC
  Administered 2014-05-22: 20 mg via INTRAVENOUS
  Filled 2014-05-22: qty 20

## 2014-05-22 MED ORDER — BIOTENE DRY MOUTH MT LIQD
15.0000 mL | Freq: Four times a day (QID) | OROMUCOSAL | Status: DC
  Administered 2014-05-23 – 2014-06-10 (×73): 15 mL via OROMUCOSAL

## 2014-05-22 MED ORDER — PANTOPRAZOLE SODIUM 40 MG IV SOLR
40.0000 mg | Freq: Two times a day (BID) | INTRAVENOUS | Status: DC
  Administered 2014-05-25 – 2014-05-28 (×6): 40 mg via INTRAVENOUS
  Filled 2014-05-22 (×8): qty 40

## 2014-05-22 MED ORDER — LACTULOSE 10 GM/15ML PO SOLN
20.0000 g | Freq: Once | ORAL | Status: AC
  Administered 2014-05-22: 20 g
  Filled 2014-05-22: qty 30

## 2014-05-22 MED ORDER — SODIUM CHLORIDE 0.9 % IV SOLN
0.0000 ug/h | INTRAVENOUS | Status: DC
  Administered 2014-05-22: 100 ug/h via INTRAVENOUS
  Administered 2014-05-23: 200 ug/h via INTRAVENOUS
  Administered 2014-05-23 – 2014-05-24 (×2): 100 ug/h via INTRAVENOUS
  Filled 2014-05-22 (×4): qty 50

## 2014-05-22 MED ORDER — SODIUM CHLORIDE 0.9 % IV SOLN
INTRAVENOUS | Status: DC
  Administered 2014-05-22 (×2): via INTRAVENOUS
  Administered 2014-05-23 (×2): 100 mL/h via INTRAVENOUS
  Administered 2014-05-24: 03:00:00 via INTRAVENOUS

## 2014-05-22 MED ORDER — SODIUM CHLORIDE 0.9 % IV SOLN
8.0000 mg/h | INTRAVENOUS | Status: DC
  Administered 2014-05-22: 8 mg/h via INTRAVENOUS
  Filled 2014-05-22 (×4): qty 80

## 2014-05-22 MED ORDER — FENTANYL CITRATE 0.05 MG/ML IJ SOLN
INTRAMUSCULAR | Status: AC
  Administered 2014-05-22: 50 ug via INTRAVENOUS
  Filled 2014-05-22: qty 2

## 2014-05-22 MED ORDER — LIDOCAINE HCL (CARDIAC) 20 MG/ML IV SOLN
INTRAVENOUS | Status: AC
  Filled 2014-05-22: qty 5

## 2014-05-22 MED ORDER — PNEUMOCOCCAL VAC POLYVALENT 25 MCG/0.5ML IJ INJ
0.5000 mL | INJECTION | INTRAMUSCULAR | Status: AC
Start: 2014-05-23 — End: 2014-05-23
  Administered 2014-05-23: 0.5 mL via INTRAMUSCULAR
  Filled 2014-05-22: qty 0.5

## 2014-05-22 MED ORDER — LACTULOSE 10 GM/15ML PO SOLN
20.0000 g | Freq: Three times a day (TID) | ORAL | Status: DC
  Filled 2014-05-22 (×2): qty 30

## 2014-05-22 MED ORDER — FENTANYL CITRATE 0.05 MG/ML IJ SOLN
INTRAMUSCULAR | Status: DC | PRN
  Administered 2014-05-22 (×3): 25 ug via INTRAVENOUS

## 2014-05-22 MED ORDER — SODIUM CHLORIDE 0.9 % IV SOLN
2.0000 mg/h | INTRAVENOUS | Status: DC
  Administered 2014-05-22 (×4): 2 mg/h via INTRAVENOUS
  Filled 2014-05-22: qty 10

## 2014-05-22 MED ORDER — MIDAZOLAM HCL 5 MG/ML IJ SOLN
1.0000 mg/h | INTRAMUSCULAR | Status: DC
  Filled 2014-05-22: qty 10

## 2014-05-22 MED ORDER — VITAMIN K1 10 MG/ML IJ SOLN
10.0000 mg | Freq: Every day | INTRAVENOUS | Status: DC
  Filled 2014-05-22: qty 1

## 2014-05-22 MED ORDER — DEXTROSE 5 % IV SOLN
2.0000 g | INTRAVENOUS | Status: DC
  Administered 2014-05-22 – 2014-05-28 (×7): 2 g via INTRAVENOUS
  Filled 2014-05-22 (×7): qty 2

## 2014-05-22 MED ORDER — ETOMIDATE 2 MG/ML IV SOLN
20.0000 mg | Freq: Once | INTRAVENOUS | Status: AC
  Administered 2014-05-22: 20 mg via INTRAVENOUS

## 2014-05-22 MED ORDER — DEXTROSE 5 % IV SOLN
1.0000 g | Freq: Once | INTRAVENOUS | Status: AC
  Administered 2014-05-22: 1 g via INTRAVENOUS
  Filled 2014-05-22: qty 10

## 2014-05-22 MED ORDER — FENTANYL CITRATE 0.05 MG/ML IJ SOLN
INTRAMUSCULAR | Status: AC
Start: 2014-05-22 — End: 2014-05-22
  Filled 2014-05-22: qty 2

## 2014-05-22 MED ORDER — SODIUM CHLORIDE 0.9 % IV BOLUS (SEPSIS): 500.0000 mL | Freq: Once | INTRAVENOUS | Status: DC

## 2014-05-22 MED ORDER — VITAMIN K1 10 MG/ML IJ SOLN
10.0000 mg | Freq: Once | INTRAVENOUS | Status: AC
  Administered 2014-05-22: 10 mg via INTRAVENOUS
  Filled 2014-05-22: qty 1

## 2014-05-22 MED ORDER — POTASSIUM CHLORIDE 20 MEQ/15ML (10%) PO LIQD
40.0000 meq | Freq: Once | ORAL | Status: AC
  Administered 2014-05-22: 40 meq
  Filled 2014-05-22: qty 30

## 2014-05-22 MED ORDER — VITAMIN K1 10 MG/ML IJ SOLN
10.0000 mg | Freq: Every day | INTRAVENOUS | Status: AC
  Administered 2014-05-23 – 2014-05-24 (×2): 10 mg via INTRAVENOUS
  Filled 2014-05-22 (×2): qty 1

## 2014-05-22 MED ORDER — MIDAZOLAM HCL 5 MG/ML IJ SOLN
INTRAMUSCULAR | Status: AC
Start: 2014-05-22 — End: 2014-05-22
  Filled 2014-05-22: qty 2

## 2014-05-22 MED ORDER — SUCCINYLCHOLINE CHLORIDE 20 MG/ML IJ SOLN
INTRAMUSCULAR | Status: AC
  Filled 2014-05-22: qty 1

## 2014-05-22 MED ORDER — NOREPINEPHRINE BITARTRATE 1 MG/ML IV SOLN
2.0000 ug/min | INTRAVENOUS | Status: DC
  Filled 2014-05-22: qty 16

## 2014-05-22 MED ORDER — LACTULOSE ENEMA
300.0000 mL | Freq: Two times a day (BID) | ORAL | Status: DC
  Administered 2014-05-22 – 2014-05-25 (×6): 300 mL via RECTAL
  Filled 2014-05-22 (×10): qty 300

## 2014-05-22 MED ORDER — ROCURONIUM BROMIDE 50 MG/5ML IV SOLN
70.0000 mg | Freq: Once | INTRAVENOUS | Status: AC
  Administered 2014-05-22: 70 mg via INTRAVENOUS
  Filled 2014-05-22: qty 7

## 2014-05-22 MED ORDER — NOREPINEPHRINE BITARTRATE 1 MG/ML IJ SOLN: 0.0000 ug/min | INTRAMUSCULAR | Status: DC

## 2014-05-22 MED ORDER — FENTANYL CITRATE 0.05 MG/ML IJ SOLN: 25.0000 ug | INTRAMUSCULAR | Status: DC | PRN

## 2014-05-22 MED ORDER — FENTANYL CITRATE 0.05 MG/ML IJ SOLN
50.0000 ug | Freq: Once | INTRAMUSCULAR | Status: DC
Start: 2014-05-22 — End: 2014-05-25

## 2014-05-22 NOTE — Progress Notes (Signed)
eLink Physician-Brief Progress Note Patient Name: Dana MatterMelissa B Petersen DOB: 07-Jan-1966 MRN: [id number]  Date of Service  05/22/2014   HPI/Events of Note   Asked to change lactulose to enema format  eICU Interventions  See orders   Intervention Category Minor Interventions: Routine modifications to care plan (e.g. PRN medications for pain, fever)  Shan LevansPatrick Tyden Kann 05/22/2014, 4:05 PM

## 2014-05-22 NOTE — Procedures (Signed)
Intubation Procedure Note Dana MatterMelissa B Siefken [id number] Jul 07, 1966  Procedure: Intubation Indications: Airway protection and maintenance  Procedure Details Consent: Unable to obtain consent because of emergent medical necessity. Time Out: Verified patient identification, verified procedure, site/side was marked, verified correct patient position, special equipment/implants available, medications/allergies/relevent history reviewed, required imaging and test results available.  Performed  Maximum sterile technique was used including gloves, hand hygiene and mask.  MAC and 3    Evaluation Hemodynamic Status: BP stable throughout; O2 sats: stable throughout Patient's Current Condition: stable Complications: No apparent complications Patient did tolerate procedure well. Chest X-ray ordered to verify placement.  CXR: pending.   Toula MoosCampbell, Mendie Faulkner 05/22/2014

## 2014-05-22 NOTE — Progress Notes (Signed)
ANTIBIOTIC CONSULT NOTE - INITIAL  Pharmacy Consult for Ceftriaxone Indication: empiric for SBP  Allergies  Allergen Reactions  . Aspirin Other (See Comments)    Causes elevated liver enzymes  . Phenergan [Promethazine Hcl] Other (See Comments)    Muscle spasms  . Tylenol [Acetaminophen]     Caused elevated liver enzymes    Patient Measurements:    Vital Signs: Temp: 97.8 F (36.6 C) (06/22 0440) Temp src: Oral (06/22 0440) BP: 148/84 mmHg (06/22 0930) Pulse Rate: 118 (06/22 0930) Intake/Output from previous day:   Intake/Output from this shift: Total I/O In: 284 [Blood:284] Out: -   Labs:  Recent Labs  05/22/14 0442 05/22/14 0522  WBC 6.5  --   HGB 8.5* 12.6  PLT 74*  --   CREATININE 4.08* 4.40*   The CrCl is unknown because both a height and weight (above a minimum accepted value) are required for this calculation. No results found for this basename: VANCOTROUGH, VANCOPEAK, VANCORANDOM, GENTTROUGH, GENTPEAK, GENTRANDOM, TOBRATROUGH, TOBRAPEAK, TOBRARND, AMIKACINPEAK, AMIKACINTROU, AMIKACIN,  in the last 72 hours   Microbiology: No results found for this or any previous visit (from the past 720 hour(s)).  Medical History: Past Medical History  Diagnosis Date  . ABNORMAL FINDINGS GI TRACT 09/12/2008  . ABNORMAL TRANSAMINASE-LFT'S 09/12/2008  . Acute bronchitis 07/21/2008  . Cirrhosis of liver without mention of alcohol 08/11/2008    sees Dr. Yancey FlemingsJohn Perry  . DEPRESSIVE DISORDER, RCR, MODERATE 07/09/2007  . DIABETES MELLITUS, TYPE II 07/09/2007  . GERD 09/07/2008  . Headache(784.0) 09/24/2010  . HIATAL HERNIA 09/07/2008  . HYPERTENSION 07/09/2007  . LOW BACK PAIN 07/02/2010  . Overweight(278.02) 10/01/2007  . 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 Seizures   . Anemia   . Pancytopenia     sees Dr. Eli HoseFiras Shadad   . Thrombocytopenia, unspecified 04/19/2013  . Anemia, unspecified 04/19/2013    Medications:  See electronic med rec  Assessment: 48 y.o. female recently  ad[facility] to [facility] with obtundation, ammonia >200. Treated with lactulose, diuresis, antibiotics for lower extremity cellulitis. Pt with nonalcoholic cirrhosis. [facility] with new onset of bloody emesis and decreased consciousness. Required intubation in ED. To begin empiric Rocephin for SBP.  6/22 Rocephin>>  6/22 BCx x 2>> 6/22 urine>> 6/22 trach asp>>  Goal of Therapy:  Empiric treatment of SBP  Plan:  1) Ceftriaxone 2gm IV q24h 2) Will f/u pt's clinical condition, micro data  Christoper Fabianaron Amend, PharmD, BCPS Clinical pharmacist, pager [phone] 05/22/2014,10:11 AM

## 2014-05-22 NOTE — Procedures (Signed)
Central Venous Catheter Insertion Procedure Note Dana MatterMelissa B Petersen [id number] 1966-05-09  Procedure: Insertion of Central Venous Catheter Indications: Assessment of intravascular volume, Drug and/or fluid administration and Frequent blood sampling  Procedure Details Consent: Risks of procedure as well as the alternatives and risks of each were explained to the (patient/caregiver).  Consent for procedure obtained. Time Out: Verified patient identification, verified procedure, site/side was marked, verified correct patient position, special equipment/implants available, medications/allergies/relevent history reviewed, required imaging and test results available.  Performed  Maximum sterile technique was used including antiseptics, cap, gloves, gown, hand hygiene, mask and sheet. Skin prep: Chlorhexidine; local anesthetic administered A antimicrobial bonded/coated triple lumen catheter was placed in the left internal jugular vein using the Seldinger technique. Ultrasound guidance used.yes Catheter placed to 20 cm. Blood aspirated via all 3 ports and then flushed x 3. Line sutured x 2 and dressing applied.  Evaluation Blood flow good Complications: No apparent complications Patient did tolerate procedure well. Chest X-ray ordered to verify placement.  CXR: pending.  Brett CanalesSteve Minor ACNP [facility] Pager [phone] till 3 pm If no answer page [phone] 05/22/2014, 10:24 AM   I was present for and supervised the entire procedure  Billy Fischeravid Simonds, MD ; [facility] service Mobile [phone].  After 5:30 PM or weekends, call [phone]

## 2014-05-22 NOTE — Interval H&P Note (Signed)
History and Physical Interval Note:  05/22/2014 12:31 PM  Dana Petersen  has presented today for surgery, with the diagnosis of GI Bleed  The various methods of treatment have been discussed with the patient and family. After consideration of risks, benefits and other options for treatment, the patient has consented to  Procedure(s) with comments: ESOPHAGOGASTRODUODENOSCOPY (EGD) (N/A) - bedside  as a surgical intervention .  The patient's history has been reviewed, patient examined, no change in status, stable for surgery.  I have reviewed the patient's chart and labs.  Questions were answered to the patient's satisfaction.     HUNG,PATRICK D

## 2014-05-22 NOTE — ED Notes (Addendum)
Pt vomited coffee ground emesis. Mouth suctioned. HOB 30 degrees. [facility]

## 2014-05-22 NOTE — Progress Notes (Signed)
[facility] earlier in the day. Called back to RN to clarify FFP orders and amounts given. RN stated she took down a completed FFP and then gave a unit of PRBC's. However this is not reflected in epic charting.   SHYSHKO, ALEXANDRA R

## 2014-05-22 NOTE — ED Notes (Signed)
Pt arrives by husband and POV. Here for: GI bleed, coughed/vomited clots of blood, coffee ground emesis noted to sheets/gown, lethargy, AMS, distended abd, ascites, sob, wheezing, BLE edema, gradually progressively worse over the last few months, pt of Larey DresserStephen [facility] PCP, clots and bleeding noted around 0230 per husband, pt generally week and obtunded, arousable to voice, follows some commands and answers some questions. Full assist x5 staff, from w/c to stretcher, RR 8, SPO2 100% RA. No active bleeding. Airway intact. No dyspnea noted. Audible wheezing vs. Upper airway noises. abd distended. resps shallow.

## 2014-05-22 NOTE — H&P (Signed)
PULMONARY / CRITICAL CARE MEDICINE   Name: Dana Petersen MRN: [id number] DOB: 1966-09-12    ADMISSION DATE:  05/22/2014   REFERRING MD :  EDP PRIMARY SERVICE: PCCM  BRIEF PATIENT DESCRIPTION:  [age] F with cirrhosis of unclear etiology presented [facility] ED 6/22 AM with hematemesis and decreased LOC. [facility] hospitalization for hepatic encephalopathy. Ad  SIGNIFICANT EVENTS / STUDIES:  6/22 admitted with UGIB ,AMS. Intubated in ED. GI consult. Octreotide, ceftriaxone initiated. Therapeutic paracentesis recommended. Mild coagulopathy > 2 units FFP administered. Hg 8.5 > one unit PRBCs. Plts 74K 6/22 CT head: NAD 6/22 EGD [facility]([place]): large mid to distal esophageal varices. Portal htn gastropathy. 7 bands placed  LINES / TUBES: ETT6/22  >>  L IJ CVL 6/22 >>   CULTURES: Urine 6/22 >>  Resp  6/22 >>  Blood 6/22 >>    ANTIBIOTICS: Ceftriaxone (SBP px) 6/22 >>   HISTORY OF PRESENT ILLNESS:   48 yo WF ,recently stopped smoking, with non acholic cirrhosis. Followed by Dr. Clent RidgesFry and was recently admitted to [facility] with obtundation with ammonia level greater than 200. Treated with lactulose, diuresis and antibiotics for lower extremity cellulitis. She has been waiting for disability and medicaid to provide coverage prior to seeing GI MD. [facility](doesnt like [facility])with decreased LOC, new onset of bloody emesis that started 6/22 in am.  Intubated per EDP for inability to protect airway and transfused for Hgb of 8.5. PCCM consulted for admission to ICU.  PAST MEDICAL HISTORY :  Past Medical History  Diagnosis Date  . ABNORMAL FINDINGS GI TRACT 09/12/2008  . ABNORMAL TRANSAMINASE-LFT'S 09/12/2008  . Acute bronchitis 07/21/2008  . Cirrhosis of liver without mention of alcohol 08/11/2008    sees Dr. Yancey FlemingsJohn Perry  . DEPRESSIVE DISORDER, RCR, MODERATE 07/09/2007  . DIABETES MELLITUS, TYPE II 07/09/2007  . GERD 09/07/2008  . Headache(784.0) 09/24/2010   . HIATAL HERNIA 09/07/2008  . HYPERTENSION 07/09/2007  . LOW BACK PAIN 07/02/2010  . Overweight(278.02) 10/01/2007  . 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 Seizures   . Anemia   . Pancytopenia     sees Dr. Eli HoseFiras Shadad   . Thrombocytopenia, unspecified 04/19/2013  . Anemia, unspecified 04/19/2013   Past Surgical History  Procedure Laterality Date  . Tubal ligation    . Carpal tunnel release    . Lumbar laminectomy      L5-S1  dr. Marianne Sofiaappling   Prior to Admission medications   Medication Sig Start Date End Date Taking? Authorizing Provider  alprazolam Prudy Feeler(XANAX) 2 MG tablet Take 2 mg by mouth every other day.   Yes Historical Provider, MD  furosemide (LASIX) 40 MG tablet Take 1 tablet (40 mg total) by mouth 2 (two) times daily. 05/08/14  Yes Nelwyn SalisburyStephen A Fry, MD  ibuprofen (ADVIL,MOTRIN) 200 MG tablet Take 800 mg by mouth every 6 (six) hours as needed for moderate pain. for body aches.   Yes Historical Provider, MD  Lactulose 20 GM/30ML SOLN Take 30 mLs (20 g total) by mouth 2 (two) times daily. 05/08/14  Yes Nelwyn SalisburyStephen A Fry, MD  metFORMIN (GLUCOPHAGE) 1000 MG tablet Take 1 tablet (1,000 mg total) by mouth 2 (two) times daily. 05/08/14  Yes Nelwyn SalisburyStephen A Fry, MD  mometasone-formoterol [facility](DULERA) 200-5 MCG/ACT AERO Inhale 2 puffs into the lungs 2 (two) times daily. 03/27/14  Yes Nelwyn SalisburyStephen A Fry, MD  Oxycodone HCl 20 MG TABS Take 1 tablet (20 mg total) by mouth every 6 (  six) hours as needed (pain). For pain 05/08/14  Yes [name], MD  potassium chloride SA (K-DUR,KLOR-CON) 20 MEQ tablet Take 20 mEq by mouth 2 (two) times daily.   Yes Historical Provider, MD  promethazine (PHENERGAN) 25 MG tablet Take 1 tablet (25 mg total) by mouth every 4 (four) hours as needed for nausea or vomiting. 05/05/14  Yes [name], MD  albuterol (PROVENTIL HFA;VENTOLIN HFA) 108 (90 BASE) MCG/ACT inhaler Inhale 2 puffs into the lungs every 4 (four) hours as needed for wheezing or shortness of breath. 05/02/13   [name], MD   Allergies   Allergen Reactions  . Aspirin Other (See Comments)    Causes elevated liver enzymes  . Phenergan [Promethazine Hcl] Other (See Comments)    Muscle spasms  . Tylenol [Acetaminophen]     Caused elevated liver enzymes    FAMILY HISTORY:  Family History  Problem Relation Age of Onset  . Diabetes Neg Hx     family hx , 1st degree relative  . Hypertension Neg Hx     famliy  . Cancer Neg Hx     family hx - lung , breat, pancreatic ,ovarian   SOCIAL HISTORY:  reports that she has been smoking Cigarettes.  She has been smoking about 0.00 packs per day. She has never used smokeless tobacco. She reports that she does not drink alcohol or use illicit drugs.  REVIEW OF SYSTEMS: NA  SUBJECTIVE:   VITAL SIGNS: Temp:  [97.8 F (36.6 C)] 97.8 F (36.6 C) (06/22 0440) Pulse Rate:  [87-124] 124 (06/22 0815) Resp:  [4-20] 18 (06/22 0815) BP: (127-156)/(64-86) 156/73 mmHg (06/22 0815) SpO2:  [99 %-100 %] 100 % (06/22 0815) FiO2 (%):  [100 %] 100 % (06/22 0736) HEMODYNAMICS:   VENTILATOR SETTINGS: Vent Mode:  [-] PRVC FiO2 (%):  [100 %] 100 % Set Rate:  [14 bmp] 14 bmp Vt Set:  [470 mL] 470 mL PEEP:  [5 cmH20] 5 cmH20 Plateau Pressure:  [19 cmH20] 19 cmH20 INTAKE / OUTPUT: Intake/Output     06/21 0701 - 06/22 0700 06/22 0701 - 06/23 0700   Blood  284   Total Intake   284   Net   +284          PHYSICAL EXAMINATION: General: sedated, NAD Neuro: depressed LOC, MAEs, no focal deficits noted HEENT: mild sclericterus, otherwise WNL Cardiovascular: RRR s M Lungs: Clear anteriorly Abdomen: distended, dull, + shifting dullness Ext: symmetric LE edema, multiple ecchymoses Skin: multiple telangiectasias on chest, arms  LABS:  CBC  Recent Labs Lab 05/22/14 0442 05/22/14 0522  WBC 6.5  --   HGB 8.5* 12.6  HCT 26.5* 37.0  PLT 74*  --    Coag's  Recent Labs Lab 05/22/14 0442  INR 1.71*   BMET  Recent Labs Lab 05/22/14 0442 05/22/14 0522  NA 135* 138  K 3.2* 3.2*   CL 93* 99  CO2 18*  --   BUN 45* 41*  CREATININE 4.08* 4.40*  GLUCOSE 95 92   Electrolytes  Recent Labs Lab 05/22/14 0442  CALCIUM 6.8*   Sepsis Markers  Recent Labs Lab 05/22/14 0503  LATICACIDVEN 3.77*   ABG No results found for this basename: PHART, PCO2ART, PO2ART,  in the last 168 hours Liver Enzymes  Recent Labs Lab 05/22/14 0442  AST 78*  ALT 25  ALKPHOS 224*  BILITOT 3.1*  ALBUMIN 2.9*   Cardiac Enzymes No results found for this basename: TROPONINI, PROBNP,  in the last 168 hours Glucose  Recent Labs Lab 05/22/14 0816  GLUCAP 98     CXR: No acute disease     ASSESSMENT / PLAN:  PULMONARY A: Acute resp failure due to altered mental status  P:   Cont full vent support - settings reviewed and/or adjusted Cont vent bundle Daily SBT if/when meets criteria  CARDIOVASCULAR A:  H/O HTN P:  Hold antihypertensives Monitor BP and rhythm  RENAL A:  Acute renal failure (baseline Cr 0.9) - relative hypovolemia vs hepatorenal Metabolic acidosis, elevated lactate due to impaired clearance Hypokalemia P:   Monitor BMET intermittently Monitor I/Os Correct electrolytes as indicated Maintain MAP > 70 mmHg. PRN NE ordered Maintain CVP > 10. PRN albumin ordered Cautious repletion of K+ in setting of renal insuff  GASTROINTESTINAL A:  ESLD, cirrhosis - etiology unclear (previous eval by GI 2009 - thought to be alcoholic. Family denies prior alcoholism) UGIB due esophageal variceal bleeding S/P EGD, variceal banding P:   SUP: PPI Octreotide infusion GI following - therapeutic paracentesis recommended Once stabilized and moving towards discharge, need to address impending need for liver transplantation    HEMATOLOGIC A:   Acute blood loss anemia Coagulopathic (secondary to liver disease)  Thrombocytopenia P:  DVT px: SCDs If actively bleeding, transfuse FFP to maintain INR < 1.5 If actively bleeding, transfuse plts to maintain > 75 -  100 K If actively bleeding, transfuse PRBCs to maintain Hgb > 8.5 and hemodynamic stability  Monitor CBC q 6 hrs X 24 hrs, then daily Vitamin K ordered  INFECTIOUS A:   At risk for SBP in setting of variceal bleeding P:   Micro and abx as above  ENDOCRINE A:  H/O DM II Risk of hypoglycemia in setting of renal and hepatic failure P:   Monitor CBG q 4 hrs Initiate SSI for glu > 180  NEUROLOGIC A:   Hepatic encephalopathy History of prior seizure though due to alprazolam withdrawal Chronic alprazolam use  P:   RASS goal: -1 Scheduled Lactulose ordered Daily WUA  TODAY'S SUMMARY:    Brett CanalesSteve Minor ACNP [facility] PCCM Pager [phone] till 3 pm If no answer page [phone] 05/22/2014, 9:01 AM   I have personally obtained a history, examined the patient, evaluated laboratory and imaging results, formulated the assessment and plan and placed orders. CRITICAL CARE: The patient is critically ill with multiple organ systems failure and requires high complexity decision making for assessment and support, frequent evaluation and titration of therapies, application of advanced monitoring technologies and extensive interpretation of multiple databases. Critical Care Time devoted to patient care services described in this note is 60 minutes.   Merwyn Katosavid B Simonds, MD Pulmonary and Critical Care Medicine [facility] Mobile [phone] Pager: [phone]  05/22/2014, 8:29 AM

## 2014-05-22 NOTE — Op Note (Signed)
Dana Petersen, Dana Petersen   OPERATIVE PROCEDURE REPORT  PATIENT: [name], [name].  MR#: [id number] BIRTHDATE: [birth year]  GENDER: Female ENDOSCOPIST: Jeani HawkingPatrick Julizza Sassone, MD ASSISTANT:   Olene CravenJacqueline Aiken, technician and Nada LibmanKatha Henderson, RN PROCEDURE DATE: 05/22/2014 PROCEDURE:   EGD with band ligation ASA CLASS:   Class IV INDICATIONS:Esophageal variceal bleed. MEDICATIONS: Versed 7 mg IV and Fentanyl 75 mcg IV TOPICAL ANESTHETIC:   none  DESCRIPTION OF PROCEDURE:   After the risks benefits and alternatives of the procedure were thoroughly explained, informed consent was obtained.  The Pentax Gastroscope [id number]  endoscope was introduced through the mouth  and advanced to the second portion of the duodenum Without limitations.      The instrument was slowly withdrawn as the mucosa was fully examined.    FINDINGS: Upon initial entry the esopahgus displayed large mid to distal esophageal varices.  No red wale signs or any overt source of bleeding.  Seven out of seven bands were successfully deployed onto the varices.  The gastric lumen was significant for portal HTN gastropathy, but no ulcerations, erosions, or vascular abnormalities.  No evidence of any fundic varices.  The duodenum was also normal.   Retroflexed views revealed no abnormalities. The scope was then withdrawn from the patient and the procedure terminated.  COMPLICATIONS: There were no complications. IMPRESSION: 1) Large mid to distal esophageal varices s/p banding. 2) Portal HTN gastropathy.  RECOMMENDATIONS: 1) Continue with octreotide. 2) Continue with ceftriaxone. 3) Add on Xifaxan when she can tolerate PO. 4) Therapeutic paracentesis. 5) Follow HGB and keep the HGB between 7-10 g/dL.  _______________________________ eSignedJeani Hawking:  Alvar Malinoski, MD 05/22/2014 1:29 PM

## 2014-05-22 NOTE — ED Notes (Signed)
Critical care at bedside  

## 2014-05-22 NOTE — ED Notes (Signed)
Vital signs stable. 

## 2014-05-22 NOTE — ED Notes (Addendum)
Pt verbalized "can you get me a blanket", 3rd IV site established, respirations improved from 5 to 14 when HOB lowered to 15 degrees (d/t distended abd), protonix given, pt to CT.

## 2014-05-22 NOTE — ED Provider Notes (Signed)
CSN: [id number]     Arrival date & time 05/22/14  0429 History   First MD Initiated Contact with Patient 05/22/14 [phone]     Chief Complaint  Patient presents with  . GI Bleeding  . Emesis  . Altered Mental Status  . Cirrhosis     (Consider location/radiation/quality/duration/timing/severity/associated sxs/prior Treatment) HPI Comments: 48 year old female this history of liver cirrhosis, high blood pressure, seizures, thrombocytopenia and anemia presents with hematemesis that woke her from sleep early this morning. No history of similar. Patient currently does not have a gastroenterologist. Patient normally goes [facility] plus unknown details of last EGD etc. Husband unsure details of medical history in terms of varices or ulcers. Patient is not on blood thinners. History is from the husband as patient is lethargic on arrival. Husband reports patient generally has not followed past couple days and that this vomiting of blood is new for her. Patient has had gradually worsening distention in her abdomen, amount weight gain and leg swelling is acute on chronic.  Patient is a 48 y.o. female presenting with vomiting and altered mental status. The history is provided by the spouse and medical records.  Emesis Altered Mental Status Associated symptoms: vomiting     Past Medical History  Diagnosis Date  . ABNORMAL FINDINGS GI TRACT 09/12/2008  . ABNORMAL TRANSAMINASE-LFT'S 09/12/2008  . Acute bronchitis 07/21/2008  . Cirrhosis of liver without mention of alcohol 08/11/2008    sees Dr. Yancey Flemings  . DEPRESSIVE DISORDER, RCR, MODERATE 07/09/2007  . DIABETES MELLITUS, TYPE II 07/09/2007  . GERD 09/07/2008  . Headache(784.0) 09/24/2010  . HIATAL HERNIA 09/07/2008  . HYPERTENSION 07/09/2007  . LOW BACK PAIN 07/02/2010  . Overweight(278.02) 10/01/2007  . 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 Seizures   . Anemia   . Pancytopenia     sees Dr. Eli Hose   . Thrombocytopenia, unspecified  04/19/2013  . Anemia, unspecified 04/19/2013   Past Surgical History  Procedure Laterality Date  . Tubal ligation    . Carpal tunnel release    . Lumbar laminectomy      L5-S1  dr. Marianne Sofia   Family History  Problem Relation Age of Onset  . Diabetes Neg Hx     family hx , 1st degree relative  . Hypertension Neg Hx     famliy  . Cancer Neg Hx     family hx - lung , breat, pancreatic ,ovarian   History  Substance Use Topics  . Smoking status: Current Every Day Smoker    Types: Cigarettes  . Smokeless tobacco: Never Used     Comment: trying to quit  . Alcohol Use: No   OB History   Grav Para Term Preterm Abortions TAB SAB Ect Mult Living                 Review of Systems  Unable to perform ROS: Mental status change  Gastrointestinal: Positive for vomiting.      Allergies  Aspirin; Phenergan; and Tylenol  Home Medications   Prior to Admission medications   Medication Sig Start Date End Date Taking? Authorizing Provider  alprazolam Prudy Feeler) 2 MG tablet Take 2 mg by mouth every other day.   Yes Historical Provider, MD  furosemide (LASIX) 40 MG tablet Take 1 tablet (40 mg total) by mouth 2 (two) times daily. 05/08/14  Yes [name], MD  ibuprofen (ADVIL,MOTRIN) 200 MG tablet Take 800 mg by mouth every 6 (six) hours as needed for moderate  pain. for body aches.   Yes Historical Provider, MD  Lactulose 20 GM/30ML SOLN Take 30 mLs (20 g total) by mouth 2 (two) times daily. 05/08/14  Yes Nelwyn SalisburyStephen A Fry, MD  metFORMIN (GLUCOPHAGE) 1000 MG tablet Take 1 tablet (1,000 mg total) by mouth 2 (two) times daily. 05/08/14  Yes Nelwyn SalisburyStephen A Fry, MD  mometasone-formoterol [facility](DULERA) 200-5 MCG/ACT AERO Inhale 2 puffs into the lungs 2 (two) times daily. 03/27/14  Yes Nelwyn SalisburyStephen A Fry, MD  Oxycodone HCl 20 MG TABS Take 1 tablet (20 mg total) by mouth every 6 (six) hours as needed (pain). For pain 05/08/14  Yes Nelwyn SalisburyStephen A Fry, MD  potassium chloride SA (K-DUR,KLOR-CON) 20 MEQ tablet Take 20 mEq by mouth 2  (two) times daily.   Yes Historical Provider, MD  promethazine (PHENERGAN) 25 MG tablet Take 1 tablet (25 mg total) by mouth every 4 (four) hours as needed for nausea or vomiting. 05/05/14  Yes Nelwyn SalisburyStephen A Fry, MD  albuterol (PROVENTIL HFA;VENTOLIN HFA) 108 (90 BASE) MCG/ACT inhaler Inhale 2 puffs into the lungs every 4 (four) hours as needed for wheezing or shortness of breath. 05/02/13   Nelwyn SalisburyStephen A Fry, MD   BP 128/72  Pulse 90  Temp(Src) 97.8 F (36.6 C) (Oral)  Resp 8  SpO2 100% Physical Exam  Nursing note and vitals reviewed. Constitutional: She appears well-developed and well-nourished. She appears lethargic.  HENT:  Head: Normocephalic and atraumatic.  Dry mucous membranes  Eyes: Right eye exhibits no discharge. Left eye exhibits no discharge.  Neck: Normal range of motion. Neck supple. No tracheal deviation present.  Cardiovascular: Normal rate and regular rhythm.   Pulmonary/Chest: Breath sounds normal.  Decreased respiratory effort  Abdominal: Soft. She exhibits no distension. There is no tenderness. There is no guarding.  Abdomen distended with  ascites  Musculoskeletal: She exhibits edema (3+ edema bilateral lower extremities mild pitting).  Neurological: She appears lethargic. GCS eye subscore is 3. GCS verbal subscore is 2. GCS motor subscore is 4.  Patient lethargic, opens her eyes to loud verbal stimulation. General weakness with minimal movement equal bilateral. Patient does respond to pain. Neck supple. Pupils equal bilateral  Skin: Skin is warm.  Pale/jaundice Multiple areas of ecchymosis on extremities and left upper anterior chest wall  Psychiatric:  Lethargy    ED Course  Procedures (including critical care time) INTUBATION Performed by: Enid SkeensZAVITZ, Meris Reede M  Required items: required blood products, implants, devices, and special equipment available Patient identity confirmed: provided demographic data and hospital-assigned identification number Time out:  Immediately prior to procedure a "time out" was called to verify the correct patient, procedure, equipment, support staff.  Indications: lethargy, hematemesis Intubation method: direct Preoxygenation: [place] Sedatives: Etomidate Paralytic: rocuronium Tube Size: 7.5 cuffed  Post-procedure assessment: chest rise and ETCO2 monitor Breath sounds: equal and absent over the epigastrium Tube secured with: ETT holder Chest x-ray interpreted by me.  Chest x-ray findings: endotracheal tube adjusted 3 cm  Patient tolerated the procedure well with no immediate complications.  CRITICAL CARE Performed by: Enid SkeensZAVITZ, Pleasant Britz M Total critical care time: 70 min  Critical care time was exclusive of separately billable procedures and treating other patients.  Critical care was necessary to treat or prevent imminent or life-threatening deterioration.  Critical care was time spent personally by me on the following activities: development of treatment plan with patient and/or surrogate as well as nursing, discussions with consultants, evaluation of patient's response to treatment, examination of patient, obtaining history from patient or surrogate,  ordering and performing treatments and interventions, ordering and review of laboratory studies, ordering and review of radiographic studies, pulse oximetry and re-evaluation of patient's condition.  Labs Review Labs Reviewed  COMPREHENSIVE METABOLIC PANEL - Abnormal; Notable for the following:    Sodium 135 (*)    Potassium 3.2 (*)    Chloride 93 (*)    CO2 18 (*)    BUN 45 (*)    Creatinine, Ser 4.08 (*)    Calcium 6.8 (*)    Albumin 2.9 (*)    AST 78 (*)    Alkaline Phosphatase 224 (*)    Total Bilirubin 3.1 (*)    GFR calc non Af Amer 12 (*)    GFR calc Af Amer 14 (*)    All other components within normal limits  CBC WITH DIFFERENTIAL - Abnormal; Notable for the following:    RBC 2.92 (*)    Hemoglobin 8.5 (*)    HCT 26.5 (*)    RDW 21.4 (*)     Platelets 74 (*)    Eosinophils Relative 9 (*)    All other components within normal limits  AMMONIA - Abnormal; Notable for the following:    Ammonia 177 (*)    All other components within normal limits  PROTIME-INR - Abnormal; Notable for the following:    Prothrombin Time 19.6 (*)    INR 1.71 (*)    All other components within normal limits  URINALYSIS, ROUTINE W REFLEX MICROSCOPIC - Abnormal; Notable for the following:    Color, Urine AMBER (*)    APPearance CLOUDY (*)    Bilirubin Urine SMALL (*)    Leukocytes, UA SMALL (*)    All other components within normal limits  URINE MICROSCOPIC-ADD ON - Abnormal; Notable for the following:    Casts HYALINE CASTS (*)    All other components within normal limits  I-STAT CG4 LACTIC ACID, ED - Abnormal; Notable for the following:    Lactic Acid, Venous 3.77 (*)    All other components within normal limits  I-STAT CHEM 8, ED - Abnormal; Notable for the following:    Potassium 3.2 (*)    BUN 41 (*)    Creatinine, Ser 4.40 (*)    Calcium, Ion 0.78 (*)    All other components within normal limits  OCCULT BLOOD X 1 CARD TO LAB, STOOL  HEMOGLOBIN AND HEMATOCRIT, BLOOD  POC OCCULT BLOOD, ED  POCT GASTRIC OCCULT BLOOD (1-CARD TO LAB)  SAMPLE TO BLOOD BANK  TYPE AND SCREEN  PREPARE RBC (CROSSMATCH)  ABO/RH  PREPARE RBC (CROSSMATCH)  PREPARE FRESH FROZEN PLASMA    Imaging Review Ct Head Wo Contrast  05/22/2014   CLINICAL DATA:  Altered mental status, cirrhosis and GI bleed.  EXAM: CT HEAD WITHOUT CONTRAST  TECHNIQUE: Contiguous axial images were obtained from the base of the skull through the vertex without intravenous contrast.  COMPARISON:  CT of the head Apr 18, 2014  FINDINGS: The ventricles and sulci are normal. No intraparenchymal hemorrhage, mass effect nor midline shift. No acute large vascular territory infarcts.  No abnormal extra-axial fluid collections. Basal cisterns are patent.  No skull fracture. The included ocular globes and  orbital contents are non-suspicious. The mastoid aircells are well-aerated. Worsening right ethmoid, frontal and maxillary sinusitis. Patient is edentulous.  IMPRESSION: No acute intracranial process.  Worsening right paranasal sinusitis.   Electronically Signed   By: Awilda Metroourtnay  Bloomer   On: 05/22/2014 06:34   Dg Chest Port 1 [address]View  05/22/2014   CLINICAL DATA:  Altered mental status.  Vomiting blood.  EXAM: PORTABLE CHEST - 1 VIEW  COMPARISON:  03/1914  FINDINGS: Shallow inspiration. Normal heart size and pulmonary vascularity. Lungs appear clear and expanded given degree of inspiration. No focal airspace disease. No blunting of costophrenic angles. Similar appearance to prior study.  IMPRESSION: Shallow inspiration.  No evidence of active pulmonary disease.   Electronically Signed   By: Burman Nieves M.D.   On: 05/22/2014 06:03     EKG Interpretation   Date/Time:  Monday May 22 2014 05:00:05 EDT Ventricular Rate:  94 PR Interval:  163 QRS Duration: 88 QT Interval:  419 QTC Calculation: 524 R Axis:   40 Text Interpretation:  Sinus rhythm Nonspecific T abnrm, anterolateral  leads Prolonged QT interval Confirmed by Jodi Mourning  MD, Inessa Wardrop ([id number]) on  05/22/2014 7:15:59 AM      MDM   Final diagnoses:  Upper GI bleed  Cirrhosis of liver with ascites, unspecified hepatic cirrhosis type  Acute hepatic encephalopathy  Ascites  Hepatorenal syndrome  Blood loss anemia  Elevated INR  Hypokalemia  Lactic acidosis   Patient presents with altered mental status and hematemesis. Clinically patient lethargic and minimal response mostly the pain or very loud verbal stimulation and with history of cirrhosis concern for acute encephalopathy hepatic origin and possible varices/ulcer as etiology of hematemesis. 3 peripheral IVs placed in the ED, blood transfusion ordered. Zofran, protonic, Rocephin, octreotide ordered for possible varices bleed. On recheck patient having worsening lethargy and decreased  respiratory effort. I discussed the case with gastroenterology on call who will evaluate and assist with treatment. GI recommended 2 units of FFP stat which I ordered to plan for endoscopy. Blood work showed acute renal failure thus concern for hepatic renal syndrome. Patient continued to vomit blood despite Zofran, repeat Zofran ordered and discussion with significant other in terms of the critical nature of the patient and plan for intubation and admission to the ICU.  Patient intubated without difficulty, respiratory therapy assisting.  To discuss the case with critical care physician on-call and plan for admission.  Repeat hemoglobin sent  The patients results and plan were reviewed and discussed.   Any x-rays performed were personally reviewed by myself.   Differential diagnosis were considered with the presenting HPI.  Medications  octreotide (SANDOSTATIN) 2 mcg/mL in sodium chloride 0.9 % 250 mL infusion (not administered)  lactulose (CHRONULAC) 10 GM/15ML solution 20 g (not administered)  lidocaine (cardiac) 100 mg/69ml (XYLOCAINE) 20 MG/ML injection 2% (  Not Given 05/22/14 0727)  succinylcholine (ANECTINE) 20 MG/ML injection (  Not Given 05/22/14 0727)  midazolam (VERSED) 1 mg/mL in sodium chloride 0.9 % 50 mL infusion (not administered)  ondansetron (ZOFRAN) injection 4 mg (4 mg Intravenous Given 05/22/14 0502)  cefTRIAXone (ROCEPHIN) 1 g in dextrose 5 % 50 mL IVPB (0 g Intravenous Stopped 05/22/14 0726)  octreotide (SANDOSTATIN) injection 50 mcg (50 mcg Intravenous Given 05/22/14 0628)  pantoprazole (PROTONIX) injection 40 mg (40 mg Intravenous Given 05/22/14 0610)  pantoprazole (PROTONIX) injection 40 mg (40 mg Intravenous Given 05/22/14 0636)  ondansetron (ZOFRAN) injection 4 mg (4 mg Intravenous Given 05/22/14 0640)  rocuronium (ZEMURON) injection 70 mg (70 mg Intravenous Given 05/22/14 0705)  etomidate (AMIDATE) injection 20 mg (20 mg Intravenous Given 05/22/14 0704)    Filed  Vitals:   05/22/14 0600 05/22/14 0645 05/22/14 0650 05/22/14 0700  BP: 139/86 134/67 137/67 128/72  Pulse: 95 92 90   Temp:  TempSrc:      Resp: 14 7 19 8   SpO2: 100% 99% 100%     Admission/ observation were discussed with the admitting physician, patient and/or family and they are comfortable with the plan.     Enid Skeens, MD 05/22/14 [phone]

## 2014-05-22 NOTE — Consult Note (Signed)
Consult for [facility]  Reason for Consult: Probable esophageal variceal bleed Referring Physician: [facility]  Bing Matter HPI: This is a 48 year old female with a PMH of cirrhosis, LA Grade A esophagitis, GERD, and DM who presents to the hospital with hematemesis.  The patient cannot speak at this time as she is intubated for airway protection.  Additionally, there was the report of AMS.  She has a history of hepatic encephalopathy and she was recently evaluated at [facility].  In 2009 she was diagnosed with cirrhosis and it was presumed that her cirrhosis was from ETOH and/or NASH.  No evidence of any viral hepatidites.  Dr. Marina Goodell evaluated her in 2009 with a screening EGD and there was no evidence of any esophageal varices, but she had an LA Grade A esophagitis.  The last time she saw Dr. Marina Goodell was in 2012 and since then she only sees her PCP Dr. Clent Ridges.    Past Medical History  Diagnosis Date  . ABNORMAL FINDINGS GI TRACT 09/12/2008  . ABNORMAL TRANSAMINASE-LFT'S 09/12/2008  . Acute bronchitis 07/21/2008  . Cirrhosis of liver without mention of alcohol 08/11/2008    sees Dr. Yancey Flemings  . DEPRESSIVE DISORDER, RCR, MODERATE 07/09/2007  . DIABETES MELLITUS, TYPE II 07/09/2007  . GERD 09/07/2008  . Headache(784.0) 09/24/2010  . HIATAL HERNIA 09/07/2008  . HYPERTENSION 07/09/2007  . LOW BACK PAIN 07/02/2010  . Overweight(278.02) 10/01/2007  . 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 Seizures   . Anemia   . Pancytopenia     sees Dr. Eli Hose   . Thrombocytopenia, unspecified 04/19/2013  . Anemia, unspecified 04/19/2013    Past Surgical History  Procedure Laterality Date  . Tubal ligation    . Carpal tunnel release    . Lumbar laminectomy      L5-S1  dr. Marianne Sofia    Family History  Problem Relation Age of Onset  . Diabetes Neg Hx     family hx , 1st degree relative  . Hypertension Neg Hx     famliy  . Cancer Neg Hx     family hx - lung , breat, pancreatic  ,ovarian    Social History:  reports that she has been smoking Cigarettes.  She has been smoking about 0.00 packs per day. She has never used smokeless tobacco. She reports that she does not drink alcohol or use illicit drugs.  Allergies:  Allergies  Allergen Reactions  . Aspirin Other (See Comments)    Causes elevated liver enzymes  . Phenergan [Promethazine Hcl] Other (See Comments)    Muscle spasms  . Tylenol [Acetaminophen]     Caused elevated liver enzymes    Medications:  Scheduled: . cefTRIAXone (ROCEPHIN)  IV  2 g Intravenous Q24H  . fentaNYL  50 mcg Intravenous Once  . lidocaine (cardiac) 100 mg/43ml      . [START ON 05/25/2014] pantoprazole (PROTONIX) IV  40 mg Intravenous Q12H  . succinylcholine       Continuous: . sodium chloride 100 mL/hr at 05/22/14 1132  . fentaNYL infusion INTRAVENOUS 100 mcg/hr (05/22/14 1136)  . midazolam (VERSED) infusion    . norepinephrine (LEVOPHED) Adult infusion    . octreotide (SANDOSTATIN) infusion 25 mcg/hr (05/22/14 1104)  . pantoprozole (PROTONIX) infusion 8 mg/hr (05/22/14 1201)    Results for orders placed during the hospital encounter of 05/22/14 (from the past 24 hour(s))  TYPE AND SCREEN     Status: None   Collection Time  05/22/14  4:25 AM      Result Value Ref Range   ABO/RH(D) O POS     Antibody Screen NEG     Sample Expiration 05/25/2014     Unit Number [id number]     Blood Component Type RBC LR PHER1     Unit division 00     Status of Unit ALLOCATED     Transfusion Status OK TO TRANSFUSE     Crossmatch Result Compatible     Unit Number [id number]     Blood Component Type RBC LR PHER2     Unit division 00     Status of Unit ISSUED     Transfusion Status OK TO TRANSFUSE     Crossmatch Result Compatible    ABO/RH     Status: None   Collection Time    05/22/14  4:25 AM      Result Value Ref Range   ABO/RH(D) O POS    COMPREHENSIVE METABOLIC PANEL     Status: Abnormal   Collection Time    05/22/14   4:42 AM      Result Value Ref Range   Sodium 135 (*) 137 - 147 mEq/L   Potassium 3.2 (*) 3.7 - 5.3 mEq/L   Chloride 93 (*) 96 - 112 mEq/L   CO2 18 (*) 19 - 32 mEq/L   Glucose, Bld 95  70 - 99 mg/dL   BUN 45 (*) 6 - 23 mg/dL   Creatinine, Ser 3.084.08 (*) 0.50 - 1.10 mg/dL   Calcium 6.8 (*) 8.4 - 10.5 mg/dL   Total Protein 7.8  6.0 - 8.3 g/dL   Albumin 2.9 (*) 3.5 - 5.2 g/dL   AST 78 (*) 0 - 37 U/L   ALT 25  0 - 35 U/L   Alkaline Phosphatase 224 (*) 39 - 117 U/L   Total Bilirubin 3.1 (*) 0.3 - 1.2 mg/dL   GFR calc non Af Amer 12 (*) >90 mL/min   GFR calc Af Amer 14 (*) >90 mL/min  CBC WITH DIFFERENTIAL     Status: Abnormal   Collection Time    05/22/14  4:42 AM      Result Value Ref Range   WBC 6.5  4.0 - 10.5 K/uL   RBC 2.92 (*) 3.87 - 5.11 MIL/uL   Hemoglobin 8.5 (*) 12.0 - 15.0 g/dL   HCT 65.726.5 (*) 84.636.0 - 96.246.0 %   MCV 90.8  78.0 - 100.0 fL   MCH 29.1  26.0 - 34.0 pg   MCHC 32.1  30.0 - 36.0 g/dL   RDW 95.221.4 (*) 84.111.5 - 32.415.5 %   Platelets 74 (*) 150 - 400 K/uL   Neutrophils Relative % 61  43 - 77 %   Neutro Abs 4.0  1.7 - 7.7 K/uL   Lymphocytes Relative 22  12 - 46 %   Lymphs Abs 1.5  0.7 - 4.0 K/uL   Monocytes Relative 8  3 - 12 %   Monocytes Absolute 0.5  0.1 - 1.0 K/uL   Eosinophils Relative 9 (*) 0 - 5 %   Eosinophils Absolute 0.6  0.0 - 0.7 K/uL   Basophils Relative 1  0 - 1 %   Basophils Absolute 0.0  0.0 - 0.1 K/uL  PROTIME-INR     Status: Abnormal   Collection Time    05/22/14  4:42 AM      Result Value Ref Range   Prothrombin Time 19.6 (*) 11.6 - 15.2 seconds  INR 1.71 (*) 0.00 - 1.49  SAMPLE TO BLOOD BANK     Status: None   Collection Time    05/22/14  4:48 AM      Result Value Ref Range   Blood Bank Specimen SAMPLE AVAILABLE FOR TESTING     Sample Expiration 05/23/2014    I-STAT CG4 LACTIC ACID, ED     Status: Abnormal   Collection Time    05/22/14  5:03 AM      Result Value Ref Range   Lactic Acid, Venous 3.77 (*) 0.5 - 2.2 mmol/L  AMMONIA     Status:  Abnormal   Collection Time    05/22/14  5:21 AM      Result Value Ref Range   Ammonia 177 (*) 11 - 60 umol/L  I-STAT CHEM 8, ED     Status: Abnormal   Collection Time    05/22/14  5:22 AM      Result Value Ref Range   Sodium 138  137 - 147 mEq/L   Potassium 3.2 (*) 3.7 - 5.3 mEq/L   Chloride 99  96 - 112 mEq/L   BUN 41 (*) 6 - 23 mg/dL   Creatinine, Ser 7.82 (*) 0.50 - 1.10 mg/dL   Glucose, Bld 92  70 - 99 mg/dL   Calcium, Ion 9.56 (*) 1.12 - 1.23 mmol/L   TCO2 19  0 - 100 mmol/L   Hemoglobin 12.6  12.0 - 15.0 g/dL   HCT 21.3  08.6 - 57.8 %  POC OCCULT BLOOD, ED     Status: None   Collection Time    05/22/14  5:40 AM      Result Value Ref Range   Fecal Occult Bld NEGATIVE  NEGATIVE  PREPARE RBC (CROSSMATCH)     Status: None   Collection Time    05/22/14  6:00 AM      Result Value Ref Range   Order Confirmation ORDER PROCESSED BY BLOOD BANK    PREPARE RBC (CROSSMATCH)     Status: None   Collection Time    05/22/14  6:01 AM      Result Value Ref Range   Order Confirmation ORDER PROCESSED BY BLOOD BANK    PREPARE FRESH FROZEN PLASMA     Status: None   Collection Time    05/22/14  6:02 AM      Result Value Ref Range   Unit Number [id number]     Blood Component Type THAWED PLASMA     Unit division 00     Status of Unit ISSUED     Transfusion Status OK TO TRANSFUSE     Unit Number [id number]     Blood Component Type THAWED PLASMA     Unit division 00     Status of Unit ALLOCATED     Transfusion Status OK TO TRANSFUSE    URINALYSIS, ROUTINE W REFLEX MICROSCOPIC     Status: Abnormal   Collection Time    05/22/14  6:38 AM      Result Value Ref Range   Color, Urine AMBER (*) YELLOW   APPearance CLOUDY (*) CLEAR   Specific Gravity, Urine 1.018  1.005 - 1.030   pH 5.5  5.0 - 8.0   Glucose, UA NEGATIVE  NEGATIVE mg/dL   Hgb urine dipstick NEGATIVE  NEGATIVE   Bilirubin Urine SMALL (*) NEGATIVE   Ketones, ur NEGATIVE  NEGATIVE mg/dL   Protein, ur NEGATIVE  NEGATIVE  mg/dL   Urobilinogen, UA 1.0  0.0 - 1.0 mg/dL   Nitrite NEGATIVE  NEGATIVE   Leukocytes, UA SMALL (*) NEGATIVE  URINE MICROSCOPIC-ADD ON     Status: Abnormal   Collection Time    05/22/14  6:38 AM      Result Value Ref Range   Squamous Epithelial / LPF RARE  RARE   WBC, UA 0-2  <3 WBC/hpf   RBC / HPF 0-2  <3 RBC/hpf   Bacteria, UA RARE  RARE   Casts HYALINE CASTS (*) NEGATIVE  CBG MONITORING, ED     Status: None   Collection Time    05/22/14  8:16 AM      Result Value Ref Range   Glucose-Capillary 98  70 - 99 mg/dL  I-STAT ARTERIAL BLOOD GAS, ED     Status: Abnormal   Collection Time    05/22/14  8:59 AM      Result Value Ref Range   pH, Arterial 7.385  7.350 - 7.450   pCO2 arterial 38.1  35.0 - 45.0 mmHg   pO2, Arterial 550.0 (*) 80.0 - 100.0 mmHg   Bicarbonate 22.8  20.0 - 24.0 mEq/L   TCO2 24  0 - 100 mmol/L   O2 Saturation 100.0     Acid-base deficit 2.0  0.0 - 2.0 mmol/L   Patient temperature 98.6 F     Collection site RADIAL, ALLEN'S TEST ACCEPTABLE     Sample type ARTERIAL    MRSA PCR SCREENING     Status: Abnormal   Collection Time    05/22/14 11:22 AM      Result Value Ref Range   MRSA by PCR POSITIVE (*) NEGATIVE  PROCALCITONIN     Status: None   Collection Time    05/22/14 11:55 AM      Result Value Ref Range   Procalcitonin 0.30    URINALYSIS, ROUTINE W REFLEX MICROSCOPIC     Status: Abnormal   Collection Time    05/22/14 12:35 PM      Result Value Ref Range   Color, Urine YELLOW  YELLOW   APPearance CLEAR  CLEAR   Specific Gravity, Urine 1.016  1.005 - 1.030   pH 5.5  5.0 - 8.0   Glucose, UA NEGATIVE  NEGATIVE mg/dL   Hgb urine dipstick MODERATE (*) NEGATIVE   Bilirubin Urine NEGATIVE  NEGATIVE   Ketones, ur NEGATIVE  NEGATIVE mg/dL   Protein, ur NEGATIVE  NEGATIVE mg/dL   Urobilinogen, UA 1.0  0.0 - 1.0 mg/dL   Nitrite NEGATIVE  NEGATIVE   Leukocytes, UA MODERATE (*) NEGATIVE  GLUCOSE, CAPILLARY     Status: Abnormal   Collection Time     05/22/14 12:35 PM      Result Value Ref Range   Glucose-Capillary 108 (*) 70 - 99 mg/dL  URINE MICROSCOPIC-ADD ON     Status: Abnormal   Collection Time    05/22/14 12:35 PM      Result Value Ref Range   Squamous Epithelial / LPF FEW (*) RARE   WBC, UA 3-6  <3 WBC/hpf   RBC / HPF 3-6  <3 RBC/hpf   Bacteria, UA FEW (*) RARE   Casts HYALINE CASTS (*) NEGATIVE   Crystals CA OXALATE CRYSTALS (*) NEGATIVE     Ct Head Wo Contrast  05/22/2014   CLINICAL DATA:  Altered mental status, cirrhosis and GI bleed.  EXAM: CT HEAD WITHOUT CONTRAST  TECHNIQUE: Contiguous axial images were obtained from the base of the skull through the vertex  without intravenous contrast.  COMPARISON:  CT of the head Apr 18, 2014  FINDINGS: The ventricles and sulci are normal. No intraparenchymal hemorrhage, mass effect nor midline shift. No acute large vascular territory infarcts.  No abnormal extra-axial fluid collections. Basal cisterns are patent.  No skull fracture. The included ocular globes and orbital contents are non-suspicious. The mastoid aircells are well-aerated. Worsening right ethmoid, frontal and maxillary sinusitis. Patient is edentulous.  IMPRESSION: No acute intracranial process.  Worsening right paranasal sinusitis.   Electronically Signed   By: Awilda Metro   On: 05/22/2014 06:34   Dg Chest Portable 1 View  05/22/2014   CLINICAL DATA:  GI bleed. Altered mental status. Check line placement.  EXAM: PORTABLE CHEST - 1 VIEW  COMPARISON:  05/22/2014  FINDINGS: Left central line is in place with the tip at the cavoatrial junction. Endotracheal tube has been retracted, now approximately 3 cm above the carina. NG tube is in the stomach.  Very low lung volumes with bibasilar atelectasis. Heart is normal size. No visible effusions or acute bony abnormality.  IMPRESSION: Left central line tip at the cavoatrial junction. No pneumothorax. Endotracheal tube 3 cm above the carina.  Low lung volumes, bibasilar  atelectasis.   Electronically Signed   By: Charlett Nose M.D.   On: 05/22/2014 10:37   Dg Chest Portable 1 View  05/22/2014   CLINICAL DATA:  Bronchitis  EXAM: PORTABLE CHEST - 1 VIEW  COMPARISON:  05/22/2014 545 hr  FINDINGS: Cardiac shadow is stable. The lungs are hypoinflated. The overall appearance is stable from the prior exam. A nasogastric catheter is been placed and coiled within the stomach. An endotracheal tube is now seen at the level of the carina directed towards the right mainstem bronchus and should be withdrawn 3 cm. No bony abnormality is noted.  IMPRESSION: Nasogastric catheter in satisfactory position.  The endotracheal tube at the level of the carina directed towards the right mainstem bronchus. This should be withdrawn 3 cm.  These results were called by telephone at the time of interpretation on 05/22/2014 at 7:49 AM to Dr. Blane Ohara , who verbally acknowledged these results.   Electronically Signed   By: Alcide Clever M.D.   On: 05/22/2014 07:49   Dg Chest Port 1 View  05/22/2014   CLINICAL DATA:  Altered mental status.  Vomiting blood.  EXAM: PORTABLE CHEST - 1 VIEW  COMPARISON:  03/1914  FINDINGS: Shallow inspiration. Normal heart size and pulmonary vascularity. Lungs appear clear and expanded given degree of inspiration. No focal airspace disease. No blunting of costophrenic angles. Similar appearance to prior study.  IMPRESSION: Shallow inspiration.  No evidence of active pulmonary disease.   Electronically Signed   By: Burman Nieves M.D.   On: 05/22/2014 06:03    ROS:  As stated above in the HPI otherwise negative.  Blood pressure 153/87, pulse 112, temperature 97.9 F (36.6 C), temperature source Oral, resp. rate 25, SpO2 100.00%.    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 3) Hepatic encephalopathy.   I performed the EGD and noted that she had large mid to distal esophageal varices.  No red wale signs or any overt evidence of bleeding.  Seven bands were successfully placed.  Plan: 1) Continue with Octreotide. 2) Continue with Ceftriaxone. 3) Add on Xifaxan  550 mg BID when she is able to tolerate PO. 4) Therapeutic paracentesis. 5) Keep HGB between 7-10 g/dL.   Samarah Hogle D 05/22/2014, 1:13 PM

## 2014-05-22 NOTE — Progress Notes (Signed)
Summary:  Upon arrival to bedside pt awake, nodding approp. To questions.Marland Kitchen.denied pain.  Explaination of procedure and reason given pt who indicated by head nod she understood..the patient on Fentynl gtt but requiring additional sedation per assessment, and then later per Dr. Elnoria HowardHung.  Pt tolerated procedure well.  Banding explained to her post procedure and she nodded understanding.  Report sign-off to [place], Charity fundraiserN. VSS throughout procedure.

## 2014-05-22 NOTE — ED Notes (Signed)
Dr. Zavitz into room.  

## 2014-05-22 NOTE — Progress Notes (Signed)
Withdrew ETT to 19cm (3cm)per Dr. Druscilla BrownieZavitv verbal order.

## 2014-05-22 NOTE — Progress Notes (Deleted)
CRITICAL VALUE ALERT  Critical value received:  Calcium 6.3  Date of notification:  05/22/14    Time of notification:  17:22  Critical value read back:yes  Nurse who received alert:  Alex Louisiana Searles RN  MD notified (1st page):  Dr. Wright  Time of first page:  17:22  MD notified (2nd page):  Time of second page:  Responding MD:  Dr. Wright  Time MD responded:  17:22 

## 2014-05-22 NOTE — ED Notes (Signed)
Family at bedside. 

## 2014-05-22 NOTE — ED Notes (Signed)
No changes. Pt resting/sleeping, stuporous, arousable to voice, pending imaging. [facility]. SPO2 100%. Continuing to monitor.

## 2014-05-22 NOTE — ED Notes (Signed)
MD at bedside. 

## 2014-05-22 NOTE — ED Notes (Signed)
Husband at BS

## 2014-05-22 NOTE — ED Notes (Signed)
Dr. Jodi MourningZavitz preparing to intubate.

## 2014-05-22 NOTE — ED Notes (Signed)
Xray at BS 

## 2014-05-22 NOTE — H&P (View-Only) (Signed)
ANTIBIOTIC CONSULT NOTE - INITIAL  Pharmacy Consult for Ceftriaxone Indication: empiric for SBP  Allergies  Allergen Reactions  . Aspirin Other (See Comments)    Causes elevated liver enzymes  . Phenergan [Promethazine Hcl] Other (See Comments)    Muscle spasms  . Tylenol [Acetaminophen]     Caused elevated liver enzymes    Patient Measurements:    Vital Signs: Temp: 97.8 F (36.6 C) (06/22 0440) Temp src: Oral (06/22 0440) BP: 148/84 mmHg (06/22 0930) Pulse Rate: 118 (06/22 0930) Intake/Output from previous day:   Intake/Output from this shift: Total I/O In: 284 [Blood:284] Out: -   Labs:  Recent Labs  05/22/14 0442 05/22/14 0522  WBC 6.5  --   HGB 8.5* 12.6  PLT 74*  --   CREATININE 4.08* 4.40*   The CrCl is unknown because both a height and weight (above a minimum accepted value) are required for this calculation. No results found for this basename: VANCOTROUGH, VANCOPEAK, VANCORANDOM, GENTTROUGH, GENTPEAK, GENTRANDOM, TOBRATROUGH, TOBRAPEAK, TOBRARND, AMIKACINPEAK, AMIKACINTROU, AMIKACIN,  in the last 72 hours   Microbiology: No results found for this or any previous visit (from the past 720 hour(s)).  Medical History: Past Medical History  Diagnosis Date  . ABNORMAL FINDINGS GI TRACT 09/12/2008  . ABNORMAL TRANSAMINASE-LFT'S 09/12/2008  . Acute bronchitis 07/21/2008  . Cirrhosis of liver without mention of alcohol 08/11/2008    sees Dr. Artis Beggs Perry  . DEPRESSIVE DISORDER, RCR, MODERATE 07/09/2007  . DIABETES MELLITUS, TYPE II 07/09/2007  . GERD 09/07/2008  . Headache(784.0) 09/24/2010  . HIATAL HERNIA 09/07/2008  . HYPERTENSION 07/09/2007  . LOW BACK PAIN 07/02/2010  . Overweight(278.02) 10/01/2007  . 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 Seizures   . Anemia   . Pancytopenia     sees Dr. Firas Shadad   . Thrombocytopenia, unspecified 04/19/2013  . Anemia, unspecified 04/19/2013    Medications:  See electronic med rec  Assessment: 48 y.o. female recently  admitted to [facility] with obtundation, ammonia >200. Treated with lactulose, diuresis, antibiotics for lower extremity cellulitis. Pt with nonalcoholic cirrhosis. Presents to [facility] ED with new onset of bloody emesis and decreased consciousness. Required intubation in ED. To begin empiric Rocephin for SBP.  6/22 Rocephin>>  6/22 BCx x 2>> 6/22 urine>> 6/22 trach asp>>  Goal of Therapy:  Empiric treatment of SBP  Plan:  1) Ceftriaxone 2gm IV q24h 2) Will f/u pt's clinical condition, micro data  Caron Amend, PharmD, BCPS Clinical pharmacist, pager [phone] 05/22/2014,10:11 AM   

## 2014-05-23 ENCOUNTER — Encounter (HOSPITAL_COMMUNITY): Payer: Self-pay | Admitting: Gastroenterology

## 2014-05-23 ENCOUNTER — Institutional Professional Consult (permissible substitution): Payer: Self-pay | Admitting: Cardiology

## 2014-05-23 ENCOUNTER — Telehealth: Payer: Self-pay | Admitting: Family Medicine

## 2014-05-23 DIAGNOSIS — K767 Hepatorenal syndrome: Secondary | ICD-10-CM

## 2014-05-23 DIAGNOSIS — R188 Other ascites: Secondary | ICD-10-CM

## 2014-05-23 LAB — GLUCOSE, CAPILLARY
GLUCOSE-CAPILLARY: 107 mg/dL — AB (ref 70–99)
GLUCOSE-CAPILLARY: 112 mg/dL — AB (ref 70–99)
GLUCOSE-CAPILLARY: 110 mg/dL — AB (ref 70–99)
Glucose-Capillary: 107 mg/dL — ABNORMAL HIGH (ref 70–99)
Glucose-Capillary: 107 mg/dL — ABNORMAL HIGH (ref 70–99)

## 2014-05-23 LAB — BODY FLUID CELL COUNT WITH DIFFERENTIAL
EOS FL: 2 %
LYMPHS FL: 51 %
MONOCYTE-MACROPHAGE-SEROUS FLUID: 9 % — AB (ref 50–90)
Neutrophil Count, Fluid: 38 % — ABNORMAL HIGH (ref 0–25)
Other Cells, Fluid: 0 %
Total Nucleated Cell Count, Fluid: 50 cu mm (ref 0–1000)

## 2014-05-23 LAB — PROTIME-INR
INR: 1.67 — AB (ref 0.00–1.49)
PROTHROMBIN TIME: 19.2 s — AB (ref 11.6–15.2)

## 2014-05-23 LAB — BLOOD GAS, ARTERIAL
Acid-base deficit: 5.4 mmol/L — ABNORMAL HIGH (ref 0.0–2.0)
BICARBONATE: 18.5 meq/L — AB (ref 20.0–24.0)
DRAWN BY: 39866
FIO2: 0.4 %
MECHVT: 470 mL
O2 SAT: 99.2 %
PCO2 ART: 30.2 mmHg — AB (ref 35.0–45.0)
PEEP/CPAP: 5 cmH2O
PH ART: 7.403 (ref 7.350–7.450)
PO2 ART: 133 mmHg — AB (ref 80.0–100.0)
Patient temperature: 97.9
RATE: 14 resp/min
TCO2: 19.5 mmol/L (ref 0–100)

## 2014-05-23 LAB — CBC
HCT: 25.3 % — ABNORMAL LOW (ref 36.0–46.0)
HCT: 25.3 % — ABNORMAL LOW (ref 36.0–46.0)
Hemoglobin: 8.3 g/dL — ABNORMAL LOW (ref 12.0–15.0)
Hemoglobin: 8.3 g/dL — ABNORMAL LOW (ref 12.0–15.0)
MCH: 29.9 pg (ref 26.0–34.0)
MCH: 29.9 pg (ref 26.0–34.0)
MCHC: 32.8 g/dL (ref 30.0–36.0)
MCHC: 32.8 g/dL (ref 30.0–36.0)
MCV: 91 fL (ref 78.0–100.0)
MCV: 91 fL (ref 78.0–100.0)
PLATELETS: 47 10*3/uL — AB (ref 150–400)
Platelets: 52 10*3/uL — ABNORMAL LOW (ref 150–400)
RBC: 2.78 MIL/uL — ABNORMAL LOW (ref 3.87–5.11)
RBC: 2.78 MIL/uL — ABNORMAL LOW (ref 3.87–5.11)
RDW: 20.1 % — AB (ref 11.5–15.5)
RDW: 20.1 % — AB (ref 11.5–15.5)
WBC: 6.9 10*3/uL (ref 4.0–10.5)
WBC: 6.9 10*3/uL (ref 4.0–10.5)

## 2014-05-23 LAB — URINE CULTURE
Colony Count: NO GROWTH
Culture: NO GROWTH
Special Requests: NORMAL

## 2014-05-23 LAB — PREPARE FRESH FROZEN PLASMA
Unit division: 0
Unit division: 0

## 2014-05-23 LAB — ALBUMIN, FLUID (OTHER): Albumin, Fluid: 0.6 g/dL

## 2014-05-23 LAB — BASIC METABOLIC PANEL
BUN: 44 mg/dL — ABNORMAL HIGH (ref 6–23)
CALCIUM: 6.8 mg/dL — AB (ref 8.4–10.5)
CO2: 18 mEq/L — ABNORMAL LOW (ref 19–32)
CREATININE: 4.04 mg/dL — AB (ref 0.50–1.10)
Chloride: 97 mEq/L (ref 96–112)
GFR calc Af Amer: 14 mL/min — ABNORMAL LOW (ref >90)
GFR, EST NON AFRICAN AMERICAN: 12 mL/min — AB (ref >90)
GLUCOSE: 113 mg/dL — AB (ref 70–99)
Potassium: 3.1 mEq/L — ABNORMAL LOW (ref 3.7–5.3)
SODIUM: 139 meq/L (ref 137–147)

## 2014-05-23 LAB — LEGIONELLA ANTIGEN, URINE: Legionella Antigen, Urine: NEGATIVE

## 2014-05-23 LAB — APTT: APTT: 39 s — AB (ref 24–37)

## 2014-05-23 MED ORDER — RIFAXIMIN 550 MG PO TABS
550.0000 mg | ORAL_TABLET | Freq: Two times a day (BID) | ORAL | Status: DC
  Administered 2014-05-25 – 2014-06-04 (×22): 550 mg
  Filled 2014-05-23 (×28): qty 1

## 2014-05-23 MED ORDER — ALBUMIN HUMAN 25 % IV SOLN
12.5000 g | INTRAVENOUS | Status: AC
  Administered 2014-05-23 (×3): 12.5 g via INTRAVENOUS
  Filled 2014-05-23 (×3): qty 50

## 2014-05-23 MED ORDER — POTASSIUM CHLORIDE 10 MEQ/50ML IV SOLN
10.0000 meq | INTRAVENOUS | Status: AC
  Administered 2014-05-23 (×4): 10 meq via INTRAVENOUS
  Filled 2014-05-23 (×3): qty 50

## 2014-05-23 MED ORDER — POTASSIUM CHLORIDE 10 MEQ/50ML IV SOLN
INTRAVENOUS | Status: AC
  Filled 2014-05-23: qty 50

## 2014-05-23 MED ORDER — ALBUMIN HUMAN 25 % IV SOLN
25.0000 g | Freq: Once | INTRAVENOUS | Status: AC
  Administered 2014-05-23: 25 g via INTRAVENOUS
  Filled 2014-05-23 (×2): qty 100

## 2014-05-23 MED ORDER — CALAMINE EX LOTN
TOPICAL_LOTION | Freq: Three times a day (TID) | CUTANEOUS | Status: DC
  Administered 2014-05-23 – 2014-05-24 (×4): 1 via TOPICAL
  Administered 2014-05-25 – 2014-05-28 (×8): via TOPICAL
  Administered 2014-05-28: 1 via TOPICAL
  Administered 2014-05-28: 10:00:00 via TOPICAL
  Administered 2014-05-29: 1 via TOPICAL
  Administered 2014-05-30 – 2014-06-03 (×12): via TOPICAL
  Administered 2014-06-03: 1 via TOPICAL
  Administered 2014-06-03 – 2014-06-10 (×7): via TOPICAL
  Filled 2014-05-23 (×3): qty 118

## 2014-05-23 NOTE — Progress Notes (Signed)
eLink Physician-Brief Progress Note Patient Name: Dana MatterMelissa B Petersen DOB: [birth year] MRN: [id number]  Date of Service  05/23/2014   HPI/Events of Note   Pruritus in setting of high creat and bili  eICU Interventions  Calamine lotion   Intervention Category Minor Interventions: Other:  RAMASWAMY,MURALI 05/23/2014, 5:51 PM

## 2014-05-23 NOTE — Telephone Encounter (Signed)
FYI...Received fax from [facility] to serve as notification that pt has been discharged from Home Care due to goals being met. ° °

## 2014-05-23 NOTE — Progress Notes (Signed)
INITIAL NUTRITION ASSESSMENT  DOCUMENTATION CODES Per approved criteria  -Obesity Unspecified   INTERVENTION: If pt to remain intubated >48 hours recommend starting tube feedings: Utilize 29M PEPuP Protocol: Initiate TF via OGT with Vital High Protein at 25 ml/h on day 1; on day 2, increase to goal rate of 55 ml/h (1320 ml per day) to provide 1320 kcals (24 kcal/kg Ideal weight), 115 gm protein, 1103 ml free water daily.   NUTRITION DIAGNOSIS: Inadequate oral intake related to inability to eat as evidenced by NPO/Vent status.   Goal: Enteral nutrition to provide 60-70% of estimated calorie needs (22-25 kcals/kg ideal body weight) and 100% of estimated protein needs, based on ASPEN guidelines for permissive underfeeding in critically ill obese individuals  Monitor:  Vent status, TF initiation, weight trend, diet advancement, labs  Reason for Assessment: Vent  48 y.o. female  Admitting Dx: <principal problem not specified>  ASSESSMENT: 48 year old female with a PMH of cirrhosis, LA Grade A esophagitis, GERD, and DM who presents to the hospital with hematemesis. The patient cannot speak at this time as she is intubated for airway protection. Additionally, there was the report of AMS.   05/22/14 EGD with seven bands placed to large mid to distal esoph varieces (Dr Elnoria HowardHung), portal gastropathy  Pt appears well nourished except for some mild temporal wasting. She remains intubated. Per weight history it appears pt's weight usually ranges between 184 lbs and 198 lbs.   Labs: low potassium, elevated BUN and creatinine, low calcium, low GFR  Height: Ht Readings from Last 1 Encounters:  05/22/14 5\' 4"  (1.626 m)    Weight: Wt Readings from Last 1 Encounters:  05/23/14 207 lb 10.8 oz (94.2 kg)    Ideal Body Weight: 120 lbs  % Ideal Body Weight: 172%  Wt Readings from Last 10 Encounters:  05/23/14 207 lb 10.8 oz (94.2 kg)  05/23/14 207 lb 10.8 oz (94.2 kg)  03/31/14 198 lb (89.812  kg)  02/07/14 184 lb (83.462 kg)  01/10/14 195 lb (88.451 kg)  06/24/13 184 lb (83.462 kg)  05/02/13 190 lb (86.183 kg)  04/19/13 184 lb 6.4 oz (83.643 kg)  04/05/13 198 lb (89.812 kg)  10/08/12 191 lb (86.637 kg)    Usual Body Weight: 184-198 lbs  % Usual Body Weight: 107%  BMI:  Body mass index is 35.63 kg/(m^2). (Obese)  Patient is currently intubated on ventilator support MV: 7.3 L/min Temp (24hrs), Avg:98.1 F (36.7 C), Min:97.5 F (36.4 C), Max:98.7 F (37.1 C)  Propofol: None   Estimated Nutritional Needs: Kcal: 1707 Protein: >/=109 grams Fluid: 2 L/day  Skin: intact; +3 generalized edema, +3 RLE and LLE edema, +1 RUE and LUE edema  Diet Order:    EDUCATION NEEDS: -No education needs identified at this time   Intake/Output Summary (Last 24 hours) at 05/23/14 1029 Last data filed at 05/23/14 0900  Gross per 24 hour  Intake 4133.92 ml  Output    585 ml  Net 3548.92 ml    Last BM: rectal tube; 6/23  Labs:   Recent Labs Lab 05/22/14 0442 05/22/14 0522 05/23/14 0450  NA 135* 138 139  K 3.2* 3.2* 3.1*  CL 93* 99 97  CO2 18*  --  18*  BUN 45* 41* 44*  CREATININE 4.08* 4.40* 4.04*  CALCIUM 6.8*  --  6.8*  GLUCOSE 95 92 113*    CBG (last 3)   Recent Labs  05/22/14 2327 05/23/14 0444 05/23/14 0830  GLUCAP 107* 107* 107*  Scheduled Meds: . antiseptic oral rinse  15 mL Mouth Rinse QID  . cefTRIAXone (ROCEPHIN)  IV  2 g Intravenous Q24H  . chlorhexidine  15 mL Mouth Rinse BID  . fentaNYL  50 mcg Intravenous Once  . lactulose  300 mL Rectal BID  . [START ON 05/25/2014] pantoprazole (PROTONIX) IV  40 mg Intravenous Q12H  . phytonadione (VITAMIN K) IV  10 mg Intravenous Daily  . potassium chloride  10 mEq Intravenous Q1 Hr x 4  . rifaximin  550 mg Per Tube BID    Continuous Infusions: . sodium chloride 100 mL/hr (05/23/14 0850)  . fentaNYL infusion INTRAVENOUS 100 mcg/hr (05/23/14 0745)  . norepinephrine (LEVOPHED) Adult infusion     . octreotide (SANDOSTATIN) infusion 25 mcg/hr (05/23/14 0530)    Past Medical History  Diagnosis Date  . ABNORMAL FINDINGS GI TRACT 09/12/2008  . ABNORMAL TRANSAMINASE-LFT'S 09/12/2008  . Acute bronchitis 07/21/2008  . Cirrhosis of liver without mention of alcohol 08/11/2008    sees Dr. Yancey FlemingsJohn Perry  . DEPRESSIVE DISORDER, RCR, MODERATE 07/09/2007  . DIABETES MELLITUS, TYPE II 07/09/2007  . GERD 09/07/2008  . Headache(784.0) 09/24/2010  . HIATAL HERNIA 09/07/2008  . HYPERTENSION 07/09/2007  . LOW BACK PAIN 07/02/2010  . Overweight(278.02) 10/01/2007  . 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 Seizures   . Anemia   . Pancytopenia     sees Dr. Eli HoseFiras Shadad   . Thrombocytopenia, unspecified 04/19/2013  . Anemia, unspecified 04/19/2013    Past Surgical History  Procedure Laterality Date  . Tubal ligation    . Carpal tunnel release    . Lumbar laminectomy      L5-S1  dr. Marianne Sofiaappling  . Esophagogastroduodenoscopy N/A 05/22/2014    Procedure: ESOPHAGOGASTRODUODENOSCOPY (EGD);  Surgeon: Theda BelfastPatrick D Hung, MD;  [facility];  Service: Endoscopy;  Laterality: N/A;  bedside     Ian Malkineanne Barnett RD, LDN Inpatient Clinical Dietitian Pager: [phone] After Hours Pager: [phone]

## 2014-05-23 NOTE — Progress Notes (Signed)
Notified Social Work that the pt was in process of getting medicaid but she needed to complete a psych eval. The husband gave us a copy of the form with the appointment information. [facility]

## 2014-05-23 NOTE — Progress Notes (Signed)
eLink Physician-Brief Progress Note Patient Name: Dana Petersen DOB: 1966-04-21 MRN: [id number]  Date of Service  05/23/2014   HPI/Events of Note  Hypokalemia   eICU Interventions  Potassium replaced   Intervention Category Minor Interventions: Electrolytes abnormality - evaluation and management  DETERDING,ELIZABETH 05/23/2014, 6:22 AM

## 2014-05-23 NOTE — Procedures (Signed)
Paracentesis Procedure Note  Pre-operative Diagnosis: Ascites  Post-operative Diagnosis: same  Indications: Ascites  Procedure Details  Consent: Informed consent was obtained. Risks of the procedure were discussed including: infection, bleeding, pain.  Under sterile conditions the patient was positioned. Betadine solution and sterile drapes were utilized.  1% buffered lidocaine was used to anesthetize the rt lower outer abd.. Fluid was obtained without any difficulties and minimal blood loss.  A dressing was applied to the wound and wound care instructions were provided.   Findings 10,000 ml of clear ascites  fluid was obtained. A sample was sent to Pathology for  wbc, and cell counts, as well as for infection analysis.  Complications:  None; patient tolerated the procedure well.          Condition: stable  Plan 60 gms of albumin 25% planned.   Brett CanalesSteve Minor ACNP [facility]Le [facility] PCCM Pager [phone] till 3 pm If no answer page [phone] 05/23/2014, 2:53 PM   Attending Attestation: I was present and scrubbed for the entire procedure.  Levy Pupaobert Byrum, MD, PhD 05/23/2014, 3:20 PM [facility] [phone] or if no answer [phone]

## 2014-05-23 NOTE — Progress Notes (Signed)
PULMONARY / CRITICAL CARE MEDICINE   Name: Dana Petersen MRN: [id number] DOB: 1966/01/30    ADMISSION DATE:  05/22/2014   REFERRING MD :  EDP PRIMARY SERVICE: PCCM  BRIEF PATIENT DESCRIPTION:  [age] F with cirrhosis of unclear etiology [facility] with hematemesis and decreased LOC. [facility]. Ad  SIGNIFICANT EVENTS / STUDIES:  6/22 admitted with UGIB ,AMS. Intubated in ED. GI consult. Octreotide, ceftriaxone initiated. Therapeutic paracentesis recommended. Mild coagulopathy > 2 units FFP administered. Hg 8.5 > one unit PRBCs. Plts 74K 6/22 CT head: NAD 6/22 EGD [facility] large mid to distal esophageal varices. Portal htn gastropathy. 7 bands placed  LINES / TUBES: ETT6/22  >>  L IJ CVL 6/22 >>   CULTURES: Urine 6/22 >>  Resp  6/22 >>  Blood 6/22 >>   ANTIBIOTICS: Ceftriaxone (SBP px) 6/22 >>   SUBJECTIVE:   VITAL SIGNS: Temp:  [97.5 F (36.4 C)-99.2 F (37.3 C)] 99.2 F (37.3 C) (06/23 1303) Pulse Rate:  [85-104] 104 (06/23 1124) Resp:  [0-33] 13 (06/23 1124) BP: (116-152)/(55-89) 142/89 mmHg (06/23 1124) SpO2:  [99 %-100 %] 100 % (06/23 1124) FiO2 (%):  [40 %] 40 % (06/23 1124) Weight:  [94.2 kg (207 lb 10.8 oz)] 94.2 kg (207 lb 10.8 oz) (06/23 0500) HEMODYNAMICS: CVP:  [10 mmHg-13 mmHg] 11 mmHg VENTILATOR SETTINGS: Vent Mode:  [-] CPAP FiO2 (%):  [40 %] 40 % Set Rate:  [14 bmp] 14 bmp Vt Set:  [470 mL] 470 mL PEEP:  [5 cmH20] 5 cmH20 Pressure Support:  [15 cmH20] 15 cmH20 Plateau Pressure:  [19 cmH20-36 cmH20] 20 cmH20 INTAKE / OUTPUT: Intake/Output     06/22 0701 - 06/23 0700 06/23 0701 - 06/24 0700   I.V. (mL/kg) 2703.9 (28.7) 252.5 (2.7)   Blood 811.5    Other 300    IV Piggyback 250 100   Total Intake(mL/kg) 4065.4 (43.2) 352.5 (3.7)   Urine (mL/kg/hr) 550 (0.2) 35 (0.1)   Total Output 550 35   Net +3515.4 +317.5        Emesis Occurrence 1 x      PHYSICAL  EXAMINATION: General: sedated, NAD Neuro: depressed LOC, MAEs, no focal deficits noted HEENT: mild sclericterus, otherwise WNL Cardiovascular: RRR s M Lungs: Clear anteriorly Abdomen: distended, dull, + shifting dullness Ext: symmetric LE edema, multiple ecchymoses Skin: multiple telangiectasias on chest, arms  LABS:  CBC  Recent Labs Lab 05/22/14 1959 05/23/14 0159 05/23/14 0800  WBC 9.3 6.9 6.9  HGB 8.6* 8.3* 8.3*  HCT 25.8* 25.3* 25.3*  PLT 61* 47* 52*   Coag's  Recent Labs Lab 05/22/14 0442 05/23/14 0450  APTT  --  39*  INR 1.71* 1.67*   BMET  Recent Labs Lab 05/22/14 0442 05/22/14 0522 05/23/14 0450  NA 135* 138 139  K 3.2* 3.2* 3.1*  CL 93* 99 97  CO2 18*  --  18*  BUN 45* 41* 44*  CREATININE 4.08* 4.40* 4.04*  GLUCOSE 95 92 113*   Electrolytes  Recent Labs Lab 05/22/14 0442 05/23/14 0450  CALCIUM 6.8* 6.8*   Sepsis Markers  Recent Labs Lab 05/22/14 0503 05/22/14 1155  LATICACIDVEN 3.77*  --   PROCALCITON  --  0.30   ABG  Recent Labs Lab 05/22/14 0859 05/23/14 0430  PHART 7.385 7.403  PCO2ART 38.1 30.2*  PO2ART 550.0* 133.0*   Liver Enzymes  Recent Labs Lab 05/22/14 0442  AST 78*  ALT  25  ALKPHOS 224*  BILITOT 3.1*  ALBUMIN 2.9*   Cardiac Enzymes No results found for this basename: TROPONINI, PROBNP,  in the last 168 hours Glucose  Recent Labs Lab 05/22/14 1541 05/22/14 1948 05/22/14 2327 05/23/14 0444 05/23/14 0830 05/23/14 1153  GLUCAP 124* 98 107* 107* 107* 112*     CXR: No acute disease    ASSESSMENT / PLAN:  PULMONARY A: Acute resp failure due to altered mental status  P:   Cont full vent support Cont vent bundle Daily SBT; tolerating 6/23 but does not appear ready to extubate  CARDIOVASCULAR A:  H/O HTN P:  Hold antihypertensives Monitor BP and rhythm  RENAL A:  Acute renal failure (baseline Cr 0.9) - relative hypovolemia (vs hepatorenal -doubt) Metabolic acidosis, elevated lactate  due to impaired clearance Hypokalemia P:   Monitor BMET intermittently Monitor I/Os Correct electrolytes as indicated Maintain MAP > 70 mmHg. PRN NE ordered Maintain CVP > 10. PRN albumin ordered Cautious repletion of K+ in setting of renal insuff  GASTROINTESTINAL A:  ESLD, cirrhosis - etiology unclear (previous eval by GI 2009 - thought to be alcoholic. Family denies prior alcoholism) UGIB due to esophageal variceal bleeding S/P EGD, variceal banding Large to moderate ascites P:   SUP: PPI Octreotide infusion, to end 6/24  GI following - therapeutic paracentesis 6/23 if possible Once stabilized and moving towards discharge, need to address impending need for liver transplantation    HEMATOLOGIC A:   Acute blood loss anemia Coagulopathic (secondary to liver disease)  Thrombocytopenia P:  DVT px: SCDs If actively bleeding, transfuse FFP to maintain INR < 1.5 If actively bleeding, transfuse plts to maintain > 75 - 100 K If actively bleeding, transfuse PRBCs to maintain Hgb > 8.5 and hemodynamic stability  Monitor CBC  Vitamin K given  INFECTIOUS A:   At risk for SBP in setting of variceal bleeding P:   Micro and abx as above Will send ascites for cx (even though she has been on abx)  ENDOCRINE A:  H/O DM II Risk of hypoglycemia in setting of renal and hepatic failure P:   Monitor CBG q 4 hrs Initiate SSI for glu > 180  NEUROLOGIC A:   Hepatic encephalopathy History of prior seizure though due to alprazolam withdrawal Chronic alprazolam use  P:   RASS goal: -1 Scheduled Lactulose ordered Daily WUA  TODAY'S SUMMARY:   I have personally obtained a history, examined the patient, evaluated laboratory and imaging results, formulated the assessment and plan and placed orders. CRITICAL CARE: The patient is critically ill with multiple organ systems failure and requires high complexity decision making for assessment and support, frequent evaluation and titration  of therapies, application of advanced monitoring technologies and extensive interpretation of multiple databases. Critical Care Time devoted to patient care services described in this note is 40 minutes.   Levy Pupaobert Byrum, MD, PhD 05/23/2014, 1:14 PM  Pulmonary and Critical Care [phone] or if no answer [phone]

## 2014-05-23 NOTE — Clinical Social Work Note (Signed)
CSW received a call from unit RN who stated pt/husband needed assistance with the completion of the medicaid application.  CSW called Elyse [facility](Med[facility] Specialist[facility]) at [phone] and left detailed message along with pt husband and RN contact information.  As a back-up, CSW also contacted the pt's financial counselor Berneda Rose(Deborah Goldson [phone]) and left same detailed message.  RN requests a call back from either regarding this process as well as contact with pt husband.  CSW remains available for assistance/direction.  Vickii PennaGina Ingle, LCSWA [phone]  Clinical Social Work

## 2014-05-23 NOTE — Progress Notes (Signed)
Daily Rounding Note  05/23/2014, 8:08 AM  LOS: 1 day   SUBJECTIVE:       Remains intubated on vent, some ability to respond to visitors.  Husband at bedside with questions about liver transplant.  Pt has had refractory ascites and anasarca, despite use of higher dose Lasix at home prior to [facility] admission one month ago.  Lasix dose was eventually settled at 40 mg BID.  Never placed on Aldactone.   Also started on the Lactulose at that admission but at home contd to have lethargy, some confusion, frequent falls.    OBJECTIVE:         Vital signs in last 24 hours:    Temp:  [97.5 F (36.4 C)-98.7 F (37.1 C)] 98.7 F (37.1 C) (06/23 0400) Pulse Rate:  [85-124] 102 (06/23 0805) Resp:  [0-27] 22 (06/23 0805) BP: (116-156)/(55-95) 147/78 mmHg (06/23 0805) SpO2:  [99 %-100 %] 100 % (06/23 0805) FiO2 (%):  [40 %] 40 % (06/23 0805) Weight:  [92.7 kg (204 lb 5.9 oz)-94.2 kg (207 lb 10.8 oz)] 94.2 kg (207 lb 10.8 oz) (06/23 0500)   General: critically ill looking WF.  Comfortable on vent.    Heart: RRR Chest: clear in front Abdomen:  Large, distended, tense, BS hypoactive. Piercing at umbilicus.  Rectal:  Liquid medium to light brown stool without visible blood seen in flexiseal catheter.    Extremities: anasarca up legs, into abdomen, swollen arms.  Bruising on right upper thigh/hip, upper left sternum.  Neuro/Psych:  Able to follow some commands, lethargic but arouses to her name.    Intake/Output from previous day: 06/22 0701 - 06/23 0700 In: 4065.4 [I.V.:2703.9; Blood:811.5; IV Piggyback:250] Out: 550 [Urine:550]  Intake/Output this shift:    Lab Results:  Recent Labs  05/22/14 1326 05/22/14 1959 05/23/14 0159  WBC 8.1 9.3 6.9  HGB 8.7* 8.6* 8.3*  HCT 26.3* 25.8* 25.3*  PLT 58* 61* 47*   BMET  Recent Labs  05/22/14 0442 05/22/14 0522 05/23/14 0450  NA 135* 138 139  K 3.2* 3.2* 3.1*  CL 93* 99 97  CO2  18*  --  18*  GLUCOSE 95 92 113*  BUN 45* 41* 44*  CREATININE 4.08* 4.40* 4.04*  CALCIUM 6.8*  --  6.8*   LFT  Recent Labs  05/22/14 0442  PROT 7.8  ALBUMIN 2.9*  AST 78*  ALT 25  ALKPHOS 224*  BILITOT 3.1*   PT/INR  Recent Labs  05/22/14 0442 05/23/14 0450  LABPROT 19.6* 19.2*  INR 1.71* 1.67*    Studies/Results: Ct Head Wo Contrast 05/22/2014   CLINICAL DATA:  Altered mental status, cirrhosis and GI bleed.  EXAM: CT HEAD WITHOUT CONTRAST  TECHNIQUE: Contiguous axial images were obtained from the base of the skull through the vertex without intravenous contrast.  COMPARISON:  CT of the head Apr 18, 2014  FINDINGS: The ventricles and sulci are normal. No intraparenchymal hemorrhage, mass effect nor midline shift. No acute large vascular territory infarcts.  No abnormal extra-axial fluid collections. Basal cisterns are patent.  No skull fracture. The included ocular globes and orbital contents are non-suspicious. The mastoid aircells are well-aerated. Worsening right ethmoid, frontal and maxillary sinusitis. Patient is edentulous.  IMPRESSION: No acute intracranial process.  Worsening right paranasal sinusitis.   Electronically Signed   By: Elon Alas   On: 05/22/2014 06:34   US Renal Port 05/22/2014   CLINICAL DATA:  Renal insufficiency 8  EXAM: RENAL/URINARY TRACT ULTRASOUND COMPLETE  COMPARISON:  Ultrasound for paracentesis dated 04/20/2014.  FINDINGS: Right Kidney:  Length: 11.9 cm. Echogenicity within normal limits. No mass or hydronephrosis visualized.  Left Kidney:  Length: 10.4 cm. Echogenicity within normal limits. No mass or hydronephrosis visualized.  Bladder:  Not visualized.  Note: Intra abdominal ascites is evident to a moderate -large degree.  IMPRESSION: No evidence for hydronephrosis.  Moderate volume ascites.   Electronically Signed   By: Misty Stanley M.D.   On: 05/22/2014 16:48   Dg Chest Portable 1 View 05/22/2014   CLINICAL DATA:  GI bleed. Altered mental  status. Check line placement.  EXAM: PORTABLE CHEST - 1 VIEW  COMPARISON:  05/22/2014  FINDINGS: Left central line is in place with the tip at the cavoatrial junction. Endotracheal tube has been retracted, now approximately 3 cm above the carina. NG tube is in the stomach.  Very low lung volumes with bibasilar atelectasis. Heart is normal size. No visible effusions or acute bony abnormality.  IMPRESSION: Left central line tip at the cavoatrial junction. No pneumothorax. Endotracheal tube 3 cm above the carina.  Low lung volumes, bibasilar atelectasis.   Electronically Signed   By: Rolm Baptise M.D.   On: 05/22/2014 10:37    Scheduled Meds: . antiseptic oral rinse  15 mL Mouth Rinse QID  . cefTRIAXone (ROCEPHIN)  IV  2 g Intravenous Q24H  . chlorhexidine  15 mL Mouth Rinse BID  . fentaNYL  50 mcg Intravenous Once  . lactulose  300 mL Rectal BID  . [START ON 05/25/2014] pantoprazole (PROTONIX) IV  40 mg Intravenous Q12H  . phytonadione (VITAMIN K) IV  10 mg Intravenous Daily  . pneumococcal 23 valent vaccine  0.5 mL Intramuscular Tomorrow-1000  . potassium chloride  10 mEq Intravenous Q1 Hr x 4   Continuous Infusions: . sodium chloride 100 mL/hr at 05/22/14 2326  . fentaNYL infusion INTRAVENOUS 100 mcg/hr (05/23/14 0745)  . norepinephrine (LEVOPHED) Adult infusion    . octreotide (SANDOSTATIN) infusion 25 mcg/hr (05/23/14 0530)   PRN Meds:.albumin human, fentaNYL, fentaNYL   ASSESMENT:   *  Hematemesis 05/22/14 EGD with seven bands placed to large mid to distal esoph varieces (Dr Benson [place]), portal gastropathy. No overt bleeding or stigmata of recent bleedng.  Day 2 octretotide, Rocephin, BID IV Protonix.   *  Ascites. On Lasix 80 per day but no Aldactone at home.   *  Cirrhosis. ETOH/NASH induced. Chronic elevation of Alk phos. Last epic AFP in 2009  *  Coagulopathy, secondary to hepatic synthetic dysfunction. Rx with IV Vit K x 2 days. S/p 1 FFP.  *  ABL anemia.  S/p 1 PRBC.   *  ARF.   BUN/Creat stable. These were normal on 04/19/13. T bili trending up in last 4 weeks.   *  Hyponatremia, non-critical.  Hypocalcemia.   *  Hypokalemia, MD ordered 4 runs KCL.   *  Hepatic encephalopathy, ongoing. Admission with this to [facility] late 03/2014.  Ammonia level 177 6/22, said to have been >200 in [facility]. Rx with rectal Lactulose. On po Lactulose at home. Not defined: how often she is using prn Oxycodone.   *  Sinusitis.   *  Type 2 DM.  Glucophage at home.   *  Recent LE cellulitis, treated during recent [facility] stay.   PLAN   *  Continue Octreotide drip for 72 hours.  *  BMET, CBC, coags, ammonia in AM.  *  Adding Rifaximin  550 BID, pharmacist to determine if it can be delivered via tube.     Dana Petersen  05/23/2014, 8:08 AM Pager: [phone]

## 2014-05-23 NOTE — Progress Notes (Signed)
Patient seen, examined, and I agree with the above documentation, including the assessment and plan. Critical illness with decompensated cirrhosis with variceal bleeding s/p banding x 7, PSE, tense ascites, and Bili 3.1 Acute renal failure 5 days of ABX given GI bleeding in setting of cirrhosis Long discussion with husband and son regarding severity of illness, overall poor prognosis They asked about OLTx, but must improve clinically before she could ever be considered Continue lactulose enemas, and change to oral lactulose and rifaximin when taking PO Will need repeat EGD for possible banding in 3-4 weeks Hold diuretics with ARF, but when restarted would recommend lasix and aldactone at ration of 40:100

## 2014-05-24 ENCOUNTER — Inpatient Hospital Stay (HOSPITAL_COMMUNITY): Payer: Medicaid Other

## 2014-05-24 DIAGNOSIS — I8501 Esophageal varices with bleeding: Secondary | ICD-10-CM

## 2014-05-24 LAB — BASIC METABOLIC PANEL
BUN: 41 mg/dL — AB (ref 6–23)
BUN: 38 mg/dL — AB (ref 6–23)
CALCIUM: 6.3 mg/dL — AB (ref 8.4–10.5)
CHLORIDE: 106 meq/L (ref 96–112)
CHLORIDE: 106 meq/L (ref 96–112)
CO2: 17 meq/L — AB (ref 19–32)
CO2: 17 meq/L — AB (ref 19–32)
CREATININE: 3.77 mg/dL — AB (ref 0.50–1.10)
CREATININE: 3.64 mg/dL — AB (ref 0.50–1.10)
Calcium: 6.4 mg/dL — CL (ref 8.4–10.5)
GFR calc Af Amer: 15 mL/min — ABNORMAL LOW (ref >90)
GFR calc Af Amer: 16 mL/min — ABNORMAL LOW (ref >90)
GFR calc non Af Amer: 13 mL/min — ABNORMAL LOW (ref >90)
GFR calc non Af Amer: 14 mL/min — ABNORMAL LOW (ref >90)
GLUCOSE: 109 mg/dL — AB (ref 70–99)
GLUCOSE: 111 mg/dL — AB (ref 70–99)
POTASSIUM: 3.3 meq/L — AB (ref 3.7–5.3)
Potassium: 2.9 mEq/L — CL (ref 3.7–5.3)
Sodium: 145 mEq/L (ref 137–147)
Sodium: 144 mEq/L (ref 137–147)

## 2014-05-24 LAB — CBC
HEMATOCRIT: 22.4 % — AB (ref 36.0–46.0)
HEMOGLOBIN: 7.3 g/dL — AB (ref 12.0–15.0)
MCH: 30.8 pg (ref 26.0–34.0)
MCHC: 32.6 g/dL (ref 30.0–36.0)
MCV: 94.5 fL (ref 78.0–100.0)
Platelets: 30 10*3/uL — ABNORMAL LOW (ref 150–400)
RBC: 2.37 MIL/uL — AB (ref 3.87–5.11)
RDW: 20.5 % — ABNORMAL HIGH (ref 11.5–15.5)
WBC: 4.4 10*3/uL (ref 4.0–10.5)

## 2014-05-24 LAB — GLUCOSE, CAPILLARY: Glucose-Capillary: 121 mg/dL — ABNORMAL HIGH (ref 70–99)

## 2014-05-24 LAB — PROTIME-INR
INR: 2.02 — ABNORMAL HIGH (ref 0.00–1.49)
Prothrombin Time: 22.2 seconds — ABNORMAL HIGH (ref 11.6–15.2)

## 2014-05-24 LAB — PHOSPHORUS: Phosphorus: 4.7 mg/dL — ABNORMAL HIGH (ref 2.3–4.6)

## 2014-05-24 LAB — AMMONIA: Ammonia: 116 umol/L — ABNORMAL HIGH (ref 11–60)

## 2014-05-24 LAB — MAGNESIUM: Magnesium: 0.9 mg/dL — CL (ref 1.5–2.5)

## 2014-05-24 LAB — PATHOLOGIST SMEAR REVIEW

## 2014-05-24 MED ORDER — DIPHENHYDRAMINE HCL 50 MG/ML IJ SOLN
12.5000 mg | Freq: Once | INTRAMUSCULAR | Status: AC
Start: 2014-05-24 — End: 2014-05-24
  Administered 2014-05-24: 12.5 mg via INTRAVENOUS
  Filled 2014-05-24: qty 1

## 2014-05-24 MED ORDER — MUPIROCIN 2 % EX OINT
TOPICAL_OINTMENT | CUTANEOUS | Status: AC
  Administered 2014-05-24: 1 via NASAL
  Filled 2014-05-24: qty 22

## 2014-05-24 MED ORDER — POTASSIUM CHLORIDE 10 MEQ/50ML IV SOLN
10.0000 meq | INTRAVENOUS | Status: AC
  Administered 2014-05-24 (×4): 10 meq via INTRAVENOUS
  Filled 2014-05-24: qty 50

## 2014-05-24 MED ORDER — MUPIROCIN 2 % EX OINT
1.0000 "application " | TOPICAL_OINTMENT | Freq: Two times a day (BID) | CUTANEOUS | Status: AC
  Administered 2014-05-24 – 2014-05-28 (×10): 1 via NASAL

## 2014-05-24 MED ORDER — MAGNESIUM SULFATE 40 MG/ML IJ SOLN
2.0000 g | Freq: Once | INTRAMUSCULAR | Status: AC
  Administered 2014-05-24: 2 g via INTRAVENOUS
  Filled 2014-05-24: qty 50

## 2014-05-24 MED ORDER — ONDANSETRON HCL 4 MG/2ML IJ SOLN
4.0000 mg | Freq: Four times a day (QID) | INTRAMUSCULAR | Status: DC | PRN
  Administered 2014-05-26 – 2014-06-09 (×5): 4 mg via INTRAVENOUS
  Filled 2014-05-24 (×6): qty 2

## 2014-05-24 MED ORDER — POTASSIUM CHLORIDE 10 MEQ/50ML IV SOLN: 10.0000 meq | INTRAVENOUS | Status: DC

## 2014-05-24 NOTE — Clinical Social Work Note (Addendum)
CSW spoke with Teresa-Medicaid Specialist who reports that she as an appointment with husband on Friday to discuss pending Medicaid application.  CSW provided update for unit RN.  Vickii PennaGina Ingle, LCSWA [phone]  Clinical Social Work

## 2014-05-24 NOTE — Progress Notes (Signed)
eLink Physician-Brief Progress Note Patient Name: Bing MatterMelissa B Hosek DOB: 10/18/1966 MRN: [id number]  Date of Service  05/24/2014   HPI/Events of Note   Itching +  eICU Interventions  Benadryl IV x1   Intervention Category Minor Interventions: Other:  RAMASWAMY,MURALI 05/24/2014, 6:13 PM

## 2014-05-24 NOTE — Progress Notes (Signed)
Progress Note   Subjective  intubated but awake   Objective   Vital signs in last 24 hours: Temp:  [97.6 F (36.4 C)-99.6 F (37.6 C)] 98.6 F (37 C) (06/24 0838) Pulse Rate:  [82-116] 90 (06/24 0800) Resp:  [0-39] 13 (06/24 0800) BP: (103-166)/(45-99) 122/54 mmHg (06/24 0800) SpO2:  [98 %-100 %] 100 % (06/24 0800) FiO2 (%):  [40 %] 40 % (06/24 0800) Weight:  [190 lb 14.7 oz (86.6 kg)] 190 lb 14.7 oz (86.6 kg) (06/24 0306)   General:    white female in NAD Heart:  Regular rate and rhythm Lungs: Intubated on vent. Abdomen:  Soft, nontender, mildly distended, active bowel sounds. Rectal: flexiseal in place, brown stool in bag Extremities:  Without edema. Neurologic:  Alert, nods head to questions.   Intake/Output from previous day: 06/23 0701 - 06/24 0700 In: 3152.5 [I.V.:2902.5; IV Piggyback:250] Out: [id number] [Urine:800; Stool:300] Intake/Output this shift: Total I/O In: 737.5 [I.V.:287.5; Other:450] Out: 60 [Urine:60]  Lab Results:  Recent Labs  05/23/14 0159 05/23/14 0800 05/24/14 0500  WBC 6.9 6.9 4.4  HGB 8.3* 8.3* 7.3*  HCT 25.3* 25.3* 22.4*  PLT 47* 52* 30*   BMET  Recent Labs  05/22/14 0442 05/22/14 0522 05/23/14 0450 05/24/14 0500  NA 135* 138 139 145  K 3.2* 3.2* 3.1* 2.9*  CL 93* 99 97 106  CO2 18*  --  18* 17*  GLUCOSE 95 92 113* 109*  BUN 45* 41* 44* 41*  CREATININE 4.08* 4.40* 4.04* 3.77*  CALCIUM 6.8*  --  6.8* 6.3*   LFT  Recent Labs  05/22/14 0442  PROT 7.8  ALBUMIN 2.9*  AST 78*  ALT 25  ALKPHOS 224*  BILITOT 3.1*   PT/INR  Recent Labs  05/23/14 0450 05/24/14 0500  LABPROT 19.2* 22.2*  INR 1.67* 2.02*    Studies/Results: [place] Renal Port  05/22/2014   CLINICAL DATA:  Renal insufficiency 8  EXAM: RENAL/URINARY TRACT ULTRASOUND COMPLETE  COMPARISON:  Ultrasound for paracentesis dated 04/20/2014.  FINDINGS: Right Kidney:  Length: 11.9 cm. Echogenicity within normal limits. No mass or hydronephrosis visualized.  Left  Kidney:  Length: 10.4 cm. Echogenicity within normal limits. No mass or hydronephrosis visualized.  Bladder:  Not visualized.  Note: Intra abdominal ascites is evident to a moderate -large degree.  IMPRESSION: No evidence for hydronephrosis.  Moderate volume ascites.   Electronically Signed   By: Kennith CenterEric  Mansell M.D.   On: 05/22/2014 16:48   Dg Chest Port 1 View  05/24/2014   CLINICAL DATA:  Check endotracheal tube  EXAM: PORTABLE CHEST - 1 VIEW  COMPARISON:  05/22/2014  FINDINGS: The cardiac shadow is stable. An endotracheal tube is noted 2.6 cm above the carina and stable in appearance. A left jugular central line is noted at the cavoatrial junction. No pneumothorax is seen. The lungs are well aerated bilaterally. A focal infiltrate or sizable effusion is seen.  IMPRESSION: Tubes and lines as described above.  No acute abnormality noted.   Electronically Signed   By: Alcide CleverMark  Lukens M.D.   On: 05/24/2014 07:04   Dg Chest Portable 1 View  05/22/2014   CLINICAL DATA:  GI bleed. Altered mental status. Check line placement.  EXAM: PORTABLE CHEST - 1 VIEW  COMPARISON:  05/22/2014  FINDINGS: Left central line is in place with the tip at the cavoatrial junction. Endotracheal tube has been retracted, now approximately 3 cm above the carina. NG tube is in the stomach.  Very low  lung volumes with bibasilar atelectasis. Heart is normal size. No visible effusions or acute bony abnormality.  IMPRESSION: Left central line tip at the cavoatrial junction. No pneumothorax. Endotracheal tube 3 cm above the carina.  Low lung volumes, bibasilar atelectasis.   Electronically Signed   By: Charlett NoseKevin  Dover M.D.   On: 05/22/2014 10:37     Assessment / Plan:   89. 48 year old female critically ill with decompensated cirrhosis. Etiology of cirrhosis?  Maybe NASH, she denies history of heavy ETOH use.  Viral hepatitis studies negative. ANA negative, Alpha 1 antitrypsin ok.  variceal bleed, s/p banding x 7. On day #3 of Octreotide. Will  need total of 7 days of antibiotics for SBP prevention in setting of GI bleed. On Rocephin. Will need repeat EGD with possible banding in 3-4 weeks.   PSE, continue lactulose enemas. Start Xifaxan when taking PO  Ascites, s/p 10,000 liters removed yesterday. No evidence for SBP  Coagulopathy, getting Vitamin K now. INR up overnight from 1.67 to 2.02  HCC surveillance.  Will check AFP, will need eventual imaging study if stablizes  Intubated for airway protection, will probably be extubated today per CCM  Thrombocytopenia, severe.   2. Hypokalemia, repletion already in progress.  3. AKI, improving. Diuretics on hold  4. Anemia of acute blood loss, hgb 7.3 today, down from baseline of 12. No evidence for recurrent bleeding. Vomiting small amount of yellowish material, ? Blood tinged. No melena    LOS: 2 days   Willette Clusteraula Guenther  05/24/2014, 9:29 AM

## 2014-05-24 NOTE — Progress Notes (Signed)
PULMONARY / CRITICAL CARE MEDICINE   Name: Dana Petersen MRN: [id number] DOB: 03-Dec-1965    ADMISSION DATE:  05/22/2014   REFERRING MD :  EDP PRIMARY SERVICE: PCCM  BRIEF PATIENT DESCRIPTION: [age] F with cirrhosis of unclear etiology [facility] 6/22 AM with hematemesis and decreased LOC. [facility] after hospitalization for hepatic encephalopathy. Ad  SIGNIFICANT EVENTS / STUDIES:  6/22 - admitted with UGIB ,AMS. Intubated in ED. GI consult. Octreotide, ceftriaxone initiated. Therapeutic paracentesis recommended. Mild coagulopathy > 2 units FFP administered. Hg 8.5 > one unit PRBCs. Plts 74K 6/22 - CT head: NAD 6/22 - EGD [facility]): large mid to distal esophageal varices. Portal htn gastropathy. 7 bands placed 6/24 - weaning on PSV, hypokalemia / hypomagnesemia   LINES / TUBES: ETT6/22  >>  L IJ CVL 6/22 >>   CULTURES: UC 6/22 >> neg Resp  6/22 >>  Blood 6/22 >>   ANTIBIOTICS: Ceftriaxone (SBP px) 6/22 >>   SUBJECTIVE: Vomiting this am, small amount pink / yellow fluid  Falls asleep on PSv and becomes apneic  VITAL SIGNS: Temp:  [97.6 F (36.4 C)-99.6 F (37.6 C)] 98.6 F (37 C) (06/24 0838) Pulse Rate:  [82-116] 90 (06/24 0800) Resp:  [0-39] 13 (06/24 0800) BP: (103-166)/(45-99) 122/54 mmHg (06/24 0800) SpO2:  [98 %-100 %] 100 % (06/24 0800) FiO2 (%):  [40 %] 40 % (06/24 0800) Weight:  [190 lb 14.7 oz (86.6 kg)] 190 lb 14.7 oz (86.6 kg) (06/24 0306)  HEMODYNAMICS: CVP:  [10 mmHg-16 mmHg] 10 mmHg  VENTILATOR SETTINGS: Vent Mode:  [-] CPAP FiO2 (%):  [40 %] 40 % Set Rate:  [14 bmp] 14 bmp Vt Set:  [470 mL] 470 mL PEEP:  [5 cmH20] 5 cmH20 Pressure Support:  [5 cmH20-15 cmH20] 5 cmH20 Plateau Pressure:  [10 cmH20-16 cmH20] 10 cmH20  INTAKE / OUTPUT: Intake/Output     06/23 0701 - 06/24 0700 06/24 0701 - 06/25 0700   I.V. (mL/kg) 2902.5 (33.5) 265 (3.1)   Blood     Other  450   IV Piggyback 250    Total Intake(mL/kg) 3152.5 (36.4)  715 (8.3)   Urine (mL/kg/hr) 800 (0.4) 60 (0.4)   Other 10000 (4.8)    Stool 300 (0.1)    Total Output [id number] 60   Net -7947.5 +655          PHYSICAL EXAMINATION: General: sedated, NAD Neuro: sedate, arouses, MAEs, no focal deficits noted HEENT: mild sclericterus, otherwise WNL Cardiovascular: RRR s M Lungs: Clear anteriorly Abdomen: less distended, bsx4 active Ext: symmetric LE edema, multiple ecchymoses Skin: multiple telangiectasias on chest, arms  LABS:  CBC  Recent Labs Lab 05/23/14 0159 05/23/14 0800 05/24/14 0500  WBC 6.9 6.9 4.4  HGB 8.3* 8.3* 7.3*  HCT 25.3* 25.3* 22.4*  PLT 47* 52* 30*   Coag's  Recent Labs Lab 05/22/14 0442 05/23/14 0450 05/24/14 0500  APTT  --  39*  --   INR 1.71* 1.67* 2.02*   BMET  Recent Labs Lab 05/22/14 0442 05/22/14 0522 05/23/14 0450 05/24/14 0500  NA 135* 138 139 145  K 3.2* 3.2* 3.1* 2.9*  CL 93* 99 97 106  CO2 18*  --  18* 17*  BUN 45* 41* 44* 41*  CREATININE 4.08* 4.40* 4.04* 3.77*  GLUCOSE 95 92 113* 109*   Electrolytes  Recent Labs Lab 05/22/14 0442 05/23/14 0450 05/24/14 0500  CALCIUM 6.8* 6.8* 6.3*  MG  --   --  0.9*  PHOS  --   --  4.7*   Sepsis Markers  Recent Labs Lab 05/22/14 0503 05/22/14 1155  LATICACIDVEN 3.77*  --   PROCALCITON  --  0.30   ABG  Recent Labs Lab 05/22/14 0859 05/23/14 0430  PHART 7.385 7.403  PCO2ART 38.1 30.2*  PO2ART 550.0* 133.0*   Liver Enzymes  Recent Labs Lab 05/22/14 0442  AST 78*  ALT 25  ALKPHOS 224*  BILITOT 3.1*  ALBUMIN 2.9*   Cardiac Enzymes No results found for this basename: TROPONINI, PROBNP,  in the last 168 hours Glucose  Recent Labs Lab 05/22/14 1948 05/22/14 2327 05/23/14 0444 05/23/14 0830 05/23/14 1153 05/23/14 1554  GLUCAP 98 107* 107* 107* 112* 110*     CXR: 6/24 - No acute disease    ASSESSMENT / PLAN:  PULMONARY A: Acute resp failure due to altered mental status  P:   Push SBT Remains too sleepy to  extubate Reduce sedation, discussed with RN  CARDIOVASCULAR A:  H/O HTN P:  Hold antihypertensives Monitor BP and rhythm  RENAL A:  Acute renal failure (baseline Cr 0.9) - relative hypovolemia (vs hepatorenal - doubt) Metabolic acidosis, elevated lactate due to impaired clearance Hypokalemia P:   Monitor BMET intermittently Monitor I/Os Correct electrolytes as indicated Maintain MAP > 70 mmHg. PRN NE  Maintain CVP > 10. PRN albumin  Cautious repletion of K+ in setting of renal insuff Supplemental Mg F/u BMP at 1600 6/24 Reduce IVF to 50 ml/hr  GASTROINTESTINAL A:  ESLD, cirrhosis - etiology unclear, previous eval by GI 2009 - thought to be alcoholic. Family denies prior alcoholism.  Prior negative autoimmune work up  UGIB due to esophageal variceal bleeding S/P EGD, variceal banding Large to moderate ascites P:   SUP: PPI Octreotide infusion, to end 6/24  GI following  Follow fluid culture (para 6/23) Once stabilized and moving towards discharge, need to address impending need for liver transplantation    HEMATOLOGIC A:   Acute blood loss anemia Coagulopathic (secondary to liver disease)  Thrombocytopenia P:  DVT px: SCDs If actively bleeding, transfuse FFP to maintain INR < 1.5 If actively bleeding, transfuse plts to maintain > 75 - 100 K If actively bleeding, transfuse PRBCs to maintain Hgb > 8.5 and hemodynamic stability  Monitor CBC   INFECTIOUS A:   At risk for SBP in setting of variceal bleeding P:   Micro and abx as above Follow ascitic fluid for cx (even though she has been on abx)  ENDOCRINE A:  H/O DM II Risk of hypoglycemia in setting of renal and hepatic failure P:   Monitor CBG q 4 hrs Initiate SSI for glu > 180  NEUROLOGIC A:   Hepatic encephalopathy History of prior seizure though due to alprazolam withdrawal Chronic alprazolam use  P:   RASS goal: 0 Scheduled Lactulose ordered Daily WUA   Canary Brim, NP-C [place]  [facility] Pgr: [phone] or [phone]    I have personally obtained a history, examined the patient, evaluated laboratory and imaging results, formulated the assessment and plan and placed orders.  CRITICAL CARE: The patient is critically ill with multiple organ systems failure and requires high complexity decision making for assessment and support, frequent evaluation and titration of therapies, application of advanced monitoring technologies and extensive interpretation of multiple databases. Critical Care Time devoted to patient care services described in this note is 45 minutes.   Levy Pupa, MD, PhD 05/24/2014, 12:38 PM [place] [facility] [phone]  or if no answer [phone]

## 2014-05-24 NOTE — Progress Notes (Signed)
CRITICAL VALUE ALERT  Critical value received:  Mg 0.9 K 2.9 Ca 6.3  Date of notification:  05/24/14  Time of notification:  0730  Critical value read back:yes  Nurse who received alert:  Paticia Stackyesha   MD notified (1st page):  Dr. Delton CoombesByrum  Time of first page:  0731  MD notified (2nd page):  Time of second page:  Responding MD:  Dr. Delton CoombesByrum  Time MD responded:  [phone]

## 2014-05-24 NOTE — Progress Notes (Signed)
Patient seen, examined, and I agree with the above documentation, including the assessment and plan. Patient remains critically ill unable to extubate today due to weakness I am concerned that her INR is increasing. Platelets are also dropping Mental status does seems to be improved, continue rectal lactulose until oral lactulose can be started. Would add oral rifaximin at that time as well Renal failure slightly better but still a big issue. Hopefully ATN, rather than HRS, as HRS would be another more ominous sign Monitor hemoglobin though no sign of active/recurrent bleeding Family not at bedside when I examined her today

## 2014-05-25 ENCOUNTER — Inpatient Hospital Stay (HOSPITAL_COMMUNITY): Payer: Medicaid Other

## 2014-05-25 LAB — BASIC METABOLIC PANEL
BUN: 38 mg/dL — AB (ref 6–23)
CHLORIDE: 109 meq/L (ref 96–112)
CO2: 16 meq/L — AB (ref 19–32)
Calcium: 6.6 mg/dL — ABNORMAL LOW (ref 8.4–10.5)
Creatinine, Ser: 3.44 mg/dL — ABNORMAL HIGH (ref 0.50–1.10)
GFR calc non Af Amer: 15 mL/min — ABNORMAL LOW (ref >90)
GFR, EST AFRICAN AMERICAN: 17 mL/min — AB (ref >90)
Glucose, Bld: 105 mg/dL — ABNORMAL HIGH (ref 70–99)
Potassium: 3 mEq/L — ABNORMAL LOW (ref 3.7–5.3)
Sodium: 146 mEq/L (ref 137–147)

## 2014-05-25 LAB — BLOOD GAS, ARTERIAL
Acid-base deficit: 8.3 mmol/L — ABNORMAL HIGH (ref 0.0–2.0)
Bicarbonate: 16.8 mEq/L — ABNORMAL LOW (ref 20.0–24.0)
Drawn by: 249101
FIO2: 0.4 %
MECHVT: 500 mL
O2 Saturation: 100 %
PATIENT TEMPERATURE: 98.6
PEEP/CPAP: 5 cmH2O
RATE: 12 resp/min
TCO2: 17.9 mmol/L (ref 0–100)
pCO2 arterial: 34.8 mmHg — ABNORMAL LOW (ref 35.0–45.0)
pH, Arterial: 7.305 — ABNORMAL LOW (ref 7.350–7.450)
pO2, Arterial: 178 mmHg — ABNORMAL HIGH (ref 80.0–100.0)

## 2014-05-25 LAB — MAGNESIUM: Magnesium: 1.3 mg/dL — ABNORMAL LOW (ref 1.5–2.5)

## 2014-05-25 LAB — PROTIME-INR
INR: 1.91 — ABNORMAL HIGH (ref 0.00–1.49)
PROTHROMBIN TIME: 21.9 s — AB (ref 11.6–15.2)

## 2014-05-25 LAB — AFP TUMOR MARKER: AFP-Tumor Marker: 2.2 ng/mL (ref 0.0–8.0)

## 2014-05-25 LAB — CBC
HEMATOCRIT: 22.9 % — AB (ref 36.0–46.0)
HEMOGLOBIN: 7.5 g/dL — AB (ref 12.0–15.0)
MCH: 30.5 pg (ref 26.0–34.0)
MCHC: 32.8 g/dL (ref 30.0–36.0)
MCV: 93.1 fL (ref 78.0–100.0)
Platelets: 34 10*3/uL — ABNORMAL LOW (ref 150–400)
RBC: 2.46 MIL/uL — ABNORMAL LOW (ref 3.87–5.11)
RDW: 20.3 % — ABNORMAL HIGH (ref 11.5–15.5)
WBC: 6.7 10*3/uL (ref 4.0–10.5)

## 2014-05-25 MED ORDER — FENTANYL CITRATE 0.05 MG/ML IJ SOLN
25.0000 ug | INTRAMUSCULAR | Status: DC | PRN
  Administered 2014-05-25: 50 ug via INTRAVENOUS
  Administered 2014-05-26 – 2014-05-27 (×6): 100 ug via INTRAVENOUS
  Filled 2014-05-25 (×6): qty 2

## 2014-05-25 MED ORDER — POTASSIUM CHLORIDE 10 MEQ/50ML IV SOLN
10.0000 meq | INTRAVENOUS | Status: AC
  Administered 2014-05-25 (×4): 10 meq via INTRAVENOUS
  Filled 2014-05-25 (×3): qty 50

## 2014-05-25 MED ORDER — KCL IN DEXTROSE-NACL 40-5-0.45 MEQ/L-%-% IV SOLN
INTRAVENOUS | Status: DC
Start: 2014-05-25 — End: 2014-05-29
  Administered 2014-05-25 – 2014-05-28 (×5): via INTRAVENOUS
  Filled 2014-05-25 (×6): qty 1000

## 2014-05-25 MED ORDER — MAGNESIUM SULFATE IN D5W 10-5 MG/ML-% IV SOLN
1.0000 g | Freq: Once | INTRAVENOUS | Status: AC
  Administered 2014-05-25: 1 g via INTRAVENOUS
  Filled 2014-05-25: qty 100

## 2014-05-25 MED ORDER — MIDAZOLAM HCL 2 MG/2ML IJ SOLN
4.0000 mg | Freq: Once | INTRAMUSCULAR | Status: AC
  Administered 2014-05-25: 4 mg via INTRAVENOUS

## 2014-05-25 MED ORDER — MAGNESIUM SULFATE 40 MG/ML IJ SOLN: 2.0000 g | Freq: Once | INTRAMUSCULAR | Status: DC

## 2014-05-25 MED ORDER — MIDAZOLAM HCL 2 MG/2ML IJ SOLN
INTRAMUSCULAR | Status: AC
  Filled 2014-05-25: qty 4

## 2014-05-25 MED ORDER — LACTULOSE 10 GM/15ML PO SOLN
30.0000 g | Freq: Four times a day (QID) | ORAL | Status: DC
  Administered 2014-05-25 – 2014-05-27 (×8): 30 g via ORAL
  Filled 2014-05-25 (×11): qty 45

## 2014-05-25 MED ORDER — FENTANYL CITRATE 0.05 MG/ML IJ SOLN
INTRAMUSCULAR | Status: AC
  Administered 2014-05-25: 50 ug via INTRAVENOUS
  Filled 2014-05-25: qty 2

## 2014-05-25 NOTE — Progress Notes (Signed)
eLink Physician-Brief Progress Note Patient Name: Dana MatterMelissa B Petersen DOB: 1966-09-24 MRN: [id number]  Date of Service  05/25/2014   HPI/Events of Note   Low K  eICU Interventions  Give K   Intervention Category Major Interventions: Other:  Dana Petersen 05/25/2014, 5:58 AM

## 2014-05-25 NOTE — Progress Notes (Signed)
PULMONARY / CRITICAL CARE MEDICINE   Name: Dana Petersen MRN: [id number] DOB: 1966/07/13    ADMISSION DATE:  05/22/2014   REFERRING MD :  EDP PRIMARY SERVICE: PCCM  BRIEF PATIENT DESCRIPTION: 29 F with cirrhosis of unclear etiology presented to [facility] ED 6/22 AM with hematemesis and decreased LOC. recently discharged from [facility] after hospitalization for hepatic encephalopathy. Ad  SIGNIFICANT EVENTS / STUDIES:  6/22 - admitted with UGIB, AMS. Intubated in ED. GI consult. Octreotide, ceftriaxone initiated. Therapeutic paracentesis recommended. Mild coagulopathy > 2 units FFP administered. Hg 8.5 > one unit PRBCs. Plts 74K 6/22 - CT head: NAD 6/22 - EGD [facility] large mid to distal esophageal varices. Portal htn gastropathy. 7 bands placed 6/24 - weaning on PSV, hypokalemia / hypomagnesemia  6/25 - extubated  LINES / TUBES: ETT6/22  >> 6/25 L IJ CVL 6/22 >>   CULTURES: UC 6/22 >> neg Resp  6/22 >> mod staph >> Blood 6/22 >> canceled / not drawn Peritoneal Fluid 6/22 >> rare WBC >>  ANTIBIOTICS: Ceftriaxone (SBP px) 6/22 >>   SUBJECTIVE:  Tolerated SBT, extubated to [place] O2.  Hypokalemia & ongoing itching overnight.    VITAL SIGNS: Temp:  [98 F (36.7 C)-98.8 F (37.1 C)] 98.2 F (36.8 C) (06/25 0854) Pulse Rate:  [83-117] 105 (06/25 0926) Resp:  [0-29] 12 (06/25 0926) BP: (117-154)/(53-123) 130/63 mmHg (06/25 0926) SpO2:  [95 %-100 %] 95 % (06/25 0926) FiO2 (%):  [30 %] 30 % (06/25 0800) Weight:  [196 lb 6.9 oz (89.1 kg)] 196 lb 6.9 oz (89.1 kg) (06/25 0500)  HEMODYNAMICS:    VENTILATOR SETTINGS: Vent Mode:  [-] CPAP FiO2 (%):  [30 %] 30 % Set Rate:  [14 bmp] 14 bmp Vt Set:  [470 mL] 470 mL PEEP:  [5 cmH20] 5 cmH20 Pressure Support:  [5 cmH20] 5 cmH20 Plateau Pressure:  [10 cmH20-12 cmH20] 12 cmH20  INTAKE / OUTPUT: Intake/Output     06/24 0701 - 06/25 0700 06/25 0701 - 06/26 0700   I.V. (mL/kg) 1865.8 (20.9) 62.5 (0.7)   Other 450    IV Piggyback 350 50    Total Intake(mL/kg) 2665.8 (29.9) 112.5 (1.3)   Urine (mL/kg/hr) 910 (0.4) 75 (0.3)   Other     Stool 400 (0.2)    Total Output 1310 75   Net +1355.8 +37.5          PHYSICAL EXAMINATION: General: chronically ill in NAD Neuro: drowsy, easily arouses, MAEs, no focal deficits noted HEENT: mild sclericterus, otherwise WNL Cardiovascular: RRR s M Lungs: Clear anteriorly, upper airway noise post extubation Abdomen: less distended, bsx4 active Ext: symmetric LE edema, multiple ecchymoses Skin: multiple telangiectasias on chest, arms  LABS:  CBC  Recent Labs Lab 05/23/14 0800 05/24/14 0500 05/25/14 0500  WBC 6.9 4.4 6.7  HGB 8.3* 7.3* 7.5*  HCT 25.3* 22.4* 22.9*  PLT 52* 30* 34*   Coag's  Recent Labs Lab 05/22/14 0442 05/23/14 0450 05/24/14 0500  APTT  --  39*  --   INR 1.71* 1.67* 2.02*   BMET  Recent Labs Lab 05/24/14 0500 05/24/14 1615 05/25/14 0500  NA 145 144 146  K 2.9* 3.3* 3.0*  CL 106 106 109  CO2 17* 17* 16*  BUN 41* 38* 38*  CREATININE 3.77* 3.64* 3.44*  GLUCOSE 109* 111* 105*   Electrolytes  Recent Labs Lab 05/24/14 0500 05/24/14 1615 05/25/14 0500  CALCIUM 6.3* 6.4* 6.6*  MG 0.9* 1.3*  --   PHOS 4.7*  --   --  Sepsis Markers  Recent Labs Lab 05/22/14 0503 05/22/14 1155  LATICACIDVEN 3.77*  --   PROCALCITON  --  0.30   ABG  Recent Labs Lab 05/22/14 0859 05/23/14 0430  PHART 7.385 7.403  PCO2ART 38.1 30.2*  PO2ART 550.0* 133.0*   Liver Enzymes  Recent Labs Lab 05/22/14 0442  AST 78*  ALT 25  ALKPHOS 224*  BILITOT 3.1*  ALBUMIN 2.9*   Glucose  Recent Labs Lab 05/22/14 2327 05/23/14 0444 05/23/14 0830 05/23/14 1153 05/23/14 1554 05/23/14 1947  GLUCAP 107* 107* 107* 112* 110* 121*     CXR: 6/24 - No acute disease, low lung volumes, bibasilar atx   ASSESSMENT / PLAN:  PULMONARY A: Acute resp failure due to metabolic encephalopathy P:   Push pulmonary hygiene Mobilize as able O2 to keep sats >  92%  CARDIOVASCULAR A:  H/O HTN P:  Hold antihypertensives Monitor BP and rhythm  RENAL A:  Acute renal failure (baseline Cr 0.9) - relative hypovolemia (vs hepatorenal - doubt) Metabolic acidosis, elevated lactate due to impaired clearance Hypokalemia Hypomagnesemia P:   Monitor BMET intermittently Monitor I/Os Correct electrolytes as indicated Maintain MAP > 70 mmHg. PRN NE  Maintain CVP > 10 with PRN albumin  Cautious repletion of K+ in setting of renal insuff Change IVF to D51/2 NS with 40 KCL @ 50 ml/hr Additional Mag 6/25   GASTROINTESTINAL A:  ESLD, cirrhosis - etiology unclear, previous eval by GI 2009 - thought to be alcoholic. Family denies prior alcoholism.  Prior negative autoimmune work up  UGIB due to esophageal variceal bleeding S/P EGD, variceal banding Large to moderate ascites P:   SUP: PPI Octreotide infusion, to end 6/25 GI following  Follow fluid culture (para 6/23) GI Rec > no placement of NGT, ok for liquids / pills as mental & respiratory status tolerate; I believe she will need NGT placed to allow lactulose at least short term. The benefit of not needing to reintubate would seem to outweigh the risks of bleeding with panda placement Once stabilized and moving towards discharge, need to address impending need for liver transplantation    HEMATOLOGIC A:   Acute blood loss anemia Coagulopathic (secondary to liver disease)  Thrombocytopenia P:  DVT px: SCDs If actively bleeding, transfuse FFP to maintain INR < 1.5 If actively bleeding, transfuse plts to maintain > 75 - 100 K If actively bleeding, transfuse PRBCs to maintain Hgb > 8.5 and hemodynamic stability  Monitor CBC  F/U INR 6/25 post vitamin K, if continues to elevate, lends to poor prognosis  INFECTIOUS A:   At risk for SBP in setting of variceal bleeding P:   Micro and abx as above Follow ascitic fluid for cx (even though she has been on abx)  ENDOCRINE A:  H/O DM II Risk of  hypoglycemia in setting of renal and hepatic failure P:   Monitor CBG q 4 hrs Initiate SSI for glu > 180 D5 to IVF now extubated and not on TF.  Monitor glucose.   NEUROLOGIC A:   Hepatic encephalopathy History of prior seizure though due to alprazolam withdrawal Chronic alprazolam use  P:   RASS goal: N/A Hopeful she will be able to tolerate PO xifaxan  Scheduled Lactulose ordered    Canary BrimBrandi Ollis, NP-C [place] [facility] Pgr: [phone] or [phone]   I have personally obtained a history, examined the patient, evaluated laboratory and imaging results, formulated the assessment and plan and placed orders.   CRITICAL CARE: The  patient is critically ill with multiple organ systems failure and requires high complexity decision making for assessment and support, frequent evaluation and titration of therapies, application of advanced monitoring technologies and extensive interpretation of multiple databases. Critical Care Time devoted to patient care services described in this note is 45 minutes.    Levy Pupaobert Byrum, MD, PhD 05/25/2014, 1:29 PM [place] [facility] [phone] or if no answer [phone]

## 2014-05-25 NOTE — Progress Notes (Signed)
UR Completed.  Dana Petersen [phone] 05/25/2014  

## 2014-05-25 NOTE — Progress Notes (Signed)
[facility] ADULT [facility] REPLACEMENT PROTOCOL FOR AM LAB REPLACEMENT ONLY  The patient does not apply for the [facility] Electrolyte [facility] Protocol based on the criteria listed below:    Is GFR >/= 40 ml/min? no  Patient's GFR today is 15    Abnormal electrolyte(s): K3.0   If a panic level lab has been reported, has the CCM MD in charge been notified? yes.   Physician:  Thea GistV Sood,MD  [place], Mark William 05/25/2014 5:55 AM

## 2014-05-25 NOTE — Progress Notes (Signed)
    Progress Note   Subjective  awake but drowsy, extubated   Objective   Vital signs in last 24 hours: Temp:  [98 F (36.7 C)-98.8 F (37.1 C)] 98.2 F (36.8 C) (06/25 0854) Pulse Rate:  [83-117] 105 (06/25 0926) Resp:  [0-29] 12 (06/25 0926) BP: (117-154)/(53-123) 130/63 mmHg (06/25 0926) SpO2:  [95 %-100 %] 95 % (06/25 0926) FiO2 (%):  [30 %-40 %] 30 % (06/25 0800) Weight:  [196 lb 6.9 oz (89.1 kg)] 196 lb 6.9 oz (89.1 kg) (06/25 0500)   General:    white female in NAD Heart:  Tachycardic, regular rhythm Lungs: Respirations even and unlabored, extubated Abdomen:  Soft, mildly distended, abdominal wall edema, a few bowel sounds. . Extremities:  Bilateral lower extremity edema. Neurologic:  Awake but drowsy. Psych:  Cooperative. Normal mood and affect.  Lab Results:  Recent Labs  05/23/14 0800 05/24/14 0500 05/25/14 0500  WBC 6.9 4.4 6.7  HGB 8.3* 7.3* 7.5*  HCT 25.3* 22.4* 22.9*  PLT 52* 30* 34*   BMET  Recent Labs  05/24/14 0500 05/24/14 1615 05/25/14 0500  NA 145 144 146  K 2.9* 3.3* 3.0*  CL 106 106 109  CO2 17* 17* 16*  GLUCOSE 109* 111* 105*  BUN 41* 38* 38*  CREATININE 3.77* 3.64* 3.44*  CALCIUM 6.3* 6.4* 6.6*     Assessment / Plan:    641. 48 year old female critically ill with decompensated cirrhosis.  variceal bleed, s/p banding x 7. On day #4 of Octreotide, will look to d/c.  On day #4 of Rocephin, will need total of 7 days of antibiotics for SBP prevention in setting of GI bleed.   Will need repeat EGD with possible banding in 3-4 weeks.  PSE. Extubated, hopefully she can start oral lactulose and Xifaxan today.   Ascites, s/p 10,000 liters removed two days ago. No evidence for SBP  Coagulopathy, got additional Vitamin K yesterday, will make sure INR drawn for today.  HCC surveillance. AFP normal, need eventual imaging study if stablizes  Intubated for airway protection, will probably be extubated today per CCM  Thrombocytopenia, severe.     2. Hypokalemia, persistent. Getting more IV runs today.   3. AKI, slowly improving. Diuretics on hold   4. Anemia of acute blood loss, hgb stable at 7.5 today, down from baseline of 12. No evidence for recurrent bleeding.    LOS: 3 days   Dana Petersen  05/25/2014, 9:34 AM

## 2014-05-25 NOTE — Progress Notes (Signed)
Patient seen, examined, and I agree with the above documentation, including the assessment and plan. Extubated earlier today, but had to be reintubated due to somnolence/altered mental status impairing regular respiration. This is concerning given ongoing hepatic encephalopathy Panda tube placed, initiate lactulose per tube and rifaximin 550 twice a day per tube Renal function slowly improving which is a positive sign INR slightly better at 1.9, but meld score remains quite high Poor prognosis, hopefully she can turn the corner and improve enough to leave the hospital so that transplant workup can be pursued Discussed with husband and explained critical illness with poor prognosis overall at this time, barring significant improvement

## 2014-05-25 NOTE — Progress Notes (Signed)
PT ETT pulled back from 22 cm at the lip to 20cm at the lip. ETT secure, RT will continue to monitor.

## 2014-05-25 NOTE — Procedures (Signed)
Extubation Procedure Note  Patient Details:   Name: Dana Petersen DOB: 05/16/1966 MRN: [id number]   Airway Documentation:  Airway 7.5 mm (Active)  Secured at (cm) 19 cm 05/25/2014  7:42 AM  Measured From Lips 05/25/2014  7:42 AM  Secured Location Right 05/25/2014  7:42 AM  Secured By [employer] Tube Holder 05/25/2014  7:42 AM  Tube Holder Repositioned Yes 05/25/2014  7:42 AM  Cuff Pressure (cm H2O) 23 cm H2O 05/25/2014  7:42 AM  Site Condition Dry 05/25/2014  3:33 AM    Evaluation  O2 sats: stable throughout Complications: No apparent complications Patient did tolerate procedure well. Bilateral Breath Sounds: Clear Suctioning: Airway Yes  Ok AnisKelly Smith, MA 05/25/2014, 9:25 AM

## 2014-05-25 NOTE — Procedures (Signed)
Intubation Procedure Note Dana MatterMelissa B Petersen [id number] 1966/06/29  Procedure: Intubation Indications: Airway protection and maintenance  Procedure Details Consent: Risks of procedure as well as the alternatives and risks of each were explained to the (patient/caregiver).  Consent for procedure obtained. Time Out: Verified patient identification, verified procedure, site/side was marked, verified correct patient position, special equipment/implants available, medications/allergies/relevent history reviewed, required imaging and test results available.  Performed  Maximum sterile technique was used including gloves, gown, hand hygiene and mask.  MAC and 4    Evaluation Hemodynamic Status: BP stable throughout; O2 sats: stable throughout Patient's Current Condition: stable Complications: No apparent complications Patient did tolerate procedure well. Chest X-ray ordered to verify placement.  CXR: pending.  Levy Pupaobert Byrum, MD, PhD 05/25/2014, 2:33 PM [place] [facility] [phone] or if no answer [phone]

## 2014-05-25 NOTE — Progress Notes (Signed)
Pt unable to perform flutter valve with RT assistance. Pt falls asleep before blowing into mouthpiece. Awakens when stimulated or spoken to but unable to stay awake.

## 2014-05-25 NOTE — Progress Notes (Signed)
ANTIBIOTIC CONSULT NOTE - FOLLOW UP  Pharmacy Consult for Ceftriaxone Indication: empiric for SBP  Allergies  Allergen Reactions  . Aspirin Other (See Comments)    Causes elevated liver enzymes  . Phenergan [Promethazine Hcl] Other (See Comments)    Muscle spasms  . Tylenol [Acetaminophen]     Caused elevated liver enzymes    Patient Measurements: Height: 5\' 4"  (162.6 cm) Weight: 196 lb 6.9 oz (89.1 kg) IBW/kg (Calculated) : 54.7 Vital Signs: Temp: 98.2 F (36.8 C) (06/25 0854) Temp src: Oral (06/25 0854) BP: 130/63 mmHg (06/25 0926) Pulse Rate: 105 (06/25 0926) Intake/Output from previous day: 06/24 0701 - 06/25 0700 In: 2665.8 [I.V.:1865.8; IV Piggyback:350] Out: 1310 [Urine:910; Stool:400] Intake/Output from this shift: Total I/O In: 112.5 [I.V.:62.5; IV Piggyback:50] Out: 75 [Urine:75]  Labs:  Recent Labs  05/23/14 0800 05/24/14 0500 05/24/14 1615 05/25/14 0500  WBC 6.9 4.4  --  6.7  HGB 8.3* 7.3*  --  7.5*  PLT 52* 30*  --  34*  CREATININE  --  3.77* 3.64* 3.44*   Estimated Creatinine Clearance: 21.6 ml/min (by C-G formula based on Cr of 3.44).  Microbiology: Recent Results (from the past 720 hour(s))  MRSA PCR SCREENING     Status: Abnormal   Collection Time    05/22/14 11:22 AM      Result Value Ref Range Status   MRSA by PCR POSITIVE (*) NEGATIVE Final   Comment:            The GeneXpert MRSA Assay (FDA     approved for NASAL specimens     only), is one component of a     comprehensive MRSA colonization     surveillance program. It is not     intended to diagnose MRSA     infection nor to guide or     monitor treatment for     MRSA infections.     RESULT CALLED TO, READ BACK BY AND VERIFIED WITH:     ALona Kettle. SHYSHKO RN 12:55 05/22/14 ([email or web address])  URINE CULTURE     Status: None   Collection Time    05/22/14 12:35 PM      Result Value Ref Range Status   Specimen Description URINE, CATHETERIZED   Final   Special Requests Normal   Final   Culture   Setup Time     Final   Value: 05/22/2014 13:00     Performed at [facility]   Colony Count     Final   Value: NO GROWTH     Performed at [facility]   Culture     Final   Value: NO GROWTH     Performed at [facility]   Report Status 05/23/2014 FINAL   Final  BODY FLUID CULTURE     Status: None   Collection Time    05/23/14  3:22 PM      Result Value Ref Range Status   Specimen Description FLUID PERITONEAL   Final   Special Requests NONE   Final   Gram Stain     Final   Value: RARE WBC PRESENT, PREDOMINANTLY PMN     NO ORGANISMS SEEN     Performed at [facility]   Culture PENDING   Incomplete   Report Status PENDING   Incomplete  CULTURE, RESPIRATORY (NON-EXPECTORATED)     Status: None   Collection Time    05/23/14  9:08 PM      Result Value  Ref Range Status   Specimen Description TRACHEAL ASPIRATE   Final   Special Requests Normal   Final   Gram Stain     Final   Value: FEW WBC PRESENT,BOTH PMN AND MONONUCLEAR     RARE SQUAMOUS EPITHELIAL CELLS PRESENT     FEW GRAM POSITIVE COCCI IN PAIRS     IN CLUSTERS     Performed at [facility]   Culture     Final   Value: MODERATE STAPHYLOCOCCUS AUREUS     Note: RIFAMPIN AND GENTAMICIN SHOULD NOT BE USED AS SINGLE DRUGS FOR TREATMENT OF STAPH INFECTIONS.     Performed at [facility]   Report Status PENDING   Incomplete    Anti-infectives   Start     Dose/Rate Route Frequency Ordered Stop   05/23/14 1000  rifaximin (XIFAXAN) tablet 550 mg     550 mg Per Tube 2 times daily 05/23/14 0913     05/22/14 1100  cefTRIAXone (ROCEPHIN) 2 g in dextrose 5 % 50 mL IVPB     2 g 100 mL/hr over 30 Minutes Intravenous Every 24 hours 05/22/14 1028     05/22/14 0600  cefTRIAXone (ROCEPHIN) 1 g in dextrose 5 % 50 mL IVPB     1 g 100 mL/hr over 30 Minutes Intravenous  Once 05/22/14 0546 05/22/14 0726     Assessment: 48 YOF on day #4 Rocephin empiric for SBP. WBC is within normal limits.  Afebrile.  Note itching per patient due to liver disease. Note staph in tracheal aspirate- discussed with MD. Not treating at this time.   Goal of Therapy:  Clinical resolution of infection  Plan:  1. Continue Rocephin 2g IV q24h.  2. Follow-up culture results and adjust therapy as able.   Link SnufferJessica Kegan Shepardson, PharmD, BCPS Clinical Pharmacist [phone] 05/25/2014,11:48 AM

## 2014-05-26 ENCOUNTER — Inpatient Hospital Stay (HOSPITAL_COMMUNITY): Payer: Medicaid Other

## 2014-05-26 DIAGNOSIS — D696 Thrombocytopenia, unspecified: Secondary | ICD-10-CM

## 2014-05-26 DIAGNOSIS — K449 Diaphragmatic hernia without obstruction or gangrene: Secondary | ICD-10-CM

## 2014-05-26 LAB — CULTURE, RESPIRATORY: Special Requests: NORMAL

## 2014-05-26 LAB — PROTIME-INR
INR: 1.92 — ABNORMAL HIGH (ref 0.00–1.49)
Prothrombin Time: 22 seconds — ABNORMAL HIGH (ref 11.6–15.2)

## 2014-05-26 LAB — CBC
HCT: 24.4 % — ABNORMAL LOW (ref 36.0–46.0)
Hemoglobin: 8 g/dL — ABNORMAL LOW (ref 12.0–15.0)
MCH: 30.5 pg (ref 26.0–34.0)
MCHC: 32.8 g/dL (ref 30.0–36.0)
MCV: 93.1 fL (ref 78.0–100.0)
Platelets: 43 10*3/uL — ABNORMAL LOW (ref 150–400)
RBC: 2.62 MIL/uL — ABNORMAL LOW (ref 3.87–5.11)
RDW: 20.5 % — ABNORMAL HIGH (ref 11.5–15.5)
WBC: 6.5 10*3/uL (ref 4.0–10.5)

## 2014-05-26 LAB — TYPE AND SCREEN
ABO/RH(D): O POS
ANTIBODY SCREEN: NEGATIVE
UNIT DIVISION: 0
Unit division: 0

## 2014-05-26 LAB — BASIC METABOLIC PANEL
BUN: 38 mg/dL — ABNORMAL HIGH (ref 6–23)
CO2: 16 mEq/L — ABNORMAL LOW (ref 19–32)
CREATININE: 3.43 mg/dL — AB (ref 0.50–1.10)
Calcium: 7.8 mg/dL — ABNORMAL LOW (ref 8.4–10.5)
Chloride: 108 mEq/L (ref 96–112)
GFR calc Af Amer: 17 mL/min — ABNORMAL LOW (ref >90)
GFR calc non Af Amer: 15 mL/min — ABNORMAL LOW (ref >90)
Glucose, Bld: 175 mg/dL — ABNORMAL HIGH (ref 70–99)
Potassium: 3.6 mEq/L — ABNORMAL LOW (ref 3.7–5.3)
Sodium: 145 mEq/L (ref 137–147)

## 2014-05-26 LAB — HEPATIC FUNCTION PANEL
ALT: 17 U/L (ref 0–35)
AST: 43 U/L — ABNORMAL HIGH (ref 0–37)
Albumin: 2.7 g/dL — ABNORMAL LOW (ref 3.5–5.2)
Alkaline Phosphatase: 157 U/L — ABNORMAL HIGH (ref 39–117)
Bilirubin, Direct: 1.3 mg/dL — ABNORMAL HIGH (ref 0.0–0.3)
Indirect Bilirubin: 0.8 mg/dL (ref 0.3–0.9)
TOTAL PROTEIN: 6.3 g/dL (ref 6.0–8.3)
Total Bilirubin: 2.1 mg/dL — ABNORMAL HIGH (ref 0.3–1.2)

## 2014-05-26 LAB — CULTURE, RESPIRATORY W GRAM STAIN

## 2014-05-26 NOTE — Progress Notes (Signed)
Progress Note   Subjective  More awake this morning, making eye contact and nodding in response to questions appropriately Still sleepy Still intubated with restraints in place Husband at bedside Shakes head to indicate negative response when asked about pain Significant light brown stool from rectal tube with per tube lactulose No melena   Objective  Vital signs in last 24 hours: Temp:  [98.2 F (36.8 C)-98.9 F (37.2 C)] 98.5 F (36.9 C) (06/26 0850) Pulse Rate:  [92-117] 102 (06/26 0837) Resp:  [0-28] 17 (06/26 0837) BP: (112-146)/(53-81) 140/67 mmHg (06/26 0837) SpO2:  [95 %-100 %] 100 % (06/26 0837) FiO2 (%):  [30 %-40 %] 30 % (06/26 0837) Weight:  [195 lb 1.7 oz (88.5 kg)] 195 lb 1.7 oz (88.5 kg) (06/26 0500)   Gen: Intubated but more awake, chronically ill-appearing HEENT: Mildly icteric sclera, ET tube in place, op clear, temporal wasting CV: tachy, regular Pulm: course b/l anteriorly Abd: firm and distended with ascites, +BS, mild grimace with palpation Ext: 4+ edema/anasarca Neuro: sleepy, but responds to voice, makes eye contact, nods and shakes head appropriately to questions, moves all 4 extremities  Intake/Output from previous day: 06/25 0701 - 06/26 0700 In: 1462.5 [I.V.:1212.5; IV Piggyback:250] Out: 1525 [Urine:625; Stool:900] Intake/Output this shift: Total I/O In: 80 [I.V.:50; NG/GT:30] Out: 235 [Urine:35; Stool:200]  Lab Results:  Recent Labs  05/24/14 0500 05/25/14 0500 05/26/14 0500  WBC 4.4 6.7 6.5  HGB 7.3* 7.5* 8.0*  HCT 22.4* 22.9* 24.4*  PLT 30* 34* 43*   BMET  Recent Labs  05/24/14 1615 05/25/14 0500 05/26/14 0500  NA 144 146 145  K 3.3* 3.0* 3.6*  CL 106 109 108  CO2 17* 16* 16*  GLUCOSE 111* 105* 175*  BUN 38* 38* 38*  CREATININE 3.64* 3.44* 3.43*  CALCIUM 6.4* 6.6* 7.8*   LFT No results found for this basename: PROT, ALBUMIN, AST, ALT, ALKPHOS, BILITOT, BILIDIR, IBILI,  in the last 72 hours PT/INR  Recent  Labs  05/25/14 1100 05/26/14 0500  LABPROT 21.9* 22.0*  INR 1.91* 1.92*   Hepatitis Panel No results found for this basename: HEPBSAG, HCVAB, HEPAIGM, HEPBIGM,  in the last 72 hours  Studies/Results: Dg Chest Port 1 View  05/26/2014   CLINICAL DATA:  Assess airspace disease.  EXAM: PORTABLE CHEST - 1 VIEW  COMPARISON:  05/25/2014  FINDINGS: Enteric tube courses through the stomach and off the inferior portion of the film. Endotracheal tube has been pulled back with tip approximately 3.7 cm above the carina. Left IJ central venous catheter is unchanged. Lungs are hypoinflated without focal consolidation, effusion or pneumothorax. The cardiomediastinal silhouette and remainder of the exam is unchanged.  IMPRESSION: Hypoinflation without acute cardiopulmonary disease.  Tubes and lines as described.   Electronically Signed   By: Elberta Fortisaniel  Boyle M.D.   On: 05/26/2014 08:02   Dg Chest Port 1 View  05/25/2014   CLINICAL DATA:  Respiratory failure. Replacement of endotracheal tube and new feeding tube insertion.  EXAM: PORTABLE CHEST - 1 VIEW 2:57 p.m.  COMPARISON:  05/25/2014 at 4:53 a.m.  FINDINGS: Endotracheal tube tip is 7 mm above the carina. Dr. Delton CoombesByrum was informed.  Feeding tube tip is below the diaphragm. Left jugular vein catheter tip is in the superior vena cava at the cavoatrial junction in good position.  Heart size and vascularity are normal. Lungs are clear except for a minimal atelectasis at the left lung base. No acute osseous abnormality.  IMPRESSION: Minimal atelectasis at  the left lung base. Slightly low-lying endotracheal tube.   Electronically Signed   By: Geanie CooleyJim  Maxwell M.D.   On: 05/25/2014 15:18   Dg Chest Port 1 View  05/25/2014   CLINICAL DATA:  Endotracheal tube replacement  EXAM: PORTABLE CHEST - 1 VIEW  COMPARISON:  One-view chest 05/24/2014  FINDINGS: The heart size is exaggerated by low lung volumes. Endotracheal tube is in satisfactory position, 5 cm above the carina. A left IJ  line is stable. Mild bibasilar atelectasis is present. No focal airspace disease is evident.  IMPRESSION: 1. Repositioning of the endotracheal tube, now in satisfactory position. 2. Persistent low lung volumes. 3. Mild bibasilar atelectasis.   Electronically Signed   By: Gennette Pachris  Mattern M.D.   On: 05/25/2014 07:50   Dg Abd Portable 1v  05/25/2014   CLINICAL DATA:  Dobbhoff tube placement.  EXAM: PORTABLE ABDOMEN - 1 VIEW  COMPARISON:  None.  FINDINGS: Dobbhoff type tube noted with tip projected over distal stomach. No bowel distention. No free air.  IMPRESSION: Dobbhoff type tube noted with tip projected over distal stomach.   Electronically Signed   By: Maisie Fushomas  Register   On: 05/25/2014 13:25      Assessment & Plan  871. 48 year old female critically ill with decompensated cirrhosis, reintubated yesterday due to ongoing encephalopathy.  variceal bleed, s/p banding x 7. On day #5. Will stop octreotide.  On day #5 of Rocephin, will need total of 7 days of antibiotics for SBP prevention in setting of GI bleed.  Will need repeat EGD with possible banding in 3-4 weeks.  PSE - improving slowly, hopefully with per tube lactulose and rifaximin mental status will improve enough to allow extubation.  This has been one of her biggest problems over the last 24-48 hour Ascites, s/p 10,000 liters removed 3 days ago. No evidence for SBP.  Holding diuretics with recent renal failure  Coagulopathy, INR elevated at 1.9 but stable over the last 24 hours HCC surveillance. AFP normal, need eventual imaging study if stablizes  Thrombocytopenia, severe.   2. Hypokalemia, improving  3. AKI, very slight improvement today, not worsening. Felt to be ATN over HRS at this time.diuretics on hold    4. Anemia of acute blood loss, hgb stable at  8.5, actually slightly better from yesterday.  Discussed at length with husband at bedside. Explained critical illness. Hopefully she can make slow improvement. Would need to improve  greatly before she could be considered for formal transplant evaluation at transplant center  Active Problems:   DEPRESSIVE DISORDER, RCR, MODERATE   SEIZURE, GRAND MAL   HYPERTENSION   GERD   Liver cirrhosis   Substance abuse   Nicotine abuse   Hypokalemia   Thrombocytopenia, unspecified   Anemia, unspecified   Respiratory failure   Anemia, blood loss   Hepatic encephalopathy   Upper GI bleed   Ascites   Acute respiratory failure with hypoxemia   Coagulopathy   Acute blood loss anemia   Thrombocytopenia   Acute renal failure     LOS: 4 days   PYRTLE, JAY M  05/26/2014, 9:17 AM

## 2014-05-26 NOTE — Progress Notes (Signed)
    Progress Note   Subjective  awake   Objective   Vital signs in last 24 hours: Temp:  [98.2 F (36.8 C)-98.9 F (37.2 C)] 98.5 F (36.9 C) (06/26 0850) Pulse Rate:  [92-117] 102 (06/26 0837) Resp:  [0-28] 17 (06/26 0837) BP: (112-146)/(53-81) 140/67 mmHg (06/26 0837) SpO2:  [95 %-100 %] 100 % (06/26 0837) FiO2 (%):  [30 %-40 %] 30 % (06/26 0837) Weight:  [195 lb 1.7 oz (88.5 kg)] 195 lb 1.7 oz (88.5 kg) (06/26 0500)   General:    white female in NAD Heart:  Tachycardic Lungs: Intubated on vent.  Abdomen:  Soft, moderately distended, tympanitic with active bowel sounds. Normal bowel sounds. Extremities:  Bilateral lower extremity pitting edema. Neurologic:  Awake, nods to questions  Intake/Output from previous day: 06/25 0701 - 06/26 0700 In: 1462.5 [I.V.:1212.5; IV Piggyback:250] Out: 1525 [Urine:625; Stool:900] Intake/Output this shift: Total I/O In: 80 [I.V.:50; NG/GT:30] Out: 235 [Urine:35; Stool:200]  Lab Results:  Recent Labs  05/24/14 0500 05/25/14 0500 05/26/14 0500  WBC 4.4 6.7 6.5  HGB 7.3* 7.5* 8.0*  HCT 22.4* 22.9* 24.4*  PLT 30* 34* 43*   BMET  Recent Labs  05/24/14 1615 05/25/14 0500 05/26/14 0500  NA 144 146 145  K 3.3* 3.0* 3.6*  CL 106 109 108  CO2 17* 16* 16*  GLUCOSE 111* 105* 175*  BUN 38* 38* 38*  CREATININE 3.64* 3.44* 3.43*  CALCIUM 6.4* 6.6* 7.8*   PT/INR  Recent Labs  05/25/14 1100 05/26/14 0500  LABPROT 21.9* 22.0*  INR 1.91* 1.92*     Assessment / Plan:   48 year old female critically ill with decompensated cirrhosis.  variceal bleed, s/p banding x 7 and Octreotide x 3.5 days. On #5 of Rocephin, will need total of 7 days of antibiotics for SBP prevention in setting of GI bleed. Continue BID IV PPI  Will need repeat EGD with possible banding in 3-4 weeks.  PSE. Had to be reintubated yesterday.Karie Soda. Panda was placed, getting Xifaxan and lactulose through tube. She is awake today, looking around  Ascites, s/p 10,000  liters removed three days ago. No evidence for SBP  Coagulopathy, INR 1.92 yesterday.  HCC surveillance. AFP normal, need eventual imaging study if stablizes  Thrombocytopenia, severe.   2. Hypokalemia, improved.   3. AKI, creatinine still at 3.43. Diuretics on hold. Urine output 600cc yesterday  4. Anemia of acute blood loss, hgb stable at 8.0  5. Abdominal distention. Exam most c/w ileus.   LOS: 4 days   Willette Clusteraula Guenther  05/26/2014, 9:14 AM

## 2014-05-26 NOTE — Plan of Care (Signed)
Problem: Consults Goal: GI Bleeding Patient Education See Patient Education Module for education specifics.  Outcome: Progressing Educated the patient-no evidence of learning. Family educated and verbalizes understanding.

## 2014-05-26 NOTE — Progress Notes (Signed)
PULMONARY / CRITICAL CARE MEDICINE   Name: Dana Petersen MRN: [id number] DOB: November 06, 1966    ADMISSION DATE:  05/22/2014   REFERRING MD :  EDP PRIMARY SERVICE: PCCM  BRIEF PATIENT DESCRIPTION: 36 F with cirrhosis of unclear etiology presented to [facility] ED 6/22 AM with hematemesis and decreased LOC. recently discharged from [facility] after hospitalization for hepatic encephalopathy. Ad  SIGNIFICANT EVENTS / STUDIES:  6/22 - admitted with UGIB, AMS. Intubated in ED. GI consult. Octreotide, ceftriaxone initiated. Therapeutic paracentesis recommended. Mild coagulopathy > 2 units FFP administered. Hg 8.5 > one unit PRBCs. Plts 74K 6/22 - CT head: NAD 6/22 - EGD [facility]): large mid to distal esophageal varices. Portal htn gastropathy. 7 bands placed 6/24 - weaning on PSV, hypokalemia / hypomagnesemia  6/25 - extubated  LINES / TUBES: ETT6/22  >> 6/25, 6/25 >>  L IJ CVL 6/22 >>  Panda 6/25 >>   CULTURES: UC 6/22 >> negative Resp  6/22 >> mod staph >> MRSA Blood 6/22 >> canceled / not drawn Peritoneal Fluid 6/22 >> rare WBC >> negative  ANTIBIOTICS: Ceftriaxone (SBP px) 6/22 >> (plan d/c 6/28)  SUBJECTIVE:   Required re-intubation 6/25 pm   VITAL SIGNS: Temp:  [98.1 F (36.7 C)-98.9 F (37.2 C)] 98.1 F (36.7 C) (06/26 1256) Pulse Rate:  [92-117] 102 (06/26 1300) Resp:  [10-28] 12 (06/26 1300) BP: (112-147)/(53-81) 131/64 mmHg (06/26 1300) SpO2:  [98 %-100 %] 100 % (06/26 1300) FiO2 (%):  [30 %-40 %] 30 % (06/26 1219) Weight:  [88.5 kg (195 lb 1.7 oz)] 88.5 kg (195 lb 1.7 oz) (06/26 0500)  HEMODYNAMICS: CVP:  [9 mmHg-10 mmHg] 9 mmHg  VENTILATOR SETTINGS: Vent Mode:  [-] CPAP;PSV FiO2 (%):  [30 %-40 %] 30 % Set Rate:  [12 bmp] 12 bmp Vt Set:  [500 mL] 500 mL PEEP:  [5 cmH20] 5 cmH20 Pressure Support:  [8 cmH20-10 cmH20] 10 cmH20 Plateau Pressure:  [11 cmH20-19 cmH20] 16 cmH20  INTAKE / OUTPUT: Intake/Output     06/25 0701 - 06/26 0700 06/26 0701 - 06/27 0700   I.V.  (mL/kg) 1212.5 (13.7) 275 (3.1)   Other     NG/GT  210   IV Piggyback 250 50   Total Intake(mL/kg) 1462.5 (16.5) 535 (6)   Urine (mL/kg/hr) 625 (0.3) 115 (0.2)   Stool 900 (0.4) 200 (0.3)   Total Output 1525 315   Net -62.5 +220          PHYSICAL EXAMINATION: General: chronically ill in NAD Neuro: drowsy, easily arouses, MAEs, no focal deficits noted HEENT: mild sclericterus, otherwise WNL Cardiovascular: RRR s M Lungs: Clear anteriorly, no wheezes Abdomen: less distended, bsx4 active Ext: symmetric LE edema, multiple ecchymoses Skin: multiple telangiectasias on chest, arms  LABS:  CBC  Recent Labs Lab 05/24/14 0500 05/25/14 0500 05/26/14 0500  WBC 4.4 6.7 6.5  HGB 7.3* 7.5* 8.0*  HCT 22.4* 22.9* 24.4*  PLT 30* 34* 43*   Coag's  Recent Labs Lab 05/23/14 0450 05/24/14 0500 05/25/14 1100 05/26/14 0500  APTT 39*  --   --   --   INR 1.67* 2.02* 1.91* 1.92*   BMET  Recent Labs Lab 05/24/14 1615 05/25/14 0500 05/26/14 0500  NA 144 146 145  K 3.3* 3.0* 3.6*  CL 106 109 108  CO2 17* 16* 16*  BUN 38* 38* 38*  CREATININE 3.64* 3.44* 3.43*  GLUCOSE 111* 105* 175*   Electrolytes  Recent Labs Lab 05/24/14 0500 05/24/14 1615 05/25/14 0500 05/26/14  0500  CALCIUM 6.3* 6.4* 6.6* 7.8*  MG 0.9* 1.3*  --   --   PHOS 4.7*  --   --   --    Sepsis Markers  Recent Labs Lab 05/22/14 0503 05/22/14 1155  LATICACIDVEN 3.77*  --   PROCALCITON  --  0.30   ABG  Recent Labs Lab 05/22/14 0859 05/23/14 0430 05/25/14 1533  PHART 7.385 7.403 7.305*  PCO2ART 38.1 30.2* 34.8*  PO2ART 550.0* 133.0* 178.0*   Liver Enzymes  Recent Labs Lab 05/22/14 0442 05/26/14 0930  AST 78* 43*  ALT 25 17  ALKPHOS 224* 157*  BILITOT 3.1* 2.1*  ALBUMIN 2.9* 2.7*   Glucose  Recent Labs Lab 05/22/14 2327 05/23/14 0444 05/23/14 0830 05/23/14 1153 05/23/14 1554 05/23/14 1947  GLUCAP 107* 107* 107* 112* 110* 121*   Dg Chest Port 1 View  05/26/2014   CLINICAL  DATA:  Assess airspace disease.  EXAM: PORTABLE CHEST - 1 VIEW  COMPARISON:  05/25/2014  FINDINGS: Enteric tube courses through the stomach and off the inferior portion of the film. Endotracheal tube has been pulled back with tip approximately 3.7 cm above the carina. Left IJ central venous catheter is unchanged. Lungs are hypoinflated without focal consolidation, effusion or pneumothorax. The cardiomediastinal silhouette and remainder of the exam is unchanged.  IMPRESSION: Hypoinflation without acute cardiopulmonary disease.  Tubes and lines as described.   Electronically Signed   By: Elberta Fortisaniel  Boyle M.D.   On: 05/26/2014 08:02   Dg Chest Port 1 View  05/25/2014   CLINICAL DATA:  Respiratory failure. Replacement of endotracheal tube and new feeding tube insertion.  EXAM: PORTABLE CHEST - 1 VIEW 2:57 p.m.  COMPARISON:  05/25/2014 at 4:53 a.m.  FINDINGS: Endotracheal tube tip is 7 mm above the carina. Dr. Delton CoombesByrum was informed.  Feeding tube tip is below the diaphragm. Left jugular vein catheter tip is in the superior vena cava at the cavoatrial junction in good position.  Heart size and vascularity are normal. Lungs are clear except for a minimal atelectasis at the left lung base. No acute osseous abnormality.  IMPRESSION: Minimal atelectasis at the left lung base. Slightly low-lying endotracheal tube.   Electronically Signed   By: Geanie CooleyJim  Maxwell M.D.   On: 05/25/2014 15:18   Dg Chest Port 1 View  05/25/2014   CLINICAL DATA:  Endotracheal tube replacement  EXAM: PORTABLE CHEST - 1 VIEW  COMPARISON:  One-view chest 05/24/2014  FINDINGS: The heart size is exaggerated by low lung volumes. Endotracheal tube is in satisfactory position, 5 cm above the carina. A left IJ line is stable. Mild bibasilar atelectasis is present. No focal airspace disease is evident.  IMPRESSION: 1. Repositioning of the endotracheal tube, now in satisfactory position. 2. Persistent low lung volumes. 3. Mild bibasilar atelectasis.    Electronically Signed   By: Gennette Pachris  Mattern M.D.   On: 05/25/2014 07:50   Dg Abd Portable 1v  05/25/2014   CLINICAL DATA:  Dobbhoff tube placement.  EXAM: PORTABLE ABDOMEN - 1 VIEW  COMPARISON:  None.  FINDINGS: Dobbhoff type tube noted with tip projected over distal stomach. No bowel distention. No free air.  IMPRESSION: Dobbhoff type tube noted with tip projected over distal stomach.   Electronically Signed   By: Maisie Fushomas  Register   On: 05/25/2014 13:25     ASSESSMENT / PLAN:  PULMONARY A: Acute resp failure due to metabolic/ hepatic encephalopathy P:   Push pulmonary hygiene Mobilize as able O2 to keep sats >  92%  CARDIOVASCULAR A:  H/O HTN P:  Hold antihypertensives Monitor BP and rhythm  RENAL A:  Acute renal failure (baseline Cr 0.9) - relative hypovolemia (vs hepatorenal - doubt) Metabolic acidosis, elevated lactate due to impaired clearance Hypokalemia Hypomagnesemia P:   Monitor BMET intermittently Monitor I/Os Correct electrolytes as indicated Maintain MAP > 70 mmHg.  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 Family denies prior alcoholism.  Prior negative autoimmune work up  UGIB due to esophageal variceal bleeding S/P EGD, variceal banding Large to moderate ascites P:   SUP: PPI Octreotide infusion, ended 6/25 Follow fluid culture (para 6/23) GI Rec > no placement of NGT, ok for liquids / pills as mental & respiratory status tolerate; I believe she will need NGT placed to allow lactulose at least short term. The benefit of not needing to reintubate would seem to outweigh the risks of bleeding with panda placement Once stabilized and moving towards discharge, need to address impending need for liver transplantation    HEMATOLOGIC A:   Acute blood loss anemia Coagulopathic (secondary to liver  disease)  Thrombocytopenia P:  DVT px: SCDs If actively bleeding, transfuse FFP to maintain INR < 1.5 If actively bleeding, transfuse plts to maintain > 75 - 100 K If actively bleeding, transfuse PRBCs to maintain Hgb > 8.5 and hemodynamic stability  Monitor CBC   INFECTIOUS A:   At risk for SBP in setting of variceal bleeding P:   Micro and abx as above, plan 7 days ceftriaxone total Follow ascitic fluid for cx (even though she has been on abx)  ENDOCRINE A:  H/O DM II Risk of hypoglycemia in setting of renal and hepatic failure P:   Monitor CBG q 4 hrs Initiate SSI for glu > 180 D5 to IVF now extubated and not on TF.  Monitor glucose.   NEUROLOGIC A:   Hepatic encephalopathy History of prior seizure though due to alprazolam withdrawal Chronic alprazolam use  P:   RASS goal: N/A Scheduled Lactulose and xifaxan ordered   I have personally obtained a history, examined the patient, evaluated laboratory and imaging results, formulated the assessment and plan and placed orders.   CRITICAL CARE: The patient is critically ill with multiple organ systems failure and requires high complexity decision making for assessment and support, frequent evaluation and titration of therapies, application of advanced monitoring technologies and extensive interpretation of multiple databases. Critical Care Time devoted to patient care services described in this note is 45 minutes.    Levy Pupaobert Byrum, MD, PhD 05/26/2014, 2:00 PM [facility] [phone] or if no answer [phone]

## 2014-05-27 DIAGNOSIS — R791 Abnormal coagulation profile: Secondary | ICD-10-CM

## 2014-05-27 LAB — BODY FLUID CULTURE: Culture: NO GROWTH

## 2014-05-27 LAB — AMMONIA: Ammonia: 26 umol/L (ref 11–60)

## 2014-05-27 MED ORDER — LACTULOSE 10 GM/15ML PO SOLN
45.0000 g | Freq: Four times a day (QID) | ORAL | Status: DC
  Administered 2014-05-27 – 2014-06-01 (×19): 45 g via ORAL
  Filled 2014-05-27 (×24): qty 90

## 2014-05-27 MED ORDER — WHITE PETROLATUM GEL
Status: AC
  Administered 2014-05-27: 0.2
  Filled 2014-05-27: qty 5

## 2014-05-27 NOTE — Progress Notes (Signed)
Chaplain was paged to provide support for pt's husband.  Chaplain met with husband and offered pastoral counseling and support.  Husband was grieving appropriately, but did seem anxious regarding chaplain presence saying, "are you here to tell me something."  Chaplain informed him that chaplain coverage was there to support him during this time and for no other reason.  He seemed content with this response.  [name] husband was told how to reach chaplains if he so desires in the future and ended the visit.  He seemed grateful for the spiritual support.

## 2014-05-27 NOTE — Progress Notes (Signed)
Patient seen, examined, and I agree with the above documentation, including the assessment and plan. Remains critically ill but now extubated. Increase lactulose to combat hepatic ncephalopathy and also continue rifaximin

## 2014-05-27 NOTE — Progress Notes (Signed)
Patient ID: Bing MatterMelissa B Petersen, female   DOB: [birth year], 48 y.o.   MRN: [id number]   Subjective: Awake, but not responding verbally, nods head but not consistently   Objective:  Vital signs in last 24 hours: Temp:  [98.1 F (36.7 C)-99.4 F (37.4 C)] 99.4 F (37.4 C) (06/27 1200) Pulse Rate:  [95-119] 103 (06/27 1300) Resp:  [9-28] 10 (06/27 1300) BP: (117-160)/(62-101) 144/78 mmHg (06/27 1300) SpO2:  [100 %] 100 % (06/27 1300) FiO2 (%):  [30 %] 30 % (06/27 0956) Weight:  [191 lb 5.8 oz (86.8 kg)] 191 lb 5.8 oz (86.8 kg) (06/27 0100) Last BM Date: 05/27/14 General:   Alert,  Well-developed, ill appearing WF   in NAD Heart:  Regular rate and rhythm; no murmurs Pulm;scattered rhonchi Abdomen:  Fairly tight ascites, nontender, bs+Extremities:  2+ edema Neurologic:  Alert nonverbal  Psych:  Alert  Intake/Output from previous day: 06/26 0701 - 06/27 0700 In: 1645 [I.V.:1175; NG/GT:420; IV Piggyback:50] Out: 1498 [Urine:397; Emesis/NG output:1; Stool:1100] Intake/Output this shift: Total I/O In: 535 [I.V.:275; NG/GT:210; IV Piggyback:50] Out: 190 [Urine:90; Stool:100]  Lab Results:  Recent Labs  05/25/14 0500 05/26/14 0500  WBC 6.7 6.5  HGB 7.5* 8.0*  HCT 22.9* 24.4*  PLT 34* 43*   BMET  Recent Labs  05/24/14 1615 05/25/14 0500 05/26/14 0500  NA 144 146 145  K 3.3* 3.0* 3.6*  CL 106 109 108  CO2 17* 16* 16*  GLUCOSE 111* 105* 175*  BUN 38* 38* 38*  CREATININE 3.64* 3.44* 3.43*  CALCIUM 6.4* 6.6* 7.8*   LFT  Recent Labs  05/26/14 0930  PROT 6.3  ALBUMIN 2.7*  AST 43*  ALT 17  ALKPHOS 157*  BILITOT 2.1*  BILIDIR 1.3*  IBILI 0.8   PT/INR  Recent Labs  05/25/14 1100 05/26/14 0500  LABPROT 21.9* 22.0*  INR 1.91* 1.92*     Assessment / Plan: #1  48 yo female with severely decompensated  ESLD  Of unclear etiology  MELD=28 Extubated  #2 acute variceal bleed- banded - day #6 Rocephin, SBP prophylaxis Will need repeat EGD in 3-4 weeks #3  hepatic encephalopathy-- nonverbal today but alert- continue Xifaxan,, increase Chronulac #4 Ascites-no SBP- may need repeat tap in a few days Active Problems:   DEPRESSIVE DISORDER, RCR, MODERATE   SEIZURE, GRAND MAL   HYPERTENSION   GERD   Liver cirrhosis   Substance abuse   Nicotine abuse   Hypokalemia   Thrombocytopenia, unspecified   Anemia, unspecified   Respiratory failure   Anemia, blood loss   Hepatic encephalopathy   Upper GI bleed   Ascites   Acute respiratory failure with hypoxemia   Coagulopathy   Acute blood loss anemia   Thrombocytopenia   Acute renal failure     LOS: 5 days   [name]  05/27/2014, 2:46 PM

## 2014-05-27 NOTE — Progress Notes (Signed)
PULMONARY / CRITICAL CARE MEDICINE   Name: Dana Petersen MRN: [id number] DOB: 10-24-1966    ADMISSION DATE:  05/22/2014   REFERRING MD :  EDP PRIMARY SERVICE: PCCM  BRIEF PATIENT DESCRIPTION: [age] F with cirrhosis of unclear etiology [facility] with hematemesis and decreased LOC. [facility]. Ad  SIGNIFICANT EVENTS / STUDIES:  6/22 - admitted with UGIB, AMS. Intubated in ED. GI consult. Octreotide, ceftriaxone initiated. Therapeutic paracentesis recommended. Mild coagulopathy > 2 units FFP administered. Hg 8.5 > one unit PRBCs. Plts 74K 6/22 - CT head: NAD 6/22 - EGD [facility]([place]): large mid to distal esophageal varices. Portal htn gastropathy. 7 bands placed 6/24 - weaning on PSV, hypokalemia / hypomagnesemia  6/25 - extubated, reintubated 6/27 - More alert, tolerating wean  LINES / TUBES: ETT6/22  >> 6/25, 6/25 >>  L IJ CVL 6/22 >>  Panda 6/25 >>   CULTURES: UC 6/22 >> negative Resp  6/22 >> mod staph >> MRSA Blood 6/22 >> canceled / not drawn Peritoneal Fluid 6/22 >> rare WBC >> negative  ANTIBIOTICS: Ceftriaxone (SBP px) 6/22 >> (plan d/c 6/28)  SUBJECTIVE:   More alert, tolerating 5/5 wean.  Has had intermittent small volume bile colored emesis.     VITAL SIGNS: Temp:  [98.1 F (36.7 C)-99 F (37.2 C)] 98.9 F (37.2 C) (06/27 0800) Pulse Rate:  [95-119] 108 (06/27 0800) Resp:  [10-28] 14 (06/27 0800) BP: (127-160)/(62-84) 140/67 mmHg (06/27 0800) SpO2:  [98 %-100 %] 100 % (06/27 0800) FiO2 (%):  [30 %] 30 % (06/27 0854) Weight:  [191 lb 5.8 oz (86.8 kg)] 191 lb 5.8 oz (86.8 kg) (06/27 0100)  HEMODYNAMICS: CVP:  [6 mmHg-7 mmHg] 7 mmHg  VENTILATOR SETTINGS: Vent Mode:  [-] CPAP;PSV FiO2 (%):  [30 %] 30 % Set Rate:  [12 bmp] 12 bmp Vt Set:  [500 mL] 500 mL PEEP:  [5 cmH20] 5 cmH20 Pressure Support:  [8 cmH20-10 cmH20] 8 cmH20 Plateau Pressure:  [11 cmH20-19 cmH20] 12  cmH20  INTAKE / OUTPUT: Intake/Output     06/26 0701 - 06/27 0700 06/27 0701 - 06/28 0700   I.V. (mL/kg) 1175 (13.5) 50 (0.6)   NG/GT 420 30   IV Piggyback 50    Total Intake(mL/kg) 1645 (19) 80 (0.9)   Urine (mL/kg/hr) 397 (0.2) 45 (0.3)   Emesis/NG output 1 (0)    Stool 1100 (0.5) 100 (0.6)   Total Output 1498 145   Net +147 -65          PHYSICAL EXAMINATION: General: chronically ill in NAD Neuro: drowsy, easily arouses, MAEs, no focal deficits noted HEENT: mild sclericterus, otherwise WNL Cardiovascular: RRR s M Lungs: Clear anteriorly, no wheezes Abdomen: round / less distended, bsx4 active Ext: symmetric LE edema, multiple ecchymoses Skin: multiple telangiectasias on chest, arms  LABS: PULMONARY  Recent Labs Lab 05/22/14 0522 05/22/14 0859 05/23/14 0430 05/25/14 1533  PHART  --  7.385 7.403 7.305*  PCO2ART  --  38.1 30.2* 34.8*  PO2ART  --  550.0* 133.0* 178.0*  HCO3  --  22.8 18.5* 16.8*  TCO2 19 24 19.5 17.9  O2SAT  --  100.0 99.2 100.0    CBC  Recent Labs Lab 05/24/14 0500 05/25/14 0500 05/26/14 0500  HGB 7.3* 7.5* 8.0*  HCT 22.4* 22.9* 24.4*  WBC 4.4 6.7 6.5  PLT 30* 34* 43*    COAGULATION  Recent Labs Lab 05/22/14 0442 05/23/14 0450 05/24/14  0500 05/25/14 1100 05/26/14 0500  INR 1.71* 1.67* 2.02* 1.91* 1.92*    CARDIAC  No results found for this basename: TROPONINI,  in the last 168 hours No results found for this basename: PROBNP,  in the last 168 hours   CHEMISTRY  Recent Labs Lab 05/23/14 0450 05/24/14 0500 05/24/14 1615 05/25/14 0500 05/26/14 0500  NA 139 145 144 146 145  K 3.1* 2.9* 3.3* 3.0* 3.6*  CL 97 106 106 109 108  CO2 18* 17* 17* 16* 16*  GLUCOSE 113* 109* 111* 105* 175*  BUN 44* 41* 38* 38* 38*  CREATININE 4.04* 3.77* 3.64* 3.44* 3.43*  CALCIUM 6.8* 6.3* 6.4* 6.6* 7.8*  MG  --  0.9* 1.3*  --   --   PHOS  --  4.7*  --   --   --    Estimated Creatinine Clearance: 21.4 ml/min (by C-G formula based on Cr  of 3.43).   LIVER  Recent Labs Lab 05/22/14 0442 05/23/14 0450 05/24/14 0500 05/25/14 1100 05/26/14 0500 05/26/14 0930  AST 78*  --   --   --   --  43*  ALT 25  --   --   --   --  17  ALKPHOS 224*  --   --   --   --  157*  BILITOT 3.1*  --   --   --   --  2.1*  PROT 7.8  --   --   --   --  6.3  ALBUMIN 2.9*  --   --   --   --  2.7*  INR 1.71* 1.67* 2.02* 1.91* 1.92*  --      INFECTIOUS  Recent Labs Lab 05/22/14 0503 05/22/14 1155  LATICACIDVEN 3.77*  --   PROCALCITON  --  0.30     ENDOCRINE CBG (last 3)  No results found for this basename: GLUCAP,  in the last 72 hours       IMAGING x48h  Dg Chest Port 1 View  05/26/2014   CLINICAL DATA:  Assess airspace disease.  EXAM: PORTABLE CHEST - 1 VIEW  COMPARISON:  05/25/2014  FINDINGS: Enteric tube courses through the stomach and off the inferior portion of the film. Endotracheal tube has been pulled back with tip approximately 3.7 cm above the carina. Left IJ central venous catheter is unchanged. Lungs are hypoinflated without focal consolidation, effusion or pneumothorax. The cardiomediastinal silhouette and remainder of the exam is unchanged.  IMPRESSION: Hypoinflation without acute cardiopulmonary disease.  Tubes and lines as described.   Electronically Signed   By: Elberta Fortis M.D.   On: 05/26/2014 08:02   Dg Chest Port 1 View  05/25/2014   CLINICAL DATA:  Respiratory failure. Replacement of endotracheal tube and new feeding tube insertion.  EXAM: PORTABLE CHEST - 1 VIEW 2:57 p.m.  COMPARISON:  05/25/2014 at 4:53 a.m.  FINDINGS: Endotracheal tube tip is 7 mm above the carina. Dr. Delton Coombes was informed.  Feeding tube tip is below the diaphragm. Left jugular vein catheter tip is in the superior vena cava at the cavoatrial junction in good position.  Heart size and vascularity are normal. Lungs are clear except for a minimal atelectasis at the left lung base. No acute osseous abnormality.  IMPRESSION: Minimal atelectasis at  the left lung base. Slightly low-lying endotracheal tube.   Electronically Signed   By: Geanie Cooley M.D.   On: 05/25/2014 15:18   Dg Abd Portable 1v  05/25/2014   CLINICAL DATA:  Dobbhoff tube placement.  EXAM: PORTABLE ABDOMEN - 1 VIEW  COMPARISON:  None.  FINDINGS: Dobbhoff type tube noted with tip projected over distal stomach. No bowel distention. No free air.  IMPRESSION: Dobbhoff type tube noted with tip projected over distal stomach.   Electronically Signed   By: Maisie Fus  Register   On: 05/25/2014 13:25       ASSESSMENT / PLAN:  PULMONARY A: Acute resp failure - due to metabolic/ hepatic encephalopathy P:   Mobilize as able O2 to keep sats > 92% Meets extubation criteria 6/27, will proceed with extubation  CARDIOVASCULAR A:  H/O HTN P:  Hold antihypertensives Monitor BP and rhythm  RENAL A:  Acute renal failure (baseline Cr 0.9) - relative hypovolemia (vs hepatorenal - doubt) Metabolic acidosis - elevated lactate due to impaired clearance Hypokalemia Hypomagnesemia P:   Monitor BMET intermittently Monitor I/Os Correct electrolytes as indicated Maintain MAP > 70 mmHg.  Maintain CVP > 10 with volume Change IVF to D51/2 NS with 40 KCL @ 50 ml/hr   GASTROINTESTINAL A:  ESLD, cirrhosis - etiology unclear, previous eval by GI 2009 - thought to be alcoholic. Family denies prior alcoholism.  Prior negative autoimmune work up  UGIB due to esophageal variceal bleeding S/P EGD, variceal banding Large to moderate ascites P:   SUP: PPI Octreotide infusion, ended 6/25 Follow fluid culture (para 6/23) GI Rec > no placement of NGT, ok for liquids / pills as mental & respiratory status tolerate; I believe she will need NGT placed to allow lactulose at least short term. The benefit of not needing to reintubate would seem to outweigh the risks of bleeding with panda placement Once stabilized and moving towards discharge, need to address impending need for liver transplantation    Consider trickle feeds this pm, would not advance yet due to vomiting   HEMATOLOGIC A:   Acute blood loss anemia Coagulopathic (secondary to liver disease)  Thrombocytopenia P:  DVT px: SCDs If actively bleeding, transfuse FFP to maintain INR < 1.5 If actively bleeding, transfuse plts to maintain > 75 - 100 K If actively bleeding, transfuse PRBCs to maintain Hgb > 8.5 and hemodynamic stability  Monitor CBC   INFECTIOUS A:   At risk for SBP in setting of variceal bleeding P:   Micro and abx as above, plan 7 days ceftriaxone total Follow ascitic fluid for cx (even though she has been on abx)  ENDOCRINE A:  H/O DM II Risk of hypoglycemia in setting of renal and hepatic failure P:   Monitor CBG q 4 hrs Initiate SSI for glu > 180 Monitor Glucose D51/2NS c 40 kcl at 50   NEUROLOGIC A:   Hepatic encephalopathy History of prior seizure though due to alprazolam withdrawal Chronic alprazolam use  P:   RASS goal: N/A Scheduled Lactulose and xifaxan   Canary Brim ACNP 05/27/2014, 9:02 AM [facility] [phone] or if no answer [phone]   STAFF NOTE: I, Dr Lavinia Sharps have personally reviewed patient's available data, including medical history, events of note, physical examination and test results as part of my evaluation. I have discussed with resident/NP and other care providers such as pharmacist, RN and RRT.  In addition,  I personally evaluated patient and elicited key findings of cirrhosis liver, hepatic encephalopathy that is improved after panda based lactulose that has allowed for extubation today. Husband appears lost,  And admits he is overwhelmed by her illness and appears to be in spiritual distress. Will call  chaplain. Keep in ICU. She is still very debilitated.  Rest per NP/medical resident whose note is outlined above and that I agree with  The patient is critically ill with multiple organ systems failure and requires high complexity decision  making for assessment and support, frequent evaluation and titration of therapies, application of advanced monitoring technologies and extensive interpretation of multiple databases.   Critical Care Time devoted to patient care services described in this note is  35  Minutes.  Dr. Kalman ShanMurali Trevelle Mcgurn, M.D., [facility]C.C.P Pulmonary and Critical Care Medicine Staff Physician [facility] Pager: [phone], If no answer or between  15:00h - 7:00h: call [phone]  05/27/2014 12:11 PM

## 2014-05-27 NOTE — Procedures (Signed)
Extubation Procedure Note  Patient Details:   Name: Dana Petersen DOB: 1966/06/29 MRN: [id number]   Airway Documentation:  Airway 7.5 mm (Active)  Secured at (cm) 20 cm 05/27/2014  9:13 AM  Measured From Lips 05/27/2014  9:13 AM  Secured Location Right 05/27/2014  9:13 AM  Secured By [employer] Tube Holder 05/27/2014  9:13 AM  Tube Holder Repositioned Yes 05/27/2014  8:00 AM  Cuff Pressure (cm H2O) 22 cm H2O 05/26/2014  8:37 AM  Site Condition Dry 05/27/2014  8:00 AM    Evaluation  O2 sats: stable throughout Complications: No apparent complications Patient did tolerate procedure well. Bilateral Breath Sounds: Clear;Diminished Suctioning: Airway Yes Patient extubated to 4L . Husband at bedside  Bonney LeitzFowler, Arielle C 05/27/2014, 11:36 AM

## 2014-05-28 ENCOUNTER — Inpatient Hospital Stay (HOSPITAL_COMMUNITY): Payer: Medicaid Other

## 2014-05-28 LAB — MAGNESIUM: Magnesium: 1.5 mg/dL (ref 1.5–2.5)

## 2014-05-28 LAB — CBC
HEMATOCRIT: 25.8 % — AB (ref 36.0–46.0)
HEMOGLOBIN: 8.2 g/dL — AB (ref 12.0–15.0)
MCH: 30.8 pg (ref 26.0–34.0)
MCHC: 31.8 g/dL (ref 30.0–36.0)
MCV: 97 fL (ref 78.0–100.0)
Platelets: 39 10*3/uL — ABNORMAL LOW (ref 150–400)
RBC: 2.66 MIL/uL — ABNORMAL LOW (ref 3.87–5.11)
RDW: 21.1 % — ABNORMAL HIGH (ref 11.5–15.5)
WBC: 5.1 10*3/uL (ref 4.0–10.5)

## 2014-05-28 LAB — BASIC METABOLIC PANEL
BUN: 35 mg/dL — AB (ref 6–23)
CHLORIDE: 116 meq/L — AB (ref 96–112)
CO2: 17 meq/L — AB (ref 19–32)
Calcium: 8.4 mg/dL (ref 8.4–10.5)
Creatinine, Ser: 3.61 mg/dL — ABNORMAL HIGH (ref 0.50–1.10)
GFR calc Af Amer: 16 mL/min — ABNORMAL LOW (ref >90)
GFR calc non Af Amer: 14 mL/min — ABNORMAL LOW (ref >90)
Glucose, Bld: 144 mg/dL — ABNORMAL HIGH (ref 70–99)
Potassium: 3.8 mEq/L (ref 3.7–5.3)
Sodium: 152 mEq/L — ABNORMAL HIGH (ref 137–147)

## 2014-05-28 LAB — SODIUM, URINE, RANDOM: Sodium, Ur: <20 mEq/L

## 2014-05-28 MED ORDER — ALBUMIN HUMAN 25 % IV SOLN
125.0000 g | Freq: Once | INTRAVENOUS | Status: AC
  Administered 2014-05-28: 125 g via INTRAVENOUS
  Filled 2014-05-28: qty 500

## 2014-05-28 MED ORDER — MAGNESIUM SULFATE 40 MG/ML IJ SOLN
2.0000 g | Freq: Once | INTRAMUSCULAR | Status: AC
  Administered 2014-05-28: 2 g via INTRAVENOUS
  Filled 2014-05-28 (×2): qty 50

## 2014-05-28 MED ORDER — PANTOPRAZOLE SODIUM 40 MG PO PACK
40.0000 mg | PACK | Freq: Two times a day (BID) | ORAL | Status: DC
  Administered 2014-05-28 – 2014-05-31 (×7): 40 mg
  Filled 2014-05-28 (×10): qty 20

## 2014-05-28 NOTE — Progress Notes (Addendum)
PULMONARY / CRITICAL CARE MEDICINE   Name: Dana MatterMelissa B Kessenich MRN: [id number] DOB: 1966-06-12    ADMISSION DATE:  05/22/2014   REFERRING MD :  EDP PRIMARY SERVICE: PCCM  BRIEF PATIENT DESCRIPTION: [age] F with cirrhosis of unclear etiology [facility] AM with hematemesis and decreased LOC. [facility]. Ad   LINES / TUBES: ETT6/22  >> 6/25, 6/25 >> 6/27 L IJ CVL 6/22 >>  Panda 6/25 >>   CULTURES: UC 6/22 >> negative Resp  6/22 >> mod staph >> MRSA Blood 6/22 >> canceled / not drawn Peritoneal Fluid 6/22 >> rare WBC >> negative  ANTIBIOTICS: Ceftriaxone (SBP px) 6/22 >> (plan d/c 6/28)    SIGNIFICANT EVENTS / STUDIES:  6/22 - admitted with UGIB, AMS. Intubated in ED. GI consult. Octreotide, ceftriaxone initiated. Therapeutic paracentesis recommended. Mild coagulopathy > 2 units FFP administered. Hg 8.5 > one unit PRBCs. Plts 74K 6/22 - CT head: NAD 6/22 - EGD [facility]([place]): large mid to distal esophageal varices. Portal htn gastropathy. 7 bands placed. Will need repeat EGD 3-4 weeks 6/24 - weaning on PSV, hypokalemia / hypomagnesemia  6/25 - extubated, reintubated 6/27 - More alert, tolerating wean 6/28 - Improved mental status (able to state name / place), stronger resp status. MELD score 28 per GI    SUBJECTIVE:   Extubated 6/27, tolerating well.  Improved mental status from 6/27     VITAL SIGNS: Temp:  [98.4 F (36.9 C)-99.4 F (37.4 C)] 98.5 F (36.9 C) (06/28 0719) Pulse Rate:  [80-114] 101 (06/28 0700) Resp:  [7-16] 8 (06/28 0700) BP: (95-156)/(42-101) 132/71 mmHg (06/28 0700) SpO2:  [97 %-100 %] 100 % (06/28 0700) FiO2 (%):  [30 %] 30 % (06/27 0956) Weight:  [190 lb 0.6 oz (86.2 kg)] 190 lb 0.6 oz (86.2 kg) (06/28 0300)  HEMODYNAMICS: CVP:  [7 mmHg] 7 mmHg  VENTILATOR SETTINGS: Vent Mode:  [-] PRVC FiO2 (%):  [30 %] 30 % Set Rate:  [12 bmp] 12 bmp PEEP:  [5 cmH20] 5 cmH20 Pressure  Support:  [5 cmH20-10 cmH20] 5 cmH20  INTAKE / OUTPUT: Intake/Output     06/27 0701 - 06/28 0700 06/28 0701 - 06/29 0700   I.V. (mL/kg) 1125 (13.1)    NG/GT 650    IV Piggyback 50    Total Intake(mL/kg) 1825 (21.2)    Urine (mL/kg/hr) 410 (0.2)    Emesis/NG output     Stool 1200 (0.6)    Total Output 1610     Net +215          Emesis Occurrence 1 x      PHYSICAL EXAMINATION: General: chronically ill in NAD Neuro: awake, hoarse voice, looks better,  MAEs, no focal deficits noted HEENT: mild sclericterus, otherwise WNL Cardiovascular: RRR s M Lungs: Resp's even/non-labore, lungs clear anteriorly, no wheezes Abdomen: round / distended, bsx4 active Ext: symmetric LE edema, multiple ecchymoses Skin: multiple telangiectasias on chest, arms  LABS: PULMONARY  Recent Labs Lab 05/22/14 0522 05/22/14 0859 05/23/14 0430 05/25/14 1533  PHART  --  7.385 7.403 7.305*  PCO2ART  --  38.1 30.2* 34.8*  PO2ART  --  550.0* 133.0* 178.0*  HCO3  --  22.8 18.5* 16.8*  TCO2 19 24 19.5 17.9  O2SAT  --  100.0 99.2 100.0   CBC  Recent Labs Lab 05/25/14 0500 05/26/14 0500 05/28/14 0359  HGB 7.5* 8.0* 8.2*  HCT 22.9* 24.4* 25.8*  WBC 6.7 6.5 5.1  PLT 34* 43* 39*   COAGULATION  Recent Labs Lab 05/22/14 0442 05/23/14 0450 05/24/14 0500 05/25/14 1100 05/26/14 0500  INR 1.71* 1.67* 2.02* 1.91* 1.92*   CARDIAC  No results found for this basename: TROPONINI,  in the last 168 hours No results found for this basename: PROBNP,  in the last 168 hours  CHEMISTRY  Recent Labs Lab 05/23/14 0450 05/24/14 0500 05/24/14 1615 05/25/14 0500 05/26/14 0500 05/28/14 0359  NA 139 145 144 146 145 152*  K 3.1* 2.9* 3.3* 3.0* 3.6* 3.8  CL 97 106 106 109 108 116*  CO2 18* 17* 17* 16* 16* 17*  GLUCOSE 113* 109* 111* 105* 175* 144*  BUN 44* 41* 38* 38* 38* 35*  CREATININE 4.04* 3.77* 3.64* 3.44* 3.43* 3.61*  CALCIUM 6.8* 6.3* 6.4* 6.6* 7.8* 8.4  MG  --  0.9* 1.3*  --   --  1.5  PHOS   --  4.7*  --   --   --   --    Estimated Creatinine Clearance: 20.2 ml/min (by C-G formula based on Cr of 3.61).  LIVER  Recent Labs Lab 05/22/14 0442 05/23/14 0450 05/24/14 0500 05/25/14 1100 05/26/14 0500 05/26/14 0930  AST 78*  --   --   --   --  43*  ALT 25  --   --   --   --  17  ALKPHOS 224*  --   --   --   --  157*  BILITOT 3.1*  --   --   --   --  2.1*  PROT 7.8  --   --   --   --  6.3  ALBUMIN 2.9*  --   --   --   --  2.7*  INR 1.71* 1.67* 2.02* 1.91* 1.92*  --    INFECTIOUS  Recent Labs Lab 05/22/14 0503 05/22/14 1155  LATICACIDVEN 3.77*  --   PROCALCITON  --  0.30   ENDOCRINE CBG (last 3)  No results found for this basename: GLUCAP,  in the last 72 hours   IMAGING x48h  Dg Chest Port 1 View  05/28/2014   CLINICAL DATA:  check ETT  EXAM: PORTABLE CHEST - 1 VIEW  COMPARISON:  05/26/2014  FINDINGS: Patient has been extubated  Feeding tube and left IJ central line stable position  Low volumes with some patchy subsegmental atelectasis in the lung bases right greater than left. No effusion. Visualized skeletal structures are unremarkable.  IMPRESSION: 1. Extubation.  Otherwise stable appearance since previous exam.   Electronically Signed   By: Oley Balm M.D.   On: 05/28/2014 07:15    ASSESSMENT / PLAN:  PULMONARY A: Acute resp failure - due to metabolic/ hepatic encephalopathy. Sp extubation 05/27/14   - doing well off vent  P:   Mobilize as able O2 to keep sats > 92% Aggressive pulmonary hygiene   CARDIOVASCULAR A:  H/O HTN P:  Hold antihypertensives Monitor BP and rhythm  RENAL A:  Acute renal failure (baseline Cr 0.9) - relative hypovolemia (vs hepatorenal - doubt) Metabolic acidosis - elevated lactate due to impaired clearance Hypokalemia Hypomagnesemia   - creat improved but now hovering around 3.6. Ur Na sent off by GI. GI giving some albumin empirically. Hyaline casts on UA suggests ATN  P:   Replete Mag Monitor BMET  intermittently, Monitor I/Os Correct electrolytes as indicated Maintain MAP > 65 mmHg and Maintain CVP > 10 with volume IVF to D51/2 NS with 40 KCL @  50 ml/hr [place] per GI If creat worsening with fluids, might need renal consult ? 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 Family denies prior alcoholism.  Prior negative autoimmune work up.  Marland Kitchen. UGIB due to esophageal variceal bleeding  - S/P EGD, variceal banding 05/22/14. Octreotide infusion ended 6/25 Large to moderate ascites - Negative for SBP as recently as 05/23/14   - improving hepatic encephalopathy P:   SUP: PPI Lactulose and Rifaximin via panda (panda benefit outweighs risk). Needs EGD 3-4 weeks from 05/22/14 Liver transplant eval at some point   HEMATOLOGIC A:   Acute blood loss anemia Coagulopathic (secondary to liver disease)  Thrombocytopenia P:  DVT px: SCDs If actively bleeding, transfuse FFP to maintain INR < 1.5 If actively bleeding, transfuse plts to maintain > 75 - 100 K If actively bleeding, transfuse PRBCs to maintain Hgb >7gm% (NEJM feb 2013)  and hemodynamic stability  Monitor CBC   INFECTIOUS A:   At risk for SBP in setting of variceal bleeding - negative fluid / cultures from para 6/23 P:   Micro and abx as above, plan 7 days ceftriaxone total Follow ascitic fluid for cx (even though she has been on abx)  ENDOCRINE A:  H/O DM II Risk of hypoglycemia in setting of renal and hepatic failure P:   Monitor CBG q 4 hrs Initiate SSI for glu > 180 D51/2NS c 40 kcl at 50   NEUROLOGIC A:   Hepatic encephalopathy - improving on oral lactulose & xifaxan History of prior seizure though due to alprazolam withdrawal Chronic alprazolam use    -persistent but improving encephalopathy P:   RASS goal: N/A Scheduled Lactulose and xifaxan     Canary BrimBrandi Ollis, NP-C [place] [facility] &  Critical Care Pgr: [phone] or [phone] 05/28/2014, 7:53 AM   STAFF NOTE: I, Dr Lavinia SharpsM Ramaswamy have personally reviewed patient's available data, including medical history, events of note, physical examination and test results as part of my evaluation. I have discussed with resident/NP and other care providers such as pharmacist, RN and RRT.  In addition,  I personally evaluated patient and elicited key findings of  Acute resp failure - now well after extubation yesterday. She has improving but persistent hepatic encephalopathy; needs to stay in ICU. Based on child C cirrhosis and MELD 28 she is at high risk for deterioration and death anytime./ D/w Dr Rhea BeltonPyrtle at bedside.  Rest per NP/medical resident whose note is outlined above and that I agree with  The patient is critically ill with multiple organ systems failure and requires high complexity decision making for assessment and support, frequent evaluation and titration of therapies, application of advanced monitoring technologies and extensive interpretation of multiple databases.   Critical Care Time devoted to patient care services described in this note is  30  Minutes.  Dr. Kalman ShanMurali Ramaswamy, M.D., [facility].C.P Pulmonary and Critical Care Medicine Staff Physician [facility] [place] [facility] Pager: [phone], If no answer or between  15:00h - 7:00h: call [phone]  05/28/2014 2:23 PM

## 2014-05-28 NOTE — Progress Notes (Signed)
Progress Note   Subjective  Patient remains extubated Able to tell me who she is aware she is today Moving all extremities No further bleeding, no fevers Low urine output   Objective   Vital signs in last 24 hours: Temp:  [98.4 F (36.9 C)-99.4 F (37.4 C)] 98.5 F (36.9 C) (06/28 0719) Pulse Rate:  [80-114] 98 (06/28 1100) Resp:  [7-16] 9 (06/28 1100) BP: (95-156)/(42-81) 126/59 mmHg (06/28 1100) SpO2:  [96 %-100 %] 98 % (06/28 1100) Weight:  [190 lb 0.6 oz (86.2 kg)] 190 lb 0.6 oz (86.2 kg) (06/28 0300) Last BM Date: 05/28/14 General:    white female in NAD Heart:  Regular rate and rhythm; no murmurs Lungs: Respirations even and unlabored, lungs CTA bilaterally Abdomen:  Soft, nontender and nondistended. Normal bowel sounds. Extremities:  Without edema. Neurologic:  Alert and oriented,  grossly normal neurologically. Psych:  Cooperative. Normal mood and affect.  Lab Results:  Recent Labs  05/26/14 0500 05/28/14 0359  WBC 6.5 5.1  HGB 8.0* 8.2*  HCT 24.4* 25.8*  PLT 43* 39*   BMET  Recent Labs  05/26/14 0500 05/28/14 0359  NA 145 152*  K 3.6* 3.8  CL 108 116*  CO2 16* 17*  GLUCOSE 175* 144*  BUN 38* 35*  CREATININE 3.43* 3.61*  CALCIUM 7.8* 8.4   LFT  Recent Labs  05/26/14 0930  PROT 6.3  ALBUMIN 2.7*  AST 43*  ALT 17  ALKPHOS 157*  BILITOT 2.1*  BILIDIR 1.3*  IBILI 0.8   PT/INR  Recent Labs  05/26/14 0500  LABPROT 22.0*  INR 1.92*    Studies/Results: Dg Chest Port 1 View  05/28/2014   CLINICAL DATA:  check ETT  EXAM: PORTABLE CHEST - 1 VIEW  COMPARISON:  05/26/2014  FINDINGS: Patient has been extubated  Feeding tube and left IJ central line stable position  Low volumes with some patchy subsegmental atelectasis in the lung bases right greater than left. No effusion. Visualized skeletal structures are unremarkable.  IMPRESSION: 1. Extubation.  Otherwise stable appearance since previous exam.   Electronically Signed   By: Oley Balmaniel   Hassell M.D.   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    Can d/c Rocephin now (on day 7) given for SBP prevention. Continue BID IV PPI . Will need repeat EGD with possible banding in 3-4 weeks.   PSE- getting Xifaxan and lactulose through panda tube.    Ascites, s/p 10,000 liters this admission. She has anasarca / abdominal wall edema but probably reaccumulating ascitic fluid as well. Will check urine sodium and give a dose of Albumin today, 1.5mg /kg. Consider nephrology consult to see if diuretics will help or cause further deterioration   Coagulopathy, INR 1.92 at last check 2 days ago (post Vit K)  HCC surveillance. AFP normal, need eventual imaging study if stablizes  Thrombocytopenia, severe. .  2.  AKI, progressive. Creatinine up further to 3.61. Diuretics on hold. Urine output only 400cc yesterday. Consider renal consult consult to see if diuretics can be given as she has massive anasarca and probably reaccumulating ascitic fluid. Will check urine sodium. ATN vrs HRS.   4. Anemia of acute blood loss, hgb stable at 8.2    LOS: 6 days   Willette Clusteraula Guenther  05/28/2014, 11:34 AM  Addendum Patient seen, examined, and I agree with the above documentation, including the assessment and plan. Urine sodium  pending, IV albumin being given Nephrology consult placed Remains critically ill with poor prognosis

## 2014-05-29 ENCOUNTER — Inpatient Hospital Stay (HOSPITAL_COMMUNITY): Payer: Medicaid Other

## 2014-05-29 DIAGNOSIS — N170 Acute kidney failure with tubular necrosis: Secondary | ICD-10-CM

## 2014-05-29 LAB — CBC
HCT: 21.8 % — ABNORMAL LOW (ref 36.0–46.0)
Hemoglobin: 7 g/dL — ABNORMAL LOW (ref 12.0–15.0)
MCH: 31.5 pg (ref 26.0–34.0)
MCHC: 32.1 g/dL (ref 30.0–36.0)
MCV: 98.2 fL (ref 78.0–100.0)
Platelets: 28 10*3/uL — CL (ref 150–400)
RBC: 2.22 MIL/uL — AB (ref 3.87–5.11)
RDW: 21.5 % — ABNORMAL HIGH (ref 11.5–15.5)
WBC: 3.1 10*3/uL — ABNORMAL LOW (ref 4.0–10.5)

## 2014-05-29 LAB — BASIC METABOLIC PANEL
BUN: 33 mg/dL — ABNORMAL HIGH (ref 6–23)
BUN: 33 mg/dL — AB (ref 6–23)
BUN: 33 mg/dL — AB (ref 6–23)
CHLORIDE: 122 meq/L — AB (ref 96–112)
CO2: 18 meq/L — AB (ref 19–32)
CO2: 16 mEq/L — ABNORMAL LOW (ref 19–32)
CO2: 18 mEq/L — ABNORMAL LOW (ref 19–32)
CREATININE: 3.57 mg/dL — AB (ref 0.50–1.10)
CREATININE: 3.63 mg/dL — AB (ref 0.50–1.10)
CREATININE: 3.6 mg/dL — AB (ref 0.50–1.10)
Calcium: 9.1 mg/dL (ref 8.4–10.5)
Calcium: 9 mg/dL (ref 8.4–10.5)
Calcium: 8.9 mg/dL (ref 8.4–10.5)
Chloride: 123 mEq/L — ABNORMAL HIGH (ref 96–112)
Chloride: 125 mEq/L — ABNORMAL HIGH (ref 96–112)
GFR calc Af Amer: 16 mL/min — ABNORMAL LOW (ref >90)
GFR calc Af Amer: 16 mL/min — ABNORMAL LOW (ref >90)
GFR calc non Af Amer: 14 mL/min — ABNORMAL LOW (ref >90)
GFR, EST AFRICAN AMERICAN: 16 mL/min — AB (ref >90)
GFR, EST NON AFRICAN AMERICAN: 14 mL/min — AB (ref >90)
GFR, EST NON AFRICAN AMERICAN: 14 mL/min — AB (ref >90)
GLUCOSE: 132 mg/dL — AB (ref 70–99)
Glucose, Bld: 125 mg/dL — ABNORMAL HIGH (ref 70–99)
Glucose, Bld: 127 mg/dL — ABNORMAL HIGH (ref 70–99)
Potassium: 3.9 mEq/L (ref 3.7–5.3)
Potassium: 3.7 mEq/L (ref 3.7–5.3)
Potassium: 3.5 mEq/L — ABNORMAL LOW (ref 3.7–5.3)
SODIUM: 159 meq/L — AB (ref 137–147)
Sodium: 157 mEq/L — ABNORMAL HIGH (ref 137–147)
Sodium: 159 mEq/L — ABNORMAL HIGH (ref 137–147)

## 2014-05-29 LAB — GLUCOSE, CAPILLARY
Glucose-Capillary: 108 mg/dL — ABNORMAL HIGH (ref 70–99)
Glucose-Capillary: 114 mg/dL — ABNORMAL HIGH (ref 70–99)
Glucose-Capillary: 117 mg/dL — ABNORMAL HIGH (ref 70–99)

## 2014-05-29 MED ORDER — DEXTROSE 5 % IV SOLN
INTRAVENOUS | Status: DC
Start: 2014-05-29 — End: 2014-05-30
  Administered 2014-05-29: 14:00:00 via INTRAVENOUS

## 2014-05-29 MED ORDER — ENSURE COMPLETE PO LIQD: 237.0000 mL | Freq: Two times a day (BID) | ORAL | Status: DC

## 2014-05-29 MED ORDER — GLUCERNA SHAKE PO LIQD
237.0000 mL | Freq: Two times a day (BID) | ORAL | Status: DC
  Administered 2014-05-29 – 2014-05-31 (×2): 237 mL via ORAL

## 2014-05-29 NOTE — Progress Notes (Signed)
Progress Note   Subjective  awake, knows she is at hospital. Wants something to drink   Objective   Vital signs in last 24 hours: Temp:  [98 F (36.7 C)-98.6 F (37 C)] 98.1 F (36.7 C) (06/29 0859) Pulse Rate:  [84-132] 132 (06/29 0600) Resp:  [8-21] 15 (06/29 0600) BP: (67-135)/(45-75) 114/75 mmHg (06/29 0600) SpO2:  [97 %-100 %] 100 % (06/29 0600) Weight:  [187 lb 9.8 oz (85.1 kg)] 187 lb 9.8 oz (85.1 kg) (06/29 0453) Last BM Date: 05/28/14 General:    white female in NAD Heart:  tachycardic Abdomen:  Soft, nontender, markedly distended. Abdominal wall edema.  Normal bowel sounds. Extremities:  Bilateral lower extremity edema. Neurologic:  Alert , oriented to person, place. Difficult to examine but still with some asterixis.  Psych:  Cooperative.    Lab Results:  Recent Labs  05/28/14 0359 05/29/14 0504  WBC 5.1 3.1*  HGB 8.2* 7.0*  HCT 25.8* 21.8*  PLT 39* 28*   BMET  Recent Labs  05/28/14 0359 05/29/14 0504  NA 152* 159*  K 3.8 3.9  CL 116* 122*  CO2 17* 18*  GLUCOSE 144* 132*  BUN 35* 33*  CREATININE 3.61* 3.57*  CALCIUM 8.4 9.1   LFT  Recent Labs  05/26/14 0930  PROT 6.3  ALBUMIN 2.7*  AST 43*  ALT 17  ALKPHOS 157*  BILITOT 2.1*  BILIDIR 1.3*  IBILI 0.8    Studies/Results: Dg Chest Port 1 View  05/29/2014   CLINICAL DATA:  Acute respiratory failure. Status post extubation 05/27/2014.  EXAM: PORTABLE CHEST - 1 VIEW  COMPARISON:  Single view of the chest 05/28/2014 and 05/26/2014.  FINDINGS: Feeding tube and left IJ catheter remain in place, unchanged. Lung volumes are low but the lungs are clear. Heart size is normal. There is no pneumothorax or pleural effusion.  IMPRESSION: No acute finding in a low volume chest.   Electronically Signed   By: Drusilla Kannerhomas  Dalessio M.D.   On: 05/29/2014 07:53     Assessment / Plan:   1. 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  Continue QD PPI per tube . Will need  repeat EGD with possible banding in 3-4 weeks.  PSE- getting Xifaxan and lactulose through panda tube. Now that extubated and more alert will hopefully take PO soon. Thirsty, will see how she does with ice chips Ascites, s/p 10,000 liters this admission. She has anasarca / abdominal wall edema but probably reaccumulating ascitic fluid as well. She got dose of IV albumin yesterday.   Coagulopathy, INR 1.92 at last check 3 days ago (post Vit K)  HCC surveillance. AFP normal, need eventual imaging study if stablizes  Thrombocytopenia, severe. .  2. AKI, Slight improvement of creatinine over night. Diuretics still on hold. Urine output only 397cc yesterday. ATN? HRS? Her urine sodium is not less than 10 but is "less than 20", Renal to see her in consult.    4. Anemia of acute blood loss, hgb down overnight from 8.2 to 7.0.   5. Hypernatremia, Na+ 159.    LOS: 7 days   Willette Clusteraula Guenther  05/29/2014, 9:08 AM   GI ATTENDING  Interval data and history reviewed. Case reviewed at GI morning report. Patient personally seen and examined. Husband, Brett CanalesSteve in room. Agree with H&P as above. Critically ill 48 year old with end-stage liver disease complicated by acute variceal bleeding, ascites, anasarca, hepatic encephalopathy, and renal failure. Multiple supportive measures in place. Hypernatremia and  renal failure to be addressed by nephrology. Continue with current therapies without change. We'll follow with you  Wilhemina BonitoJohn N. Eda KeysPerry, Jr., M.D. [facility] Division of Gastroenterology

## 2014-05-29 NOTE — Progress Notes (Signed)
I have had extensive discussions with family husband. We discussed patients current circumstances and organ failures. We also discussed patient's prior wishes under circumstances such as this. Family has decided to NOT perform resuscitation if arrest but to continue current medical support for now. NO ETT, no HD, no forms life support but continued mediall care If fail - comfort care  Mcarthur Rossettianiel J. Tyson AliasFeinstein, MD, FACP Pgr: [phone] [facility]

## 2014-05-29 NOTE — Progress Notes (Signed)
PULMONARY / CRITICAL CARE MEDICINE   Name: Bing MatterMelissa B Zazueta MRN: [id number] DOB: 1966/08/26    ADMISSION DATE:  05/22/2014   REFERRING MD :  EDP PRIMARY SERVICE: PCCM  BRIEF PATIENT DESCRIPTION: [age] F with cirrhosis of unclear etiology [facility] with hematemesis and decreased LOC. [facility] hospitalization for hepatic encephalopathy. Ad  LINES / TUBES: ETT6/22  >> 6/25, 6/25 >> 6/27 L IJ CVL 6/22 >> [facility] >>   CULTURES: UC 6/22 >> negative Resp  6/22 >> mod staph >> MRSA Blood 6/22 >> canceled / not drawn Peritoneal Fluid 6/22 >> rare WBC >> negative  ANTIBIOTICS: Ceftriaxone (SBP px) 6/22 >> (plan d/c 6/28)  SIGNIFICANT EVENTS / STUDIES:  6/22 - admitted with UGIB, AMS. Intubated in ED. GI consult. Octreotide, ceftriaxone initiated. Therapeutic paracentesis recommended. Mild coagulopathy > 2 units FFP administered. Hg 8.5 > one unit PRBCs. Plts 74K 6/22 - CT head: NAD 6/22 - EGD [facility]): large mid to distal esophageal varices. Portal htn gastropathy. 7 bands placed. Will need repeat EGD 3-4 weeks 6/24 - weaning on PSV, hypokalemia / hypomagnesemia  6/25 - extubated, reintubated 6/27 - More alert, tolerating wean 6/28 - Improved mental status (able to state name / place), stronger resp status. MELD score 28 per GI  SUBJECTIVE:   No distress   VITAL SIGNS: Temp:  [98 F (36.7 C)-98.6 F (37 C)] 98.1 F (36.7 C) (06/29 0859) Pulse Rate:  [84-132] 132 (06/29 0600) Resp:  [8-21] 15 (06/29 0600) BP: (67-135)/(45-75) 114/75 mmHg (06/29 0600) SpO2:  [97 %-100 %] 100 % (06/29 0600) Weight:  [85.1 kg (187 lb 9.8 oz)] 85.1 kg (187 lb 9.8 oz) (06/29 0453)  HEMODYNAMICS: CVP:  [8 mmHg] 8 mmHg  VENTILATOR SETTINGS:    INTAKE / OUTPUT: Intake/Output     06/28 0701 - 06/29 0700 06/29 0701 - 06/30 0700   I.V. (mL/kg) 1100 (12.9)    NG/GT 620    IV Piggyback 100    Total Intake(mL/kg) 1820 (21.4)    Urine  (mL/kg/hr) 397 (0.2)    Stool 3225 (1.6)    Total Output 3622     Net -1802            PHYSICAL EXAMINATION: General: chronically ill in NAD Neuro: awake, hoarse voice, follows commands HEENT: sclericterus Cardiovascular: RRR s M Lungs: reduced Abdomen: round / distended, bsx4 active Ext: symmetric LE edema, multiple ecchymoses Skin: multiple telangiectasias on chest, arms  LABS: PULMONARY  Recent Labs Lab 05/23/14 0430 05/25/14 1533  PHART 7.403 7.305*  PCO2ART 30.2* 34.8*  PO2ART 133.0* 178.0*  HCO3 18.5* 16.8*  TCO2 19.5 17.9  O2SAT 99.2 100.0   CBC  Recent Labs Lab 05/26/14 0500 05/28/14 0359 05/29/14 0504  HGB 8.0* 8.2* 7.0*  HCT 24.4* 25.8* 21.8*  WBC 6.5 5.1 3.1*  PLT 43* 39* 28*   COAGULATION  Recent Labs Lab 05/23/14 0450 05/24/14 0500 05/25/14 1100 05/26/14 0500  INR 1.67* 2.02* 1.91* 1.92*   CARDIAC  No results found for this basename: TROPONINI,  in the last 168 hours No results found for this basename: PROBNP,  in the last 168 hours  CHEMISTRY  Recent Labs Lab 05/23/14 0450 05/24/14 0500 05/24/14 1615 05/25/14 0500 05/26/14 0500 05/28/14 0359 05/29/14 0504  NA 139 145 144 146 145 152* 159*  K 3.1* 2.9* 3.3* 3.0* 3.6* 3.8 3.9  CL 97 106 106 109 108 116* 122*  CO2 18* 17* 17*  16* 16* 17* 18*  GLUCOSE 113* 109* 111* 105* 175* 144* 132*  BUN 44* 41* 38* 38* 38* 35* 33*  CREATININE 4.04* 3.77* 3.64* 3.44* 3.43* 3.61* 3.57*  CALCIUM 6.8* 6.3* 6.4* 6.6* 7.8* 8.4 9.1  MG  --  0.9* 1.3*  --   --  1.5  --   PHOS  --  4.7*  --   --   --   --   --    Estimated Creatinine Clearance: 20.4 ml/min (by C-G formula based on Cr of 3.57).  LIVER  Recent Labs Lab 05/23/14 0450 05/24/14 0500 05/25/14 1100 05/26/14 0500 05/26/14 0930  AST  --   --   --   --  43*  ALT  --   --   --   --  17  ALKPHOS  --   --   --   --  157*  BILITOT  --   --   --   --  2.1*  PROT  --   --   --   --  6.3  ALBUMIN  --   --   --   --  2.7*  INR 1.67*  2.02* 1.91* 1.92*  --    INFECTIOUS  Recent Labs Lab 05/22/14 1155  PROCALCITON 0.30   ENDOCRINE CBG (last 3)   Recent Labs  05/28/14 2354  GLUCAP 108*     IMAGING x48h  Dg Chest Port 1 View  05/29/2014   CLINICAL DATA:  Acute respiratory failure. Status post extubation 05/27/2014.  EXAM: PORTABLE CHEST - 1 VIEW  COMPARISON:  Single view of the chest 05/28/2014 and 05/26/2014.  FINDINGS: Feeding tube and left IJ catheter remain in place, unchanged. Lung volumes are low but the lungs are clear. Heart size is normal. There is no pneumothorax or pleural effusion.  IMPRESSION: No acute finding in a low volume chest.   Electronically Signed   By: Drusilla Kannerhomas  Dalessio M.D.   On: 05/29/2014 07:53   Dg Chest Port 1 View  05/28/2014   CLINICAL DATA:  check ETT  EXAM: PORTABLE CHEST - 1 VIEW  COMPARISON:  05/26/2014  FINDINGS: Patient has been extubated  Feeding tube and left IJ central line stable position  Low volumes with some patchy subsegmental atelectasis in the lung bases right greater than left. No effusion. Visualized skeletal structures are unremarkable.  IMPRESSION: 1. Extubation.  Otherwise stable appearance since previous exam.   Electronically Signed   By: Oley Balmaniel  Hassell M.D.   On: 05/28/2014 07:15    ASSESSMENT / PLAN:  PULMONARY A: Acute resp failure - due to metabolic/ hepatic encephalopathy. 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 Family denies prior alcoholism.  Prior negative autoimmune work up.  Marland Kitchen. UGIB due to esophageal variceal bleeding  - S/P EGD, variceal banding 05/22/14. Octreotide infusion ended 6/25  Large to moderate ascites - Negative for SBP as recently as 05/23/14   - improving hepatic encephalopathy P:   SUP: PPI Lactulose and Rifaximin via panda (panda benefit outweighs risk). Needs EGD 3-4 weeks from 05/22/14 Liver transplant eval at some point I would like to feed her, d/w GI, if tolerates ice chips Will keep panda in for another day in case her MS again doesn't  Support intake of meds  HEMATOLOGIC A:   Acute blood loss anemia Coagulopathic (secondary to liver disease)  Thrombocytopenia P:  DVT px: SCDs Cbc in am  Pos balance may continued to drop plat, monnitor  INFECTIOUS A:   At risk for SBP in setting of variceal bleeding - negative fluid / cultures from para 6/23 P:   Monitor fever curve  ENDOCRINE A:  H/O DM II Risk of hypoglycemia in setting of renal and hepatic failure P:   Monitor CBG q 4 hrs To d5w   NEUROLOGIC A:   Hepatic encephalopathy - improving on oral lactulose & xifaxan History of prior seizure though due to alprazolam withdrawal Chronic alprazolam use    -persistent but improving encephalopathy P:   RASS goal: N/A Scheduled Lactulose and xifaxan  - improved PT  To sdu  Mcarthur Rossetti. Tyson Alias, MD, FACP Pgr: [phone] [facility]

## 2014-05-29 NOTE — Progress Notes (Signed)
CRITICAL VALUE ALERT  Critical value received:  Plts=28  Date of notification:  05/29/2014  Time of notification:  0630  Critical value read back:Yes.    Nurse who received alert:  Julieta Bellinihristopher Hayes, RN  MD notified (1st page):  Dr. Darrick Pennaeterding  Time of first page:  [phone]  MD notified (2nd page):  Time of second page:  Responding MD:  Dr. Darrick Pennaeterding  Time MD responded:  [phone]

## 2014-05-29 NOTE — Telephone Encounter (Signed)
noted 

## 2014-05-29 NOTE — Progress Notes (Signed)
05/29/14 1000  PT Visit Information  Last PT Received On 05/29/14  Assistance Needed +2  History of Present Illness Pt admit with hepatic encephalopathy with ESLD.  VDRF as well as anemia.    PT Time Calculation  PT Start Time [phone]  PT Stop Time 0956  PT Time Calculation (min) 10 min  Subjective Data  Subjective "I want to get back to bed."  Precautions  Precautions Fall  Restrictions  Weight Bearing Restrictions No  Cognition  Arousal/Alertness Awake/alert  Behavior During Therapy Anxious  Overall Cognitive Status Impaired/Different from baseline  Area of Impairment Orientation;Attention;Memory;Following commands;Safety/judgement;Awareness;Problem solving  Orientation Level Disoriented to;Place;Time;Situation  Current Attention Level Focused  Memory Decreased short-term memory  Following Commands Follows one step commands inconsistently;Follows one step commands with increased time  Safety/Judgement Decreased awareness of deficits;Decreased awareness of safety  Problem Solving Slow processing;Decreased initiation;Difficulty sequencing;Requires verbal cues;Requires tactile cues  General Comments Confused.  Husband states pt is talking about things from the past.    Bed Mobility  Overal bed mobility Needs Assistance;+2 for physical assistance  Bed Mobility Sit to Supine  Sit to supine Total assist;+2 for physical assistance  General bed mobility comments Needed total assist to lie down and to slide pt up in bed.    Transfers  Overall transfer level Needs assistance  Equipment used 2 person hand held assist  Transfers Sit to/from BJ'sStand;Stand Pivot Transfers  Sit to Stand Max assist;+2 physical assistance  Stand pivot transfers Mod assist;+2 physical assistance  General transfer comment Pt needed assist to stand with assist to lean forward and use of pad to obtain upright stance.  Once up, pt needed guidance at hips to take steps to chair.  PT assisted pt with weight shifting to  take pivotal steps to chair.  Pt needed cues to reach back and assist to control descent into bed.    Balance  Overall balance assessment Needs assistance;History of Falls  Sitting-balance support Single extremity supported;Feet supported  Sitting balance-Leahy Scale Poor  Sitting balance - Comments Needed mod to max assist to sit at EOB.  Leans posteriorly.    Postural control Posterior lean  Standing balance support Bilateral upper extremity supported;During functional activity  Standing balance-Leahy Scale Poor  Standing balance comment Needed assist to stand with bil HHA with posterior lean.  General Comments  General comments (skin integrity, edema, etc.) Bil LEs and belly swollen  PT - End of Session  Activity Tolerance Patient limited by fatigue;Patient limited by pain  Patient left in bed;with call bell/phone within reach;with family/visitor present;with nursing/sitter in room  Nurse Communication Mobility status;Need for lift equipment;Patient requests pain meds  PT - Assessment/Plan  PT Plan Current plan remains appropriate  PT Frequency Min 3X/week  Follow Up Recommendations SNF;Supervision/Assistance - 24 hour  PT equipment Other (comment) (possibly shower equipment)  PT Goal Progression  Progress towards PT goals Progressing toward goals  PT General Charges  $$ ACUTE PT VISIT 1 Procedure  PT Treatments  $Therapeutic Activity 8-22 mins  [facility] Center[facility]wn Ingold,PT Acute Rehabilitation [phone] (pager)

## 2014-05-29 NOTE — Evaluation (Signed)
Physical Therapy Evaluation Patient Details Name: Dana MatterMelissa B Woolston MRN: [id number] DOB: 27-Apr-1966 Today's Date: 05/29/2014   History of Present Illness  Pt admit with hepatic encephalopathy with ESLD.  VDRF as well as anemia.    Clinical Impression  Pt admitted with above. Pt currently with functional limitations due to the deficits listed below (see PT Problem List).  Pt very deconditioned.   Pt will benefit from skilled PT to increase their independence and safety with mobility to allow discharge to the venue listed below.     Follow Up Recommendations SNF;Supervision/Assistance - 24 hour    Equipment Recommendations  Other (comment) (possibly shower equipment)    Recommendations for Other Services       Precautions / Restrictions Precautions Precautions: Fall Restrictions Weight Bearing Restrictions: No      Mobility  Bed Mobility Overal bed mobility: Needs Assistance;+2 for physical assistance Bed Mobility: Supine to Sit     Supine to sit: Mod assist;+2 for physical assistance     General bed mobility comments: Needed assist to move LEs off bed and asssit for elevation of trunk.   Transfers Overall transfer level: Needs assistance Equipment used: 2 person hand held assist Transfers: Sit to/from UGI CorporationStand;Stand Pivot Transfers Sit to Stand: Mod assist;+2 physical assistance Stand pivot transfers: Mod assist;+2 physical assistance       General transfer comment: Pt needed assist to stand with assist to lean forward and use of pad to obtain upright stance.  Once up, pt needed guidance at hips to take steps to chair.  PT assisted pt with weight shifting to take pivotal steps to chair.  Pt needed cues to reach back and assist to control descent into chair.    Ambulation/Gait                Stairs            Wheelchair Mobility    Modified Rankin (Stroke Patients Only)       Balance Overall balance assessment: Needs assistance;History of  Falls Sitting-balance support: Single extremity supported;Feet supported Sitting balance-Leahy Scale: Poor Sitting balance - Comments: Needed mod to max assist to sit at EOB.  Leans posteriorly.   Postural control: Posterior lean Standing balance support: Bilateral upper extremity supported;During functional activity Standing balance-Leahy Scale: Poor Standing balance comment: Needed assist to stand with bil HHA with posterior lean.                               Pertinent Vitals/Pain VSS, pain reported in LEs per pt but not rated.     Home Living Family/patient expects to be discharged to:: Private residence Living Arrangements: Spouse/significant other Available Help at Discharge: Available PRN/intermittently Type of Home: House Home Access: Stairs to enter Entrance Stairs-Rails: None Entrance Stairs-Number of Steps: 2 Home Layout: One level Home Equipment: Environmental consultantWalker - 2 wheels;Toilet riser      Prior Function Level of Independence: Needs assistance   Gait / Transfers Assistance Needed: Walked short distances with RW  ADL's / Homemaking Assistance Needed: mod assist with bathing and dressing        Hand Dominance   Dominant Hand: Right    Extremity/Trunk Assessment   Upper Extremity Assessment: Defer to OT evaluation           Lower Extremity Assessment: RLE deficits/detail;LLE deficits/detail RLE Deficits / Details: grossly 3-/5; ROM limited by stiffness LLE Deficits / Details: grossly 3-/5; ROM limited by stiffness  Cervical / Trunk Assessment: Other exceptions (stiff in trunk with difficulty sitting EOB)  Communication   Communication: No difficulties  Cognition Arousal/Alertness: Awake/alert Behavior During Therapy: Anxious Overall Cognitive Status: Impaired/Different from baseline Area of Impairment: Orientation;Attention;Memory;Following commands;Safety/judgement;Awareness;Problem solving Orientation Level: Disoriented  to;Place;Time;Situation Current Attention Level: Focused Memory: Decreased short-term memory Following Commands: Follows one step commands inconsistently;Follows one step commands with increased time Safety/Judgement: Decreased awareness of deficits;Decreased awareness of safety   Problem Solving: Slow processing;Decreased initiation;Difficulty sequencing;Requires verbal cues;Requires tactile cues General Comments: Confused.  Husband states pt is talking about things from the past.      General Comments General comments (skin integrity, edema, etc.): Bil LEs and belly swollen    Exercises        Assessment/Plan    PT Assessment Patient needs continued PT services  PT Diagnosis Generalized weakness;Acute pain   PT Problem List Decreased activity tolerance;Decreased balance;Decreased mobility;Decreased strength;Decreased knowledge of use of DME;Decreased coordination;Decreased cognition;Decreased safety awareness;Decreased range of motion;Pain;Decreased skin integrity  PT Treatment Interventions DME instruction;Gait training;Functional mobility training;Therapeutic activities;Therapeutic exercise;Balance training;Patient/family education;Cognitive remediation;Neuromuscular re-education   PT Goals (Current goals can be found in the Care Plan section) Acute Rehab PT Goals Patient Stated Goal: to go home PT Goal Formulation: With patient/family Time For Goal Achievement: 06/12/14 Potential to Achieve Goals: Fair    Frequency Min 3X/week   Barriers to discharge Decreased caregiver support husband states she will not have 24 hour care    Co-evaluation               End of Session Equipment Utilized During Treatment: Gait belt Activity Tolerance: Patient limited by fatigue;Patient limited by pain Patient left: in chair;with call bell/phone within reach;with family/visitor present Nurse Communication: Mobility status;Need for lift equipment         Time: [phone] PT  Time Calculation (min): 28 min   Charges:   PT Evaluation $Initial PT Evaluation Tier I: 1 Procedure PT Treatments $Therapeutic Activity: 8-22 mins   PT G Codes:          INGOLD,DAWN 05/29/2014, 10:02 AM Audree Camelawn Ingold,PT Acute Rehabilitation [phone] (pager)

## 2014-05-29 NOTE — Progress Notes (Signed)
Report called to Denny PeonErin, RN on 2C.  Asher Muir- Jamie Covington,RN

## 2014-05-29 NOTE — Progress Notes (Addendum)
NUTRITION FOLLOW UP  Intervention:   Glucerna Shake PO BID, each supplement provides 220 kcal and 10 grams of protein  Nutrition Dx:   Inadequate oral intake now related to fluctuating mental status as evidenced by diet just advanced to full liquids. Ongoing.  Goal:   Intake to meet >90% of estimated nutrition needs. Unmet.  Monitor:   Diet tolerance, PO intake, labs, weight trend.  Assessment:   48 year old female with a PMH of cirrhosis, LA Grade A esophagitis, GERD, and DM who presents to the hospital with hematemesis and decreased LOC.  [facility] for [facility].  05/22/14 EGD with seven bands placed to large mid to distal esoph varieces (Dr Elnoria HowardHung), portal gastropathy.  Discussed patient in ICU rounds today. Patient was extubated on 6/27. Patient with ongoing LE edema, ascites. Hepatic encephalopathy is improving. Diet advanced to full liquids. Expect intake will be poor with ongoing fluctuating mental status.  Height: Ht Readings from Last 1 Encounters:  05/22/14 5\' 4"  (1.626 m)    Weight Status:  Trending down with negative fluid status Wt Readings from Last 1 Encounters:  05/29/14 187 lb 9.8 oz (85.1 kg)  05/22/14 204 lb 5.9 oz (92.7 kg)   Re-estimated needs:  Kcal: 1600-1800 Protein: 65-80 gm Fluid: 1.6-1.8 L  Skin: no wounds  Diet Order: Full Liquid   Intake/Output Summary (Last 24 hours) at 05/29/14 1231 Last data filed at 05/29/14 1000  Gross per 24 hour  Intake   1655 ml  Output   2806 ml  Net  -1151 ml    Last BM: 6/29   Labs:   Recent Labs Lab 05/23/14 0450 05/24/14 0500 05/24/14 1615  05/26/14 0500 05/28/14 0359 05/29/14 0504  NA 139 145 144  < > 145 152* 159*  K 3.1* 2.9* 3.3*  < > 3.6* 3.8 3.9  CL 97 106 106  < > 108 116* 122*  CO2 18* 17* 17*  < > 16* 17* 18*  BUN 44* 41* 38*  < > 38* 35* 33*  CREATININE 4.04* 3.77* 3.64*  < > 3.43* 3.61* 3.57*  CALCIUM 6.8* 6.3* 6.4*  < > 7.8*  8.4 9.1  MG  --  0.9* 1.3*  --   --  1.5  --   PHOS  --  4.7*  --   --   --   --   --   GLUCOSE 113* 109* 111*  < > 175* 144* 132*  < > = values in this interval not displayed.  CBG (last 3)   Recent Labs  05/28/14 2354  GLUCAP 108*    Scheduled Meds: . antiseptic oral rinse  15 mL Mouth Rinse QID  . calamine   Topical TID  . chlorhexidine  15 mL Mouth Rinse BID  . lactulose  45 g Oral Q6H  . pantoprazole sodium  40 mg Per Tube Q12H  . rifaximin  550 mg Per Tube BID    Continuous Infusions: . dextrose       Joaquin CourtsKimberly Harris, RD, LDN, CNSC Pager [phone] After Hours Pager [phone]

## 2014-05-30 LAB — BASIC METABOLIC PANEL
BUN: 32 mg/dL — ABNORMAL HIGH (ref 6–23)
BUN: 31 mg/dL — ABNORMAL HIGH (ref 6–23)
CALCIUM: 8.8 mg/dL (ref 8.4–10.5)
CALCIUM: 8.9 mg/dL (ref 8.4–10.5)
CO2: 19 mEq/L (ref 19–32)
CO2: 18 meq/L — AB (ref 19–32)
CREATININE: 3.61 mg/dL — AB (ref 0.50–1.10)
CREATININE: 3.43 mg/dL — AB (ref 0.50–1.10)
Chloride: 126 mEq/L — ABNORMAL HIGH (ref 96–112)
Chloride: 123 mEq/L — ABNORMAL HIGH (ref 96–112)
GFR calc Af Amer: 16 mL/min — ABNORMAL LOW (ref >90)
GFR calc Af Amer: 17 mL/min — ABNORMAL LOW (ref >90)
GFR calc non Af Amer: 14 mL/min — ABNORMAL LOW (ref >90)
GFR calc non Af Amer: 15 mL/min — ABNORMAL LOW (ref >90)
GLUCOSE: 132 mg/dL — AB (ref 70–99)
GLUCOSE: 149 mg/dL — AB (ref 70–99)
Potassium: 3.3 mEq/L — ABNORMAL LOW (ref 3.7–5.3)
Potassium: 3.1 mEq/L — ABNORMAL LOW (ref 3.7–5.3)
Sodium: 161 mEq/L — ABNORMAL HIGH (ref 137–147)
Sodium: 158 mEq/L — ABNORMAL HIGH (ref 137–147)

## 2014-05-30 LAB — CBC WITH DIFFERENTIAL/PLATELET
BASOS PCT: 1 % (ref 0–1)
Basophils Absolute: 0.1 10*3/uL (ref 0.0–0.1)
EOS ABS: 0.3 10*3/uL (ref 0.0–0.7)
Eosinophils Relative: 5 % (ref 0–5)
HCT: 24.8 % — ABNORMAL LOW (ref 36.0–46.0)
Hemoglobin: 7.7 g/dL — ABNORMAL LOW (ref 12.0–15.0)
Lymphocytes Relative: 22 % (ref 12–46)
Lymphs Abs: 1.2 10*3/uL (ref 0.7–4.0)
MCH: 31 pg (ref 26.0–34.0)
MCHC: 31 g/dL (ref 30.0–36.0)
MCV: 100 fL (ref 78.0–100.0)
MONO ABS: 0.4 10*3/uL (ref 0.1–1.0)
Monocytes Relative: 7 % (ref 3–12)
Neutro Abs: 3.4 10*3/uL (ref 1.7–7.7)
Neutrophils Relative %: 65 % (ref 43–77)
PLATELETS: 42 10*3/uL — AB (ref 150–400)
RBC: 2.48 MIL/uL — ABNORMAL LOW (ref 3.87–5.11)
RDW: 21.9 % — ABNORMAL HIGH (ref 11.5–15.5)
WBC: 5.4 10*3/uL (ref 4.0–10.5)

## 2014-05-30 LAB — GLUCOSE, CAPILLARY: Glucose-Capillary: 122 mg/dL — ABNORMAL HIGH (ref 70–99)

## 2014-05-30 MED ORDER — DEXTROSE-NACL 5-0.45 % IV SOLN
INTRAVENOUS | Status: DC
  Administered 2014-05-30 – 2014-06-01 (×3): via INTRAVENOUS

## 2014-05-30 NOTE — Evaluation (Signed)
Clinical/Bedside Swallow Evaluation Patient Details  Name: Dana Petersen MRN: [id number] Date of Birth: Jun 19, 1966  Today's Date: 05/30/2014 Time: 1400-1435 SLP Time Calculation (min): 35 min  Past Medical History:  Past Medical History  Diagnosis Date  . ABNORMAL FINDINGS GI TRACT 09/12/2008  . ABNORMAL TRANSAMINASE-LFT'S 09/12/2008  . Acute bronchitis 07/21/2008  . Cirrhosis of liver without mention of alcohol 08/11/2008    sees Dr. Yancey FlemingsJohn Petersen  . DEPRESSIVE DISORDER, RCR, MODERATE 07/09/2007  . DIABETES MELLITUS, TYPE II 07/09/2007  . GERD 09/07/2008  . Headache(784.0) 09/24/2010  . HIATAL HERNIA 09/07/2008  . HYPERTENSION 07/09/2007  . LOW BACK PAIN 07/02/2010  . Overweight(278.02) 10/01/2007  . 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 Seizures   . Anemia   . Pancytopenia     sees Dr. Eli HoseFiras Petersen   . Thrombocytopenia, unspecified 04/19/2013  . Anemia, unspecified 04/19/2013   Past Surgical History:  Past Surgical History  Procedure Laterality Date  . Tubal ligation    . Carpal tunnel release    . Lumbar laminectomy      L5-S1  dr. Marianne Petersen  . Esophagogastroduodenoscopy N/A 05/22/2014    Procedure: ESOPHAGOGASTRODUODENOSCOPY (EGD);  Surgeon: Dana BelfastPatrick D Hung, MD;  [facility] ENDOSCOPY;  Service: Endoscopy;  Laterality: N/A;  bedside    HPI:  48 year old female admitted 05/22/14 due to hematemasis and decreased LOC. PMH significant for GERD, hiatal hernia, non-alcoholic cirrhosis, hepatic encephalopathy ([facility]   Assessment / Plan / Recommendation Clinical Impression  Oral care completed with suction. Pt oral cavity noted to be significantly dry, lips dry and cracked. Pt vocal quality noted to be hoarse, speech difficult to understand due to profound weakness, low volume. Pt unable to follow directions to cough, clear throat.  Ice chips and water were provided following oral care. Significantly reduced laryngeal elevation was noted on palpation. Swallow reflex  delayed, as indicated by oral cavity clear of water, but without apparent swallow reflex elicited. No overt s/s aspiration were observed, however, silent aspiration is suspected (prolonged intubation, twice) but cannot be determined at bedside.  At this time, it is recommended that pt be allowed ice chips by SLP or RN ONLY immediately following thorough oral care with biotene products.  Primary nutrition and hydration recommended to be provided via Panda. Objective study is anticipated prior to initiation of po intake. Both FEES and MBS were discussed with family as options. ST to continue to follow pt for dx tx, and to determine readiness for objective study and/or po intake.    Aspiration Risk  Severe    Diet Recommendation NPO;Ice chips PRN after oral care   Medication Administration: Via alternative means    Other  Recommendations Recommended Consults: MBS;FEES Other Recommendations: Have oral suction available   Follow Up Recommendations  Skilled Nursing facility    Frequency and Duration min 2x/week  2 weeks   Pertinent Vitals/Pain VSS, no pain indicated    SLP Swallow Goals  tolerance of po intake   Swallow Study Prior Functional Status   no history of swallowing difficulty. Dentures at home per husband.    General Date of Onset: 05/22/14 HPI: 48 year old female [facility]22/[facility]. PMH significant for GERD, hiatal hernia, non-alcoholic cirrhosis, hepatic encephalopathy ([facility] Type of Study: Bedside swallow evaluation Previous Swallow Assessment: none found Diet Prior to this Study: NPO;Panda Temperature Spikes Noted: No Respiratory Status: Room air History of Recent Intubation: Yes Length of Intubations (  days): 6 days Date extubated: 05/27/14 Behavior/Cognition: Alert;Cooperative;Confused;Doesn't follow directions Oral Cavity - Dentition: Edentulous;Dentures, not available Self-Feeding Abilities: Total  assist Patient Positioning: Upright in bed Baseline Vocal Quality: Hoarse Volitional Cough: Cognitively unable to elicit Volitional Swallow: Unable to elicit    Oral/Motor/Sensory Function Overall Oral Motor/Sensory Function: Impaired (generalized weakness) Labial ROM: Reduced right;Reduced left Labial Strength: Reduced Lingual ROM: Reduced right;Reduced left Lingual Strength: Reduced Facial ROM: Reduced right;Reduced left Mandible: Within Functional Limits   Ice Chips Ice chips: Impaired Presentation: Spoon Pharyngeal Phase Impairments: Suspected delayed Swallow;Decreased hyoid-laryngeal movement   Thin Liquid Thin Liquid: Impaired Presentation: Straw Pharyngeal  Phase Impairments: Suspected delayed Swallow;Decreased hyoid-laryngeal movement    Nectar Thick Nectar Thick Liquid: Not tested   Honey Thick Honey Thick Liquid: Not tested   Puree Puree: Not tested   Solid   GO   Dana Petersen, Northeast Methodist HospitalMSP, CCC-SLP [phone]  Solid: Not tested       Dana Petersen, Dana Petersen 05/30/2014,2:56 PM

## 2014-05-30 NOTE — Progress Notes (Signed)
Daily Rounding Note  05/30/2014, 9:13 AM  LOS: 8 days   SUBJECTIVE:       Taking ice chips, has not had the ordered full liquids yet.  Panda tube in place.  Rectal tube in place.  Recorded urine output to foley 300 cc yeasterday, 50 cc so far today.  Stool output 2100 cc yesterday.   OBJECTIVE:         Vital signs in last 24 hours:    Temp:  [98.1 F (36.7 C)-98.2 F (36.8 C)] 98.1 F (36.7 C) (06/30 0730) Pulse Rate:  [81-91] 81 (06/30 0730) Resp:  [8-12] 11 (06/30 0730) BP: (109-130)/(57-94) 125/57 mmHg (06/30 0730) SpO2:  [99 %-100 %] 100 % (06/30 0730) Weight:  [86.4 kg (190 lb 7.6 oz)] 86.4 kg (190 lb 7.6 oz) (06/30 0500) Last BM Date: 05/29/14 General: ill looking, alert and mumbling and unable to understand her   Heart: RRR Chest: clear, quiet breathing Abdomen: soft but distended, especially in upper abdomen.  flexiseal with dark, bilious watery effluent.  Not tender. BS active.  Extremities: LE edema and anasarca improved Neuro/Psych:  Less obtunded than last week. Follows simple commands, attempts to speak are incomprehensible. Some asterixis vs tremor in upper extremities.  Heme: multiple petechiae and purpura on arms.   Intake/Output from previous day: 06/29 0701 - 06/30 0700 In: 1003.3 [I.V.:883.3; NG/GT:120] Out: 2400 [Urine:300; Stool:2100]  Intake/Output this shift: Total I/O In: -  Out: 50 [Urine:50]  Lab Results:  Recent Labs  05/28/14 0359 05/29/14 0504 05/30/14 0500  WBC 5.1 3.1* 5.4  HGB 8.2* 7.0* 7.7*  HCT 25.8* 21.8* 24.8*  PLT 39* 28* 42*  MCV                     98            100   BMET  Recent Labs  05/29/14 0504 05/29/14 1507 05/29/14 2210  NA 159* 157* 159*  K 3.9 3.7 3.5*  CL 122* 123* 125*  CO2 18* 16* 18*  GLUCOSE 132* 125* 127*  BUN 33* 33* 33*  CREATININE 3.57* 3.63* 3.60*  CALCIUM 9.1 9.0 8.9    PT/INR No results found for this basename: LABPROT, INR,   in the last 72 hours  Studies/Results: Dg Chest Port 1 View  05/29/2014   CLINICAL DATA:  Acute respiratory failure. Status post extubation 05/27/2014.  EXAM: PORTABLE CHEST - 1 VIEW  COMPARISON:  Single view of the chest 05/28/2014 and 05/26/2014.  FINDINGS: Feeding tube and left IJ catheter remain in place, unchanged. Lung volumes are low but the lungs are clear. Heart size is normal. There is no pneumothorax or pleural effusion.  IMPRESSION: No acute finding in a low volume chest.   Electronically Signed   By: Drusilla Kannerhomas  Dalessio M.D.   On: 05/29/2014 07:53   Scheduled Meds: . antiseptic oral rinse  15 mL Mouth Rinse QID  . calamine   Topical TID  . chlorhexidine  15 mL Mouth Rinse BID  . feeding supplement (GLUCERNA SHAKE)  237 mL Oral BID BM  . lactulose  45 g Oral Q6H  . pantoprazole sodium  40 mg Per Tube Q12H  . rifaximin  550 mg Per Tube BID   Continuous Infusions: . dextrose 50 mL/hr at 05/29/14 1420   PRN Meds:.ondansetron (ZOFRAN) IV   ASSESMENT:   *  Advanced, decompensated cirrhosis.  *  Variceal bleed.  EGD with  banding x 7 6/22 (Dr Elnoria HowardHung).  On BID IV Protonix. No BB on board   *  Ascites, anasarca. Diuretics on hold.  Diagnostic paracentesis 6/23: no SBP.   *  ARF. IV albumin given 6/28. Oliguric. Fluids of D5 at 50 per hour. ? ATN vs HRS?  *  Thrombocytopenia.   *  Encephalopathy.  On Rifaximin, Chronulac. Max ammonia 177 on 6/22. Renal not yet involved.   *  Coagulopathy secondary to liver dysfunction.   *  Anemia.   *  Hypernatremia.   *  Hypokalemia.    PLAN   *  Add Nadolol? NOT UNTIL OUTPATIENT *  PT/INR in AM.  *  Full liquids and ensure as tolerated.  ? If poor po intake or clinical dysphagia, should we start NGT feeding.  At present seems like she needs more fluid. D/w Dr Marina GoodellPerry and will change to D51/2 NS at 125ml per hour. *  Was renal consulted?  Think there input would be valuable. I paged Dr Abel Prestoolodonato.     Jennye MoccasinSarah Gribbin  05/30/2014, 9:13  AM Pager: [phone]  GI ATTENDING  Interval history data reviewed. Patient personally seen and examined. Husband and son in room. Multiple questions asked and answered. Agree with H&P as above. Patient still remains critically ill with ongoing encephalopathy, anasarca, and renal failure. Hyponatremia not addressed. We started half normal saline today. Renal consultation requested. Prognosis poor. Will follow  Wilhemina BonitoJohn N. Eda KeysPerry, Jr., M.D. [facility] Division of Gastroenterology

## 2014-05-30 NOTE — Consult Note (Signed)
Reason for Consult:AKI Referring Physician: Marina GoodellPerry, MD  Bing MatterMelissa B Petersen is an 48 y.o. female.  HPI: Pt is a 48yo WF with PMH sig for cryptogenic cirrhosis who was admitted on 05/22/14 with AMS, hematemasis from variceal bleeding and underwent EGD and banding.  She was also noted to have AKI with Scr of 4.08, although we do not have any recent Scr over the last year.  [facility] with hepatic encephalopathy but her husband denies them mentioning anything about her kidney function.  We were asked to see the patient due to a persistently elevated Scr following her ABLA/hypotension and now with ongoing diuresis and ascites/anasarca.  Of note, she had been on ibuproten 800mg  q6hrs and metformin PTA.  Trend in Creatinine:  Creatinine, Ser  Date/Time Value Ref Range Status  05/30/2014 10:10 AM 3.61* 0.50 - 1.10 mg/dL Final  7/82/95626/29/2015 13:0810:10 PM 3.60* 0.50 - 1.10 mg/dL Final  6/57/84696/29/2015  6:293:07 PM 3.63* 0.50 - 1.10 mg/dL Final  5/28/41326/29/2015  4:405:04 AM 3.57* 0.50 - 1.10 mg/dL Final  1/02/72536/28/2015  6:643:59 AM 3.61* 0.50 - 1.10 mg/dL Final  4/03/47426/26/2015  5:955:00 AM 3.43* 0.50 - 1.10 mg/dL Final  6/38/75646/25/2015  3:325:00 AM 3.44* 0.50 - 1.10 mg/dL Final  9/51/88416/24/2015  6:604:15 PM 3.64* 0.50 - 1.10 mg/dL Final  6/30/16016/24/2015  0:935:00 AM 3.77* 0.50 - 1.10 mg/dL Final  2/35/57326/23/2015  2:024:50 AM 4.04* 0.50 - 1.10 mg/dL Final  5/42/70626/22/2015  3:765:22 AM 4.40* 0.50 - 1.10 mg/dL Final  2/83/15176/22/2015  6:164:42 AM 4.08* 0.50 - 1.10 mg/dL Final  0/7/37105/05/2013 62:6912:51 PM 0.8  0.4 - 1.2 mg/dL Final  48/54/627010/29/2013  3:506:40 AM 0.72  0.50 - 1.10 mg/dL Final  09/38/182910/28/2013  9:374:55 AM 0.71  0.50 - 1.10 mg/dL Final  16/96/789310/27/2013  8:109:56 AM 0.70  0.50 - 1.10 mg/dL Final  17/51/025810/27/2013  5:274:09 AM 0.80  0.50 - 1.10 mg/dL Final  7/82/42354/24/2013  3:615:30 PM 0.70  0.50 - 1.10 mg/dL Final  44/31/540011/20/2012 86:7612:50 PM 0.62  0.50 - 1.10 mg/dL Final  19/50/932610/24/2012 71:2412:36 PM 0.9  0.4 - 1.2 mg/dL Final  58/09/983311/14/2011  8:255:05 PM 0.7  0.4-1.2 mg/dL Final  05/39/767311/08/2010 41:9311:50 AM 0.8  0.4-1.2 mg/dL Final  7/9/02406/12/2009  9:731:00 PM 0.67  0.4 -  1.2 mg/dL Final  5/32/99247/16/2010  2:681:53 PM 0.7  0.4-1.2 mg/dL Final  34/19/622212/22/2009  9:793:52 PM 0.7  0.4 - 1.2 mg/dL Final  89/21/194110/13/2009 74:0811:04 AM 0.7  0.4-1.2 mg/dL Final  1/44/81859/08/2008 63:1411:06 AM 0.67  0.40 - 1.20 mg/dL Final  9/70/26378/21/2009 85:8812:46 PM 0.6  0.4-1.2 mg/dL Final  5/0/27748/07/2007  1:283:41 PM 0.5  0.4-1.2 mg/dL Final    PMH:   Past Medical History  Diagnosis Date  . ABNORMAL FINDINGS GI TRACT 09/12/2008  . ABNORMAL TRANSAMINASE-LFT'S 09/12/2008  . Acute bronchitis 07/21/2008  . Cirrhosis of liver without mention of alcohol 08/11/2008    sees Dr. Yancey FlemingsJohn Perry  . DEPRESSIVE DISORDER, RCR, MODERATE 07/09/2007  . DIABETES MELLITUS, TYPE II 07/09/2007  . GERD 09/07/2008  . Headache(784.0) 09/24/2010  . HIATAL HERNIA 09/07/2008  . HYPERTENSION 07/09/2007  . LOW BACK PAIN 07/02/2010  . Overweight(278.02) 10/01/2007  . 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 Seizures   . Anemia   . Pancytopenia     sees Dr. Eli HoseFiras Shadad   . Thrombocytopenia, unspecified 04/19/2013  . Anemia, unspecified 04/19/2013    PSH:   Past Surgical History  Procedure Laterality Date  . Tubal ligation    .  Carpal tunnel release    . Lumbar laminectomy      L5-S1  dr. appling  . Esophagogastroduodenoscopy N/A 05/22/2014    Procedure: ESOPHAGOGASTRODUODENOSCOPY (EGD);  Surgeon: Theda BelfastPatrick D Hung, MD;  [facility];  Service: Endoscopy;  Laterality: N/A;  bedside     Allergies:  Allergies  Allergen Reactions  . Aspirin Other (See Comments)    Causes elevated liver enzymes  . Phenergan [Promethazine Hcl] Other (See Comments)    Muscle spasms  . Tylenol [Acetaminophen]     Caused elevated liver enzymes    Medications:   Prior to Admission medications   Medication Sig Start Date End Date Taking? Authorizing Provider  alprazolam Prudy Feeler(XANAX) 2 MG tablet Take 2 mg by mouth every other day.   Yes Historical Provider, MD  furosemide (LASIX) 40 MG tablet Take 1 tablet (40 mg total) by mouth 2 (two) times daily. 05/08/14  Yes Nelwyn SalisburyStephen A Fry, MD   ibuprofen (ADVIL,MOTRIN) 200 MG tablet Take 800 mg by mouth every 6 (six) hours as needed for moderate pain. for body aches.   Yes Historical Provider, MD  Lactulose 20 GM/30ML SOLN Take 30 mLs (20 g total) by mouth 2 (two) times daily. 05/08/14  Yes Nelwyn SalisburyStephen A Fry, MD  metFORMIN (GLUCOPHAGE) 1000 MG tablet Take 1 tablet (1,000 mg total) by mouth 2 (two) times daily. 05/08/14  Yes Nelwyn SalisburyStephen A Fry, MD  mometasone-formoterol [facility](DULERA) 200-5 MCG/ACT AERO Inhale 2 puffs into the lungs 2 (two) times daily. 03/27/14  Yes Nelwyn SalisburyStephen A Fry, MD  Oxycodone HCl 20 MG TABS Take 1 tablet (20 mg total) by mouth every 6 (six) hours as needed (pain). For pain 05/08/14  Yes Nelwyn SalisburyStephen A Fry, MD  potassium chloride SA (K-DUR,KLOR-CON) 20 MEQ tablet Take 20 mEq by mouth 2 (two) times daily.   Yes Historical Provider, MD  promethazine (PHENERGAN) 25 MG tablet Take 1 tablet (25 mg total) by mouth every 4 (four) hours as needed for nausea or vomiting. 05/05/14  Yes Nelwyn SalisburyStephen A Fry, MD  albuterol (PROVENTIL HFA;VENTOLIN HFA) 108 (90 BASE) MCG/ACT inhaler Inhale 2 puffs into the lungs every 4 (four) hours as needed for wheezing or shortness of breath. 05/02/13   Nelwyn SalisburyStephen A Fry, MD    Inpatient medications: . antiseptic oral rinse  15 mL Mouth Rinse QID  . calamine   Topical TID  . chlorhexidine  15 mL Mouth Rinse BID  . feeding supplement (GLUCERNA SHAKE)  237 mL Oral BID BM  . lactulose  45 g Oral Q6H  . pantoprazole sodium  40 mg Per Tube Q12H  . rifaximin  550 mg Per Tube BID    Discontinued Meds:   Medications Discontinued During This Encounter  Medication Reason  . sodium chloride 0.9 % bolus 500 mL   . pantoprazole (PROTONIX) 80 mg in sodium chloride 0.9 % 100 mL IVPB Duplicate  . cloNIDine (CATAPRES) 0.1 MG tablet Discontinued by provider  . potassium chloride SA (K-DUR,KLOR-CON) 20 MEQ tablet One time medication  . alprazolam (XANAX) 2 MG tablet Dose change  . midazolam (VERSED) 1 mg/mL in sodium chloride 0.9 % 50 mL infusion    . midazolam (VERSED) injection Patient Discharge  . fentaNYL (SUBLIMAZE) injection Patient Discharge  . 0.9 %  sodium chloride infusion   . midazolam (VERSED) 1 mg/mL in sodium chloride 0.9 % 50 mL infusion   . pantoprazole (PROTONIX) 80 mg in sodium chloride 0.9 % 250 mL infusion   . fentaNYL (SUBLIMAZE) injection 25-100 mcg   .  norepinephrine (LEVOPHED) 8 mg in dextrose 5 % 250 mL infusion   . norepinephrine (LEVOPHED) 16 mg in dextrose 5 % 250 mL infusion   . phytonadione (VITAMIN K) 10 mg in dextrose 5 % 50 mL IVPB Duplicate  . lactulose (CHRONULAC) 10 GM/15ML solution 20 g   . fentaNYL (SUBLIMAZE) injection 25-50 mcg Duplicate  . potassium chloride 10 mEq in 50 mL *CENTRAL LINE* IVPB   . 0.9 %  sodium chloride infusion   . magnesium sulfate IVPB 2 g 50 mL   . octreotide (SANDOSTATIN) 2 mcg/mL in sodium chloride 0.9 % 250 mL infusion   . norepinephrine (LEVOPHED) 16 mg in dextrose 5 % 250 mL infusion   . fentaNYL (SUBLIMAZE) injection 50 mcg   . fentaNYL (SUBLIMAZE) 10 mcg/mL in sodium chloride 0.9 % 250 mL infusion   . fentaNYL (SUBLIMAZE) bolus via infusion 50-100 mcg   . lactulose (CHRONULAC) enema 300 mL   . lactulose (CHRONULAC) 10 GM/15ML solution 30 g   . fentaNYL (SUBLIMAZE) injection 25-100 mcg   . albumin human 25 % solution 12.5 g   . pantoprazole (PROTONIX) injection 40 mg Change in therapy  . cefTRIAXone (ROCEPHIN) 2 g in dextrose 5 % 50 mL IVPB Completed Course  . dextrose 5 % and 0.45 % NaCl with KCl 40 mEq/L infusion   . feeding supplement (ENSURE COMPLETE) (ENSURE COMPLETE) liquid 237 mL   . dextrose 5 % solution     Social History:  reports that she has been smoking Cigarettes.  She has been smoking about 0.00 packs per day. She has never used smokeless tobacco. She reports that she does not drink alcohol or use illicit drugs.  Family History:   Family History  Problem Relation Age of Onset  . Diabetes Neg Hx     family hx , 1st degree relative  .  Hypertension Neg Hx     famliy  . Cancer Neg Hx     family hx - lung , breat, pancreatic ,ovarian    Review of systems not obtained due to patient factors. Weight change: 1.3 kg (2 lb 13.9 oz)  Intake/Output Summary (Last 24 hours) at 05/30/14 1658 Last data filed at 05/30/14 1537  Gross per 24 hour  Intake 733.33 ml  Output   2850 ml  Net -2116.67 ml   BP 134/65  Pulse 86  Temp(Src) 99 F (37.2 C) (Oral)  Resp 10  Ht 5\' 4"  (1.626 m)  Wt 86.4 kg (190 lb 7.6 oz)  BMI 32.68 kg/m2  SpO2 99%  LMP 05/25/2014 Filed Vitals:   05/30/14 0500 05/30/14 0730 05/30/14 1156 05/30/14 1608  BP:  125/57 134/64 134/65  Pulse:  81 88 86  Temp:  98.1 F (36.7 C) 97.4 F (36.3 C) 99 F (37.2 C)  TempSrc:  Axillary Oral Oral  Resp:  11 12 10   Height:      Weight: 86.4 kg (190 lb 7.6 oz)     SpO2:  100% 99% 99%     General appearance: awake but nonverbal and does not follow commands Head: Normocephalic, without obvious abnormality, atraumatic Eyes: positive findings: sclera +icterus Neck: no adenopathy, no carotid bruit, supple, symmetrical, trachea midline and thyroid not enlarged, symmetric, no tenderness/mass/nodules Resp: diminished breath sounds bibasilar Cardio: regular rate and rhythm, S1, S2 normal, no murmur, click, rub or gallop GI: distended, +fluid wave, +BS, no guarding/rebound Extremities: edema 2+edema bilateral lower ext  Labs: Basic Metabolic Panel:  Recent Labs Lab 05/24/14 0500  05/25/14 0500 05/26/14 0500 05/26/14 0930 05/28/14 0359 05/29/14 0504 05/29/14 1507 05/29/14 2210 05/30/14 1010  NA 145  < > 146 145  --  152* 159* 157* 159* 161*  K 2.9*  < > 3.0* 3.6*  --  3.8 3.9 3.7 3.5* 3.3*  CL 106  < > 109 108  --  116* 122* 123* 125* 126*  CO2 17*  < > 16* 16*  --  17* 18* 16* 18* 19  GLUCOSE 109*  < > 105* 175*  --  144* 132* 125* 127* 132*  BUN 41*  < > 38* 38*  --  35* 33* 33* 33* 32*  CREATININE 3.77*  < > 3.44* 3.43*  --  3.61* 3.57* 3.63* 3.60*  3.61*  ALBUMIN  --   --   --   --  2.7*  --   --   --   --   --   CALCIUM 6.3*  < > 6.6* 7.8*  --  8.4 9.1 9.0 8.9 8.8  PHOS 4.7*  --   --   --   --   --   --   --   --   --   < > = values in this interval not displayed. Liver Function Tests:  Recent Labs Lab 05/26/14 0930  AST 43*  ALT 17  ALKPHOS 157*  BILITOT 2.1*  PROT 6.3  ALBUMIN 2.7*   No results found for this basename: LIPASE, AMYLASE,  in the last 168 hours  Recent Labs Lab 05/24/14 0500 05/27/14 1800  AMMONIA 116* 26   CBC:  Recent Labs Lab 05/26/14 0500 05/28/14 0359 05/29/14 0504 05/30/14 0500  WBC 6.5 5.1 3.1* 5.4  NEUTROABS  --   --   --  3.4  HGB 8.0* 8.2* 7.0* 7.7*  HCT 24.4* 25.8* 21.8* 24.8*  MCV 93.1 97.0 98.2 100.0  PLT 43* 39* 28* 42*   PT/INR: @LABRCNTIP (inr:5) Cardiac Enzymes: )No results found for this basename: CKTOTAL, CKMB, CKMBINDEX, TROPONINI,  in the last 168 hours CBG:  Recent Labs Lab 05/23/14 1947 05/28/14 2354 05/29/14 1647 05/29/14 2238 05/30/14 1151  GLUCAP 121* 108* 117* 114* 122*    Iron Studies: No results found for this basename: IRON, TIBC, TRANSFERRIN, FERRITIN,  in the last 168 hours  Xrays/Other Studies: Dg Chest Port 1 View  05/29/2014   CLINICAL DATA:  Acute respiratory failure. Status post extubation 05/27/2014.  EXAM: PORTABLE CHEST - 1 VIEW  COMPARISON:  Single view of the chest 05/28/2014 and 05/26/2014.  FINDINGS: Feeding tube and left IJ catheter remain in place, unchanged. Lung volumes are low but the lungs are clear. Heart size is normal. There is no pneumothorax or pleural effusion.  IMPRESSION: No acute finding in a low volume chest.   Electronically Signed   By: Drusilla Kanner M.D.   On: 05/29/2014 07:53     Assessment/Plan: 1.  AKI- likely hemodynamically mediated (had a low FeNa c/w pre-renal or hepatorenal syndrome and since her renal function has remained stable, it is more c/w pre-renal).  Agree with IVF's and follow. 2. Hypernatremia-  her free water deficit is 6.5Liters.  Currently on 1/2 NS, would recommend free water boluses of 250cc q4 via NGT to help lower Na and not add as much ascites/anasarca. 3. 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   4. Hepatic encephalopathy- cont with lactulose 5. Cirrhosis- ?NASH.  Has had an overall decline over the last year per her husband.  Will need transplant referral 6. 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

## 2014-05-30 NOTE — Progress Notes (Signed)
PULMONARY / CRITICAL CARE MEDICINE   Name: Dana MatterMelissa B Prohaska MRN: [id number] DOB: 09-13-1966    ADMISSION DATE:  05/22/2014   REFERRING MD :  EDP PRIMARY SERVICE: PCCM  BRIEF PATIENT DESCRIPTION: [age] F with cirrhosis of unclear etiology [facility] with hematemesis and decreased LOC. [facility] hospitalization for hepatic encephalopathy. Ad  LINES / TUBES: ETT6/22  >> 6/25, 6/25 >> 6/27 L IJ CVL 6/22 >> [facility] >>   CULTURES: UC 6/22 >> negative Resp  6/22 >> mod staph >> MRSA Blood 6/22 >> canceled / not drawn Peritoneal Fluid 6/22 >> rare WBC >> negative  ANTIBIOTICS: Ceftriaxone (SBP px) 6/22 >> (plan d/c 6/28)  SIGNIFICANT EVENTS / STUDIES:  6/22 - admitted with UGIB, AMS. Intubated in ED. GI consult. Octreotide, ceftriaxone initiated. Therapeutic paracentesis recommended. Mild coagulopathy > 2 units FFP administered. Hg 8.5 > one unit PRBCs. Plts 74K 6/22 - CT head: NAD 6/22 - EGD [facility]([place]): large mid to distal esophageal varices. Portal htn gastropathy. 7 bands placed. Will need repeat EGD 3-4 weeks 6/24 - weaning on PSV, hypokalemia / hypomagnesemia  6/25 - extubated, reintubated 6/27 - More alert, tolerating wean 6/28 - Improved mental status (able to state name / place) after starting lactulose/rifaximin, stronger resp status. MELD score 28 per GI, Ur Na < 20 05/29/14 - NOW DNAR, DNI, No HD but fuill medical care. Tx to SDU  SUBJECTIVE:      05/30/14: deconditioned, some delirium. Holding ground. Husband at bedside; wants to feed her. Poor urine ouput   VITAL SIGNS: Temp:  [97.4 F (36.3 C)-98.2 F (36.8 C)] 97.4 F (36.3 C) (06/30 1156) Pulse Rate:  [81-91] 88 (06/30 1156) Resp:  [8-12] 12 (06/30 1156) BP: (109-134)/(57-69) 134/64 mmHg (06/30 1156) SpO2:  [99 %-100 %] 99 % (06/30 1156) Weight:  [86.4 kg (190 lb 7.6 oz)] 86.4 kg (190 lb 7.6 oz) (06/30 0500)  HEMODYNAMICS:    VENTILATOR SETTINGS:     INTAKE / OUTPUT: Intake/Output     06/29 0701 - 06/30 0700 06/30 0701 - 07/01 0700   I.V. (mL/kg) 883.3 (10.2)    NG/GT 120    IV Piggyback     Total Intake(mL/kg) 1003.3 (11.6)    Urine (mL/kg/hr) 300 (0.1) 50 (0.1)   Stool 2100 (1)    Total Output 2400 50   Net -1396.7 -50          PHYSICAL EXAMINATION: General: chronically ill in NAD, very deconditioned Neuro: awake, hoarse voice, follows commands, some confuse HEENT: sclericterus Cardiovascular: RRR s M Lungs: reduced Abdomen: round / distended, bsx4 active Ext: symmetric LE edema, multiple ecchymoses Skin: multiple telangiectasias on chest, arms  LABS: PULMONARY  Recent Labs Lab 05/25/14 1533  PHART 7.305*  PCO2ART 34.8*  PO2ART 178.0*  HCO3 16.8*  TCO2 17.9  O2SAT 100.0   CBC  Recent Labs Lab 05/28/14 0359 05/29/14 0504 05/30/14 0500  HGB 8.2* 7.0* 7.7*  HCT 25.8* 21.8* 24.8*  WBC 5.1 3.1* 5.4  PLT 39* 28* 42*   COAGULATION  Recent Labs Lab 05/24/14 0500 05/25/14 1100 05/26/14 0500  INR 2.02* 1.91* 1.92*   CARDIAC  No results found for this basename: TROPONINI,  in the last 168 hours No results found for this basename: PROBNP,  in the last 168 hours  CHEMISTRY  Recent Labs Lab 05/24/14 0500 05/24/14 1615  05/28/14 0359 05/29/14 0504 05/29/14 1507 05/29/14 2210 05/30/14 1010  NA 145 144  < >  152* 159* 157* 159* 161*  K 2.9* 3.3*  < > 3.8 3.9 3.7 3.5* 3.3*  CL 106 106  < > 116* 122* 123* 125* 126*  CO2 17* 17*  < > 17* 18* 16* 18* 19  GLUCOSE 109* 111*  < > 144* 132* 125* 127* 132*  BUN 41* 38*  < > 35* 33* 33* 33* 32*  CREATININE 3.77* 3.64*  < > 3.61* 3.57* 3.63* 3.60* 3.61*  CALCIUM 6.3* 6.4*  < > 8.4 9.1 9.0 8.9 8.8  MG 0.9* 1.3*  --  1.5  --   --   --   --   PHOS 4.7*  --   --   --   --   --   --   --   < > = values in this interval not displayed. Estimated Creatinine Clearance: 20.3 ml/min (by C-G formula based on Cr of 3.61).  LIVER  Recent Labs Lab 05/24/14 0500  05/25/14 1100 05/26/14 0500 05/26/14 0930  AST  --   --   --  43*  ALT  --   --   --  17  ALKPHOS  --   --   --  157*  BILITOT  --   --   --  2.1*  PROT  --   --   --  6.3  ALBUMIN  --   --   --  2.7*  INR 2.02* 1.91* 1.92*  --    INFECTIOUS No results found for this basename: LATICACIDVEN, PROCALCITON,  in the last 168 hours ENDOCRINE CBG (last 3)   Recent Labs  05/29/14 1647 05/29/14 2238 05/30/14 1151  GLUCAP 117* 114* 122*     IMAGING x48h  Dg Chest Port 1 View  05/29/2014   CLINICAL DATA:  Acute respiratory failure. Status post extubation 05/27/2014.  EXAM: PORTABLE CHEST - 1 VIEW  COMPARISON:  Single view of the chest 05/28/2014 and 05/26/2014.  FINDINGS: Feeding tube and left IJ catheter remain in place, unchanged. Lung volumes are low but the lungs are clear. Heart size is normal. There is no pneumothorax or pleural effusion.  IMPRESSION: No acute finding in a low volume chest.   Electronically Signed   By: Drusilla Kannerhomas  Dalessio M.D.   On: 05/29/2014 07:53    ASSESSMENT / PLAN:  PULMONARY A: Acute resp failure - due to metabolic/ hepatic encephalopathy. Sp extubation 05/27/14   - doing well off vent  P:   Mobilize as able O2 to keep sats > 92% Aggressive pulmonary hygiene  Allow neg balance  CARDIOVASCULAR A:  H/O HTN P:  Hold antihypertensives Tolerated lasix Tele   RENAL A:  Acute renal failure (baseline Cr 0.9) - relative hypovolemia (vs hepatorenal - doubt), r/o ATN, UA had castsh. 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 Family denies prior alcoholism.  Prior negative autoimmune work up.  Marland Kitchen. UGIB due to esophageal variceal  bleeding  - S/P EGD, variceal banding 05/22/14. Octreotide infusion ended 6/25 Large to moderate ascites - Negative for SBP as recently as 05/23/14   - improving hepatic encephalopathy  P:   SUP: PPI Lactulose and Rifaximin via panda (panda benefit outweighs risk). Needs EGD 3-4 weeks from 05/22/14 Liver transplant eval at some point if she survives this hospitalization I would like to feed her, d/w GI, if tolerates ice chips: call speech Continue keep panda in for another 1 to few/several days till we are confident she is awake enough to swallow and stays that way without decompensation   HEMATOLOGIC A:   Acute blood loss anemia Coagulopathic (secondary to liver disease)  Thrombocytopenia P:  DVT px: SCDs Cbc in am  Pos balance may continued to drop plat, monnitor  INFECTIOUS A:   At risk for SBP in setting of variceal bleeding - negative fluid / cultures from para 6/23 P:   Monitor fever curve  ENDOCRINE A:  H/O DM II Risk of hypoglycemia in setting of renal and hepatic failure P:   Monitor CBG q 4 hrs To d5w   NEUROLOGIC A:   Hepatic encephalopathy - improving on oral lactulose & xifaxan History of prior seizure though due to alprazolam withdrawal Chronic alprazolam use    -persistent but improving encephalopathy P:   RASS goal: N/A Scheduled Lactulose and xifaxan  - improved PT  GLOBAL 05/30/14: husband updated. Tx care to [facility] 05/31/14 and pccm off  Dr. Kalman Shan, M.D., [facility].C.P Pulmonary and Critical Care Medicine Staff Physician [facility] Pulmonary and Critical Care Pager: [phone], If no answer or between  15:00h - 7:00h: call [phone]  05/30/2014 1:13 PM

## 2014-05-31 DIAGNOSIS — F1910 Other psychoactive substance abuse, uncomplicated: Secondary | ICD-10-CM

## 2014-05-31 DIAGNOSIS — I517 Cardiomegaly: Secondary | ICD-10-CM

## 2014-05-31 DIAGNOSIS — F17200 Nicotine dependence, unspecified, uncomplicated: Secondary | ICD-10-CM

## 2014-05-31 DIAGNOSIS — Z8669 Personal history of other diseases of the nervous system and sense organs: Secondary | ICD-10-CM

## 2014-05-31 DIAGNOSIS — I8500 Esophageal varices without bleeding: Secondary | ICD-10-CM

## 2014-05-31 LAB — AMMONIA: AMMONIA: 25 umol/L (ref 11–60)

## 2014-05-31 LAB — COMPREHENSIVE METABOLIC PANEL
ALK PHOS: 162 U/L — AB (ref 39–117)
ALT: 16 U/L (ref 0–35)
ANION GAP: 16 — AB (ref 5–15)
AST: 48 U/L — ABNORMAL HIGH (ref 0–37)
Albumin: 3.3 g/dL — ABNORMAL LOW (ref 3.5–5.2)
BILIRUBIN TOTAL: 1.7 mg/dL — AB (ref 0.3–1.2)
BUN: 29 mg/dL — AB (ref 6–23)
CHLORIDE: 125 meq/L — AB (ref 96–112)
CO2: 17 mEq/L — ABNORMAL LOW (ref 19–32)
Calcium: 8.5 mg/dL (ref 8.4–10.5)
Creatinine, Ser: 3.33 mg/dL — ABNORMAL HIGH (ref 0.50–1.10)
GFR calc non Af Amer: 15 mL/min — ABNORMAL LOW (ref >90)
GFR, EST AFRICAN AMERICAN: 18 mL/min — AB (ref >90)
Glucose, Bld: 144 mg/dL — ABNORMAL HIGH (ref 70–99)
Potassium: 3.6 mEq/L — ABNORMAL LOW (ref 3.7–5.3)
Sodium: 158 mEq/L — ABNORMAL HIGH (ref 137–147)
Total Protein: 6.6 g/dL (ref 6.0–8.3)

## 2014-05-31 LAB — PROTIME-INR
INR: 2.44 — ABNORMAL HIGH (ref 0.00–1.49)
Prothrombin Time: 26.5 seconds — ABNORMAL HIGH (ref 11.6–15.2)

## 2014-05-31 LAB — GLUCOSE, CAPILLARY
Glucose-Capillary: 134 mg/dL — ABNORMAL HIGH (ref 70–99)
Glucose-Capillary: 131 mg/dL — ABNORMAL HIGH (ref 70–99)

## 2014-05-31 LAB — MAGNESIUM: MAGNESIUM: 1.7 mg/dL (ref 1.5–2.5)

## 2014-05-31 MED ORDER — POTASSIUM CHLORIDE 10 MEQ/100ML IV SOLN
10.0000 meq | INTRAVENOUS | Status: AC
  Administered 2014-05-31 (×3): 10 meq via INTRAVENOUS
  Filled 2014-05-31 (×3): qty 100

## 2014-05-31 MED ORDER — FREE WATER
200.0000 mL | Freq: Four times a day (QID) | Status: DC
  Administered 2014-05-31: 200 mL

## 2014-05-31 MED ORDER — FREE WATER
250.0000 mL | Status: DC
  Administered 2014-05-31 – 2014-06-01 (×6): 250 mL

## 2014-05-31 MED ORDER — GLUCERNA 1.2 CAL PO LIQD
1000.0000 mL | ORAL | Status: DC
  Administered 2014-05-31 – 2014-06-04 (×6): 1000 mL
  Filled 2014-05-31 (×10): qty 1000

## 2014-05-31 NOTE — Progress Notes (Signed)
PT Cancellation Note  Patient Details Name: Dana MatterMelissa B Petersen MRN: [id number] DOB: 1965/12/05   Cancelled Treatment:    Reason Eval/Treat Not Completed: Medical issues which prohibited therapy Discussed pt status with Dana StandardAllison, Dana Petersen, who requested we hold PT today, and check back for appropriateness of PT over the next few days;   Dana Petersen, Dana Petersen [facility]amff 05/31/2014, 10:23 AM

## 2014-05-31 NOTE — Progress Notes (Signed)
S: Husband at beside, patient not responsive. But husband says pt opened her eyes this morning, but did not talk or respond to questions.    O:BP 145/75  Pulse 92  Temp(Src) 98.9 F (37.2 C) (Oral)  Resp 10  Ht 5\' 4"  (1.626 m)  Wt 189 lb 13.1 oz (86.1 kg)  BMI 32.57 kg/m2  SpO2 100%  LMP 05/25/2014  Intake/Output Summary (Last 24 hours) at 05/31/14 1204 Last data filed at 05/31/14 0800  Gross per 24 hour  Intake   1560 ml  Output   1350 ml  Net    210 ml   Intake/Output: I/O last 3 completed shifts: In: 1985 [I.V.:1925; NG/GT:60] Out: 2700 [Urine:700; Stool:2000]  Intake/Output this shift:  Total I/O In: 125 [I.V.:125] Out: -  Weight change: -10.6 oz (-0.3 kg)  Gen: lying in bed not in any distress, not responsive to name, touch.    HEENT- Does not obey commands. CVS: Regular, no added sounds Resp: Moving equal vols of air. Abd: marked distention, tense, patient did not respond to palpation/pressure. Ext: Pitting pedal edema Neuro- Blinking eyes while closed, Sternal rub- localizes pain.   Recent Labs Lab 05/26/14 0500 05/26/14 0930 05/28/14 0359 05/29/14 0504 05/29/14 1507 05/29/14 2210 05/30/14 1010 05/30/14 2200  NA 145  --  152* 159* 157* 159* 161* 158*  K 3.6*  --  3.8 3.9 3.7 3.5* 3.3* 3.1*  CL 108  --  116* 122* 123* 125* 126* 123*  CO2 16*  --  17* 18* 16* 18* 19 18*  GLUCOSE 175*  --  144* 132* 125* 127* 132* 149*  BUN 38*  --  35* 33* 33* 33* 32* 31*  CREATININE 3.43*  --  3.61* 3.57* 3.63* 3.60* 3.61* 3.43*  ALBUMIN  --  2.7*  --   --   --   --   --   --   CALCIUM 7.8*  --  8.4 9.1 9.0 8.9 8.8 8.9  AST  --  43*  --   --   --   --   --   --   ALT  --  17  --   --   --   --   --   --    Liver Function Tests:  Recent Labs Lab 05/26/14 0930  AST 43*  ALT 17  ALKPHOS 157*  BILITOT 2.1*  PROT 6.3  ALBUMIN 2.7*    Recent Labs Lab 05/27/14 1800  AMMONIA 26   CBC:  Recent Labs Lab 05/25/14 0500 05/26/14 0500 05/28/14 0359  05/29/14 0504 05/30/14 0500  WBC 6.7 6.5 5.1 3.1* 5.4  NEUTROABS  --   --   --   --  3.4  HGB 7.5* 8.0* 8.2* 7.0* 7.7*  HCT 22.9* 24.4* 25.8* 21.8* 24.8*  MCV 93.1 93.1 97.0 98.2 100.0  PLT 34* 43* 39* 28* 42*   Cardiac Enzymes: No results found for this basename: CKTOTAL, CKMB, CKMBINDEX, TROPONINI,  in the last 168 hours CBG:  Recent Labs Lab 05/28/14 2354 05/29/14 1647 05/29/14 2238 05/30/14 1151  GLUCAP 108* 117* 114* 122*    Iron Studies: No results found for this basename: IRON, TIBC, TRANSFERRIN, FERRITIN,  in the last 72 hours Studies/Results: No results found. Marland Kitchen. antiseptic oral rinse  15 mL Mouth Rinse QID  . calamine   Topical TID  . chlorhexidine  15 mL Mouth Rinse BID  . free water  200 mL Per Tube Q6H  . lactulose  45 g Oral  Q6H  . pantoprazole sodium  40 mg Per Tube Q12H  . potassium chloride  10 mEq Intravenous Q1 Hr x 3  . rifaximin  550 mg Per Tube BID    BMET    Component Value Date/Time   NA 158* 05/30/2014 2200   NA 138 04/19/2013 1109   K 3.1* 05/30/2014 2200   K 3.0* 04/19/2013 1109   CL 123* 05/30/2014 2200   CL 99 04/19/2013 1109   CO2 18* 05/30/2014 2200   CO2 27 04/19/2013 1109   GLUCOSE 149* 05/30/2014 2200   GLUCOSE 115* 04/19/2013 1109   BUN 31* 05/30/2014 2200   BUN 11.0 04/19/2013 1109   CREATININE 3.43* 05/30/2014 2200   CREATININE 0.8 04/19/2013 1109   CALCIUM 8.9 05/30/2014 2200   CALCIUM 7.5* 04/19/2013 1109   GFRNONAA 15* 05/30/2014 2200   GFRAA 17* 05/30/2014 2200   CBC    Component Value Date/Time   WBC 5.4 05/30/2014 0500   WBC 2.9* 04/19/2013 1109   RBC 2.48* 05/30/2014 0500   RBC 3.77 04/19/2013 1109   HGB 7.7* 05/30/2014 0500   HGB 12.3 04/19/2013 1109   HCT 24.8* 05/30/2014 0500   HCT 36.0 04/19/2013 1109   PLT 42* 05/30/2014 0500   PLT 39* 04/19/2013 1109   MCV 100.0 05/30/2014 0500   MCV 95.4 04/19/2013 1109   MCH 31.0 05/30/2014 0500   MCH 32.6 04/19/2013 1109   MCHC 31.0 05/30/2014 0500   MCHC 34.2 04/19/2013 1109   RDW 21.9*  05/30/2014 0500   RDW 15.8* 04/19/2013 1109   LYMPHSABS 1.2 05/30/2014 0500   LYMPHSABS 0.5* 04/19/2013 1109   MONOABS 0.4 05/30/2014 0500   MONOABS 0.2 04/19/2013 1109   EOSABS 0.3 05/30/2014 0500   EOSABS 0.1 04/19/2013 1109   BASOSABS 0.1 05/30/2014 0500   BASOSABS 0.0 04/19/2013 1109     Assessment/Plan:  1. 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 Currently on D5-1/2NS 16025mls/hr, also free water started yesterday. Give D5W, stop d51/2 NS 2. Hypernatremia- her free water deficit is 6.5Liters. Currently on 1/2 NS, started on free water 250mls Q6H 3. 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  4. Hepatic encephalopathy- On lactulose, without improvement.  5. Cirrhosis- ?NASH. Has had an overall decline over the last year per her husband. Will need transplant referral. 6. 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 Mariea ClontsEmokpae.  Unfortunately her labs were not drawn despite having AKI and hypernatremia.  Will check them today.  Recommend starting free water boluses via NGT to help with hypernatremia.  Her mental status has improved somewhat today and cont with current therapy. Leea Rambeau A,MD 05/31/2014 2:08 PM

## 2014-05-31 NOTE — Progress Notes (Signed)
Speech Language Pathology Treatment: Dysphagia  Patient Details Name: Dana Petersen MRN: [id number] DOB: 04/04/1966 Today's Date: 05/31/2014 Time: [id number] SLP Time Calculation (min): 23 min  Assessment / Plan / Recommendation Clinical Impression   Pt appears more lethargic today than as described on previous date. SLP provided Total A for oral care via suctioning and Max cues for arousal. Pt continues to demonstrate suspected delayed swallow initiation with reduced hyolaryngeal movement to palpation. [name] husband was present and educated regarding current aspiration risk and recommendation to continue NPO with ice chips only after oral care.    HPI HPI: 48 year old [facility]. PMH significant for GERD, hiatal hernia, non-alcoholic cirrhosis, hepatic encephalopathy ([facility]   Pertinent Vitals n/a  SLP Plan  Continue with current plan of care    Recommendations Diet recommendations: NPO;Other(comment) (ice chips after oral care from RN/SLP) Medication Administration: Via alternative means              Oral Care Recommendations: Oral care Q4 per protocol Follow up Recommendations: Skilled Nursing facility Plan: Continue with current plan of care    GO      Maxcine HamLaura Paiewonsky, M.A. CCC-SLP [phone]  [name], Farheen Pfahler 05/31/2014, 11:46 AM

## 2014-05-31 NOTE — Progress Notes (Addendum)
NUTRITION FOLLOW UP  Intervention:    D/C Glucerna Shake PO BID Initiate Glucerna 1.2 formula via Panda tube at 20 ml/hr and increase by 10 ml every 4 hours to goal rate of 60 ml/hr to provide 1728 kcals, 86 gm protein, 1159 ml of free water RD to follow for nutrition care plan  Nutrition Dx:   Inadequate oral intake now related to inability to eat as evidenced by NPO status, ongoing  Goal:   Intake to meet >90% of estimated nutrition needs, currently unmet  Monitor:   TF regimen & tolerance, PO diet advancement, weight, labs, I/O's  Assessment:   48 year old female with a PMH of cirrhosis, LA Grade A esophagitis, GERD, and DM who presents to the hospital with hematemesis and decreased LOC.  [facility].  05/22/14 EGD with seven bands placed to large mid to distal esoph varieces (Dr Elnoria HowardHung), portal gastropathy.  Patient was extubated on 6/27.  Transferred to 2C-Stepdown 6/29.  S/p bedside swallow evaluation 6/30.  SLP recommending NPO status at this time.  Panda tube placed 6/25 (tip projected over distal stomach).    RD spoke with Dana MoccasinSarah Gribbin, PA-C via telephone.  Full Liquid diet still active -- RD to discontinue.   Will also order continuous TF via Panda tube.  Height: Ht Readings from Last 1 Encounters:  05/22/14 5\' 4"  (1.626 m)    Weight Status:  Trending down with negative fluid status Wt Readings from Last 1 Encounters:  05/31/14 189 lb 13.1 oz (86.1 kg)  05/22/14 204 lb 5.9 oz (92.7 kg)  Re-estimated needs:  Kcal: 1600-1800 Protein: 80-90 gm Fluid: 1.6-1.8 L  Skin: no wounds  Diet Order: NPO   Intake/Output Summary (Last 24 hours) at 05/31/14 1141 Last data filed at 05/31/14 0800  Gross per 24 hour  Intake   1560 ml  Output   1350 ml  Net    210 ml   Labs:   Recent Labs Lab 05/24/14 1615  05/28/14 0359  05/29/14 2210 05/30/14 1010 05/30/14 2200  NA 144  < > 152*  < > 159* 161*  158*  K 3.3*  < > 3.8  < > 3.5* 3.3* 3.1*  CL 106  < > 116*  < > 125* 126* 123*  CO2 17*  < > 17*  < > 18* 19 18*  BUN 38*  < > 35*  < > 33* 32* 31*  CREATININE 3.64*  < > 3.61*  < > 3.60* 3.61* 3.43*  CALCIUM 6.4*  < > 8.4  < > 8.9 8.8 8.9  MG 1.3*  --  1.5  --   --   --   --   GLUCOSE 111*  < > 144*  < > 127* 132* 149*  < > = values in this interval not displayed.  CBG (last 3)   Recent Labs  05/29/14 1647 05/29/14 2238 05/30/14 1151  GLUCAP 117* 114* 122*    Scheduled Meds: . antiseptic oral rinse  15 mL Mouth Rinse QID  . calamine   Topical TID  . chlorhexidine  15 mL Mouth Rinse BID  . free water  200 mL Per Tube Q6H  . lactulose  45 g Oral Q6H  . pantoprazole sodium  40 mg Per Tube Q12H  . potassium chloride  10 mEq Intravenous Q1 Hr x 3  . rifaximin  550 mg Per Tube BID    Continuous Infusions: . dextrose 5 % and  0.45% NaCl 125 mL/hr at 05/30/14 2200    Dana Petersen, RD, LDN Pager #: [phone] After-Hours Pager #: [phone]

## 2014-05-31 NOTE — Progress Notes (Signed)
[facility]  Dana Petersen ZOX:[id number] DOB: 14-Jun-1966 DOA: 05/22/2014 PCP: [name], MD  Time spent :  Brief narrative: 26 F with cirrhosis of unclear etiology although does have prior history of polysubstance abuse including alcohol. Presented to [facility] ED 6/22 AM with hematemesis and decreased LOC. [facility] after hospitalization for hepatic encephalopathy. Due to inability to protect airway she was intubated. GI was consulted and she was also placed on Octerotide infusion. EGD revealed large esophageal varices (requiring 7 bands) and portal gastropathy. She also underwent large volume paracentesis with 10,000 cc removed. She was eventually extubated and after treatment for encephalopathy she was able to awaken somewhat by 6/28. She was subsequently made a DNR this admission.  HPI/Subjective: Pt unresponsive today- spoke at bedside with boyfriend  Assessment/Plan: Active Problems:   Cryptogenic cirrhosis: A) Ascites B) Coagulopathy C) Thrombocytopenia D) Esophageal varices and portal gastropathy -initial dx in 2009 and felt to be 2/2 ETOH but family denies pt with prior alcoholism so ?? NASH vs auto immune?? -MELD score 31 which equals a 52% mortality and Child-Pugh score is 14 which equals a Class C -currently no signs of bleeding -GI has documented if can get pt stable that should be considered for transplant list so will obtain ECHO esp in setting of acute renal failure -due to GIB GI recommend cont empiric Rocephin for SBP prophylaxis    Hepatic encephalopathy -receiving Rifaximin and Lactulose per tube -mentation remains altered -unable to safely take PO's so PANDA tube placed    Upper GI bleed due to esophageal varices -on PPI per tube -GI recommends repeat EGD around July 22     Acute respiratory failure with hypoxemia -primarily intubated for airway protection -CXR 6/29 unremarkable for PNA   History SEIZURE, 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 due to Alprazolam withdrawal    HYPERTENSION -controlled off meds    Acute blood loss anemia -admission hgb was 12 and now hanging around 7.7    Acute renal failure/hypernatremia -Renal following and suspects pre renal etiology and not hepatorenal syndrome -start free water per tube as recommended    DEPRESSIVE DISORDER, RCR, MODERATE    GERD    Substance abuse/Nicotine abuse -unclear if pt utilized regular alcohol either surreptitiously or culturally family does not identify the degree to which she drank as problematic or high ie they denied she was "alcoholic"  DVT prophylaxis: SCDs Code Status: DO NOT RESUSCITATE Family Communication: Significant other at bedside-discussed poor prognosis and possibility that if renal function does not improve significantly over the next 5-7 days that we may need to explore comfort measures Disposition Plan/Expected LOS: Stepdown   Consultants: Gastroenterology Critical care medicine Nephrology  Procedures: ETT6/22 >> 6/25, 6/25 >> 6/27  L IJ CVL 6/22 >>  2-D echocardiogram pending     CULTURES:  UC 6/22 >> negative  Resp 6/22 >> mod staph >> MRSA  Blood 6/22 >> canceled / not drawn  Peritoneal Fluid 6/22 >> rare WBC >> negative  Antibiotics: Ceftriaxone (SBP px) 6/22 >> (plan d/c 6/28   Objective: Blood pressure 145/75, pulse 92, temperature 98.9 F (37.2 C), temperature source Oral, resp. rate 10, height 5\' 4"  (1.626 m), weight 189 lb 13.1 oz (86.1 kg), last menstrual period 05/25/2014, SpO2 100.00%.  Intake/Output Summary (Last 24 hours) at 05/31/14 1349 Last data filed at 05/31/14 1200  Gross per 24 hour  Intake   1960 ml  Output   2250 ml  Net   -290 ml     Exam: General: Alert, Spontaneously opens eyes, does not follow commands,No acute respiratory distress-essentially unresponsive, (-) Jaundice Lungs: Clear to auscultation bilaterally without wheezes or crackles,  RA Cardiovascular: Regular rate and rhythm without murmur gallop or rub normal S1 and S2, no peripheral edema or JVD Abdomen: Nontender, distended with ascites, soft, bowel sounds positive, no rebound,no appreciable mass-Panda tube with continuous feedings infusing Musculoskeletal: No significant cyanosis, clubbing of bilateral lower extremities   Scheduled Meds:  Scheduled Meds: . antiseptic oral rinse  15 mL Mouth Rinse QID  . calamine   Topical TID  . chlorhexidine  15 mL Mouth Rinse BID  . free water  200 mL Per Tube Q6H  . lactulose  45 g Oral Q6H  . pantoprazole sodium  40 mg Per Tube Q12H  . rifaximin  550 mg Per Tube BID   Continuous Infusions: . dextrose 5 % and 0.45% NaCl 125 mL/hr at 05/30/14 2200  . feeding supplement (GLUCERNA 1.2 CAL)      Data Reviewed: Basic Metabolic Panel:  Recent Labs Lab 05/24/14 1615  05/28/14 0359 05/29/14 0504 05/29/14 1507 05/29/14 2210 05/30/14 1010 05/30/14 2200 05/31/14 1140  NA 144  < > 152* 159* 157* 159* 161* 158*  --   K 3.3*  < > 3.8 3.9 3.7 3.5* 3.3* 3.1*  --   CL 106  < > 116* 122* 123* 125* 126* 123*  --   CO2 17*  < > 17* 18* 16* 18* 19 18*  --   GLUCOSE 111*  < > 144* 132* 125* 127* 132* 149*  --   BUN 38*  < > 35* 33* 33* 33* 32* 31*  --   CREATININE 3.64*  < > 3.61* 3.57* 3.63* 3.60* 3.61* 3.43*  --   CALCIUM 6.4*  < > 8.4 9.1 9.0 8.9 8.8 8.9  --   MG 1.3*  --  1.5  --   --   --   --   --  1.7  < > = values in this interval not displayed. Liver Function Tests:  Recent Labs Lab 05/26/14 0930  AST 43*  ALT 17  ALKPHOS 157*  BILITOT 2.1*  PROT 6.3  ALBUMIN 2.7*   No results found for this basename: LIPASE, AMYLASE,  in the last 168 hours  Recent Labs Lab 05/27/14 1800 05/31/14 1140  AMMONIA 26 25   CBC:  Recent Labs Lab 05/25/14 0500 05/26/14 0500 05/28/14 0359 05/29/14 0504 05/30/14 0500  WBC 6.7 6.5 5.1 3.1* 5.4  NEUTROABS  --   --   --   --  3.4  HGB 7.5* 8.0* 8.2* 7.0* 7.7*  HCT 22.9*  24.4* 25.8* 21.8* 24.8*  MCV 93.1 93.1 97.0 98.2 100.0  PLT 34* 43* 39* 28* 42*   Cardiac Enzymes: No results found for this basename: CKTOTAL, CKMB, CKMBINDEX, TROPONINI,  in the last 168 hours BNP (last 3 results) No results found for this basename: PROBNP,  in the last 8760 hours CBG:  Recent Labs Lab 05/28/14 2354 05/29/14 1647 05/29/14 2238 05/30/14 1151 05/31/14 1247  GLUCAP 108* 117* 114* 122* 134*    Recent Results (from the past 240 hour(s))  MRSA PCR SCREENING     Status: Abnormal   Collection Time    05/22/14 11:22 AM      Result Value Ref Range Status   MRSA by PCR POSITIVE (*) NEGATIVE Final   Comment:  The GeneXpert MRSA Assay (FDA     approved for NASAL specimens     only), is one component of a     comprehensive MRSA colonization     surveillance program. It is not     intended to diagnose MRSA     infection nor to guide or     monitor treatment for     MRSA infections.     RESULT CALLED TO, READ BACK BY AND VERIFIED WITH:     ALona Kettle RN 12:55 05/22/14 ([email or web address])  URINE CULTURE     Status: None   Collection Time    05/22/14 12:35 PM      Result Value Ref Range Status   Specimen Description URINE, CATHETERIZED   Final   Special Requests Normal   Final   Culture  Setup Time     Final   Value: 05/22/2014 13:00     Performed at [facility] Count     Final   Value: NO GROWTH     Performed at [facility]   Culture     Final   Value: NO GROWTH     Performed at [facility]   Report Status 05/23/2014 FINAL   Final  BODY FLUID CULTURE     Status: None   Collection Time    05/23/14  3:22 PM      Result Value Ref Range Status   Specimen Description FLUID PERITONEAL   Final   Special Requests NONE   Final   Gram Stain     Final   Value: RARE WBC PRESENT, PREDOMINANTLY PMN     NO ORGANISMS SEEN     Performed at [facility]   Culture     Final   Value: NO GROWTH 3 DAYS     Performed at [facility]   Report Status 05/27/2014 FINAL   Final  CULTURE, RESPIRATORY (NON-EXPECTORATED)     Status: None   Collection Time    05/23/14  9:08 PM      Result Value Ref Range Status   Specimen Description TRACHEAL ASPIRATE   Final   Special Requests Normal   Final   Gram Stain     Final   Value: FEW WBC PRESENT,BOTH PMN AND MONONUCLEAR     RARE SQUAMOUS EPITHELIAL CELLS PRESENT     FEW GRAM POSITIVE COCCI IN PAIRS     IN CLUSTERS     Performed at [facility]   Culture     Final   Value: MODERATE METHICILLIN RESISTANT STAPHYLOCOCCUS AUREUS     Note: RIFAMPIN AND GENTAMICIN SHOULD NOT BE USED AS SINGLE DRUGS FOR TREATMENT OF STAPH INFECTIONS. CRITICAL RESULT CALLED TO, READ BACK BY AND VERIFIED WITH: CAMISHA@7 :53AM ON 05/26/14 BY DANTS     Performed at [facility]   Report Status 05/26/2014 FINAL   Final   Organism ID, Bacteria METHICILLIN RESISTANT STAPHYLOCOCCUS AUREUS   Final     Studies:  Recent x-ray studies have been reviewed in detail by the Attending Physician       Junious Silk, ANP [facility] Office  [phone] Pager [phone]   **If unable to reach the above provider after paging please contact the Flow Manager @ [phone]  On-Call/Text Page:      [email or web address]      password [email or web address]  If 7PM-7AM, please contact night-coverage [email or web address] Password [email or web address] 05/31/2014, 1:49 PM   LOS: 9 days  Examined patient with ANP Revonda StandardAllison discussed assessment and plan and agree with plan. Patient was given opportunity to ask questions; all questions were answered appropriately. Patient with multiple complex medical problems greater than 40 minutes used in direct patient care

## 2014-05-31 NOTE — Progress Notes (Signed)
  Echocardiogram 2D Echocardiogram has been performed.  Dana Petersen 05/31/2014, 4:39 PM

## 2014-05-31 NOTE — Progress Notes (Signed)
Daily Rounding Note  05/31/2014, 8:46 AM  LOS: 9 days   SUBJECTIVE:       Urine output 350 cc yesterday, none yet recorded today.  Stool output of 1000 cc.  Now getting Lactulose via tube along with Rifaximin.    OBJECTIVE:         Vital signs in last 24 hours:    Temp:  [97.4 F (36.3 C)-99 F (37.2 C)] 98.9 F (37.2 C) (07/01 0400) Pulse Rate:  [85-91] 91 (07/01 0400) Resp:  [8-13] 11 (07/01 0400) BP: (130-144)/(55-82) 144/82 mmHg (07/01 0400) SpO2:  [99 %-100 %] 100 % (07/01 0400) Weight:  [86.1 kg (189 lb 13.1 oz)] 86.1 kg (189 lb 13.1 oz) (07/01 0400) Last BM Date: 05/31/14 General: less verbal than yesterday. Looks comfortable   Heart: RRR.   Chest: clear bil.  No labored breathing Abdomen: tense, distended, BS hypoactive.  Not tender  Extremities: 1plus pedal edema, ansasarca Neuro/Psych:  More obtunded today.  Though eyes are open, she is not speaking nor following commands.   Intake/Output from previous day: 06/30 0701 - 07/01 0700 In: 1435 [I.V.:1375; NG/GT:60] Out: 1400 [Urine:400; Stool:1000]  Intake/Output this shift:    Lab Results:  Recent Labs  05/29/14 0504 05/30/14 0500  WBC 3.1* 5.4  HGB 7.0* 7.7*  HCT 21.8* 24.8*  PLT 28* 42*   BMET  Recent Labs  05/29/14 2210 05/30/14 1010 05/30/14 2200  NA 159* 161* 158*  K 3.5* 3.3* 3.1*  CL 125* 126* 123*  CO2 18* 19 18*  GLUCOSE 127* 132* 149*  BUN 33* 32* 31*  CREATININE 3.60* 3.61* 3.43*  CALCIUM 8.9 8.8 8.9   LFT No results found for this basename: PROT, ALBUMIN, AST, ALT, ALKPHOS, BILITOT, BILIDIR, IBILI,  in the last 72 hours PT/INR  Recent Labs  05/31/14 0500  LABPROT 26.5*  INR 2.44*    ASSESMENT:   * Advanced, decompensated cirrhosis.  * Variceal bleed. EGD with banding x 7 6/22 (Dr Elnoria HowardHung). On BID IV Protonix. No BB on board  * Ascites, anasarca. Diuretics on hold. Diagnostic paracentesis 6/23: no SBP.  Ascites  seems progressive to my exam.   Fearful of large volume paracentesis in pt with ARF, also pt not displaying abdominal tenderness.  * ARF. IV albumin given 6/28. possible pre-renal or HRS.  Dr Abel Prestoolodonato favors former etiology.  He suggested free water boluses, but I do not see orders for this. * Thrombocytopenia.  * Encephalopathy. On Rifaximin, Chronulac. Max ammonia 177 on 6/22. Persists in near coma state.  * Coagulopathy secondary to liver dysfunction.  * Anemia.  * Hypernatremia. Stable, persists.   * Hypokalemia. Worse.  *  DM2.  * Dysphagia.  BSS eval yesterday concluded ice chips only for suspected silent aspiration.  SLP may pursue further studies.      PLAN   *  Who will be ordering the free water *  Also should we initiate tube feeds given her unreliable and perhaps unsafe po status? Glucerna po BID on list but unable to give this.      Jennye MoccasinSarah Gribbin  05/31/2014, 8:46 AM Pager: [phone]  GI ATTENDING  Interval data and history reviewed. Patient personally seen and examined. Agree with H&P as above. Husband in room. Appreciate nephrology input. Clinically no significant change. Physical exam without significant change. RECOMMENDATIONS 1. Free water as recommended by nephrology 2. Initiate tube feeds. Would consult nutrition. 3. Keep head of bed 45  while feeding to reduce the risk of aspiration 4. Continue overall supportive care.  Wilhemina BonitoJohn N. Eda KeysPerry, Jr., M.D. [facility] Division of [facility]

## 2014-06-01 DIAGNOSIS — F329 Major depressive disorder, single episode, unspecified: Secondary | ICD-10-CM

## 2014-06-01 DIAGNOSIS — F3289 Other specified depressive episodes: Secondary | ICD-10-CM

## 2014-06-01 LAB — COMPREHENSIVE METABOLIC PANEL
ALBUMIN: 3 g/dL — AB (ref 3.5–5.2)
ALT: 19 U/L (ref 0–35)
AST: 64 U/L — ABNORMAL HIGH (ref 0–37)
Alkaline Phosphatase: 214 U/L — ABNORMAL HIGH (ref 39–117)
Anion gap: 15 (ref 5–15)
BUN: 27 mg/dL — ABNORMAL HIGH (ref 6–23)
CALCIUM: 7.8 mg/dL — AB (ref 8.4–10.5)
CO2: 18 meq/L — AB (ref 19–32)
Chloride: 126 mEq/L — ABNORMAL HIGH (ref 96–112)
Creatinine, Ser: 2.96 mg/dL — ABNORMAL HIGH (ref 0.50–1.10)
GFR calc Af Amer: 20 mL/min — ABNORMAL LOW (ref >90)
GFR, EST NON AFRICAN AMERICAN: 18 mL/min — AB (ref >90)
Glucose, Bld: 160 mg/dL — ABNORMAL HIGH (ref 70–99)
Potassium: 3.1 mEq/L — ABNORMAL LOW (ref 3.7–5.3)
Sodium: 159 mEq/L — ABNORMAL HIGH (ref 137–147)
Total Bilirubin: 1.5 mg/dL — ABNORMAL HIGH (ref 0.3–1.2)
Total Protein: 6.1 g/dL (ref 6.0–8.3)

## 2014-06-01 LAB — CBC
HCT: 23.8 % — ABNORMAL LOW (ref 36.0–46.0)
Hemoglobin: 7.4 g/dL — ABNORMAL LOW (ref 12.0–15.0)
MCH: 30.6 pg (ref 26.0–34.0)
MCHC: 31.1 g/dL (ref 30.0–36.0)
MCV: 98.3 fL (ref 78.0–100.0)
PLATELETS: 31 10*3/uL — AB (ref 150–400)
RBC: 2.42 MIL/uL — ABNORMAL LOW (ref 3.87–5.11)
RDW: 22.3 % — ABNORMAL HIGH (ref 11.5–15.5)
WBC: 4.3 10*3/uL (ref 4.0–10.5)

## 2014-06-01 LAB — GLUCOSE, CAPILLARY
GLUCOSE-CAPILLARY: 130 mg/dL — AB (ref 70–99)
GLUCOSE-CAPILLARY: 139 mg/dL — AB (ref 70–99)
GLUCOSE-CAPILLARY: 140 mg/dL — AB (ref 70–99)
Glucose-Capillary: 148 mg/dL — ABNORMAL HIGH (ref 70–99)
Glucose-Capillary: 160 mg/dL — ABNORMAL HIGH (ref 70–99)
Glucose-Capillary: 119 mg/dL — ABNORMAL HIGH (ref 70–99)
Glucose-Capillary: 141 mg/dL — ABNORMAL HIGH (ref 70–99)

## 2014-06-01 MED ORDER — FREE WATER
300.0000 mL | Status: DC
  Administered 2014-06-01 – 2014-06-02 (×8): 300 mL

## 2014-06-01 MED ORDER — BUTAMBEN-TETRACAINE-BENZOCAINE 2-2-14 % EX AERO
1.0000 | INHALATION_SPRAY | Freq: Three times a day (TID) | CUTANEOUS | Status: DC | PRN
  Filled 2014-06-01: qty 56

## 2014-06-01 MED ORDER — INSULIN ASPART 100 UNIT/ML ~~LOC~~ SOLN: 0.0000 [IU] | Freq: Every day | SUBCUTANEOUS | Status: DC

## 2014-06-01 MED ORDER — INSULIN ASPART 100 UNIT/ML ~~LOC~~ SOLN
0.0000 [IU] | Freq: Three times a day (TID) | SUBCUTANEOUS | Status: DC
  Administered 2014-06-02 – 2014-06-03 (×5): 2 [IU] via SUBCUTANEOUS
  Administered 2014-06-03 – 2014-06-04 (×2): 3 [IU] via SUBCUTANEOUS
  Administered 2014-06-04: 2 [IU] via SUBCUTANEOUS
  Administered 2014-06-04: 3 [IU] via SUBCUTANEOUS
  Administered 2014-06-05 – 2014-06-07 (×4): 2 [IU] via SUBCUTANEOUS
  Administered 2014-06-07: 3 [IU] via SUBCUTANEOUS
  Administered 2014-06-08 – 2014-06-09 (×3): 2 [IU] via SUBCUTANEOUS
  Administered 2014-06-09: 3 [IU] via SUBCUTANEOUS

## 2014-06-01 MED ORDER — POTASSIUM CHLORIDE 20 MEQ/15ML (10%) PO LIQD
20.0000 meq | Freq: Two times a day (BID) | ORAL | Status: DC
  Administered 2014-06-01 – 2014-06-04 (×8): 20 meq
  Filled 2014-06-01 (×10): qty 15

## 2014-06-01 MED ORDER — DEXTROSE 5 % IV SOLN
INTRAVENOUS | Status: DC
  Administered 2014-06-01 – 2014-06-03 (×2): via INTRAVENOUS
  Administered 2014-06-04: 75 mL via INTRAVENOUS
  Administered 2014-06-05 – 2014-06-09 (×5): via INTRAVENOUS

## 2014-06-01 MED ORDER — LACTULOSE 10 GM/15ML PO SOLN
30.0000 g | Freq: Two times a day (BID) | ORAL | Status: DC
  Administered 2014-06-01 – 2014-06-04 (×7): 30 g
  Filled 2014-06-01 (×9): qty 45

## 2014-06-01 MED ORDER — INSULIN ASPART 100 UNIT/ML ~~LOC~~ SOLN: 0.0000 [IU] | Freq: Three times a day (TID) | SUBCUTANEOUS | Status: DC

## 2014-06-01 MED ORDER — PANTOPRAZOLE SODIUM 40 MG PO PACK
40.0000 mg | PACK | Freq: Every day | ORAL | Status: DC
  Administered 2014-06-01 – 2014-06-02 (×2): 40 mg
  Filled 2014-06-01 (×2): qty 20

## 2014-06-01 NOTE — Progress Notes (Signed)
[facility]  Dana Petersen ZOX:[id number] DOB: [birth year] DOA: 05/22/2014 PCP: [name], MD  Time spent :  Brief narrative: 52 F with cirrhosis of unclear etiology although does have prior history of polysubstance abuse including alcohol. Presented to [facility] ED 6/22 AM with hematemesis and decreased LOC. Recently discharged from [facility] after hospitalization for hepatic encephalopathy. Due to inability to protect airway she was intubated. GI was consulted and she was also placed on Octerotide infusion. EGD revealed large esophageal varices (requiring 7 bands) and portal gastropathy. She also underwent large volume paracentesis with 10,000 cc removed. She was eventually extubated and after treatment for encephalopathy she was able to awaken somewhat by 6/28. She was subsequently made a DNR this admission.  HPI/Subjective: Alert and awake but not verbally communicating since is very hoarse and speaking has been quite difficult-smiles appropriately  Assessment/Plan: Active Problems:   Cryptogenic cirrhosis: A) Ascites B) Coagulopathy C) Thrombocytopenia D) Esophageal varices and portal gastropathy -more alert 7/2 -initial dx in 2009 and felt to be 2/2 ETOH but family denies pt with prior alcoholism so ?? NASH vs auto immune?? -MELD score 31 which equals a 52% mortality and Child-Pugh score is 14 which equals a Class C -currently no signs of bleeding -GI has documented if can get pt stable that should be considered for transplant list so will obtain ECHO esp in setting of acute renal failure -due to GIB GI recommend cont empiric Rocephin for SBP prophylaxis    Hepatic encephalopathy -receiving Rifaximin and Lactulose per tube-decrease Lactulose to 30 gm BID since alert ammonia normal on current tx -mentation improved -unable to safely take PO's so PANDA tube placed    Upper GI bleed due to esophageal varices -on PPI per tube -GI recommends  repeat EGD around July 22     Acute respiratory failure with hypoxemia -primarily intubated for airway protection -CXR 6/29 unremarkable for PNA    History SEIZURE, 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 due to Alprazolam withdrawal    HYPERTENSION -controlled off meds    Acute blood loss anemia -admission hgb was 12 and now hanging around 7.7 -BP stable so defer transfusion at this time    Acute renal failure/hypernatremia -Renal following and suspects pre renal etiology and not hepatorenal syndrome -cont free water per tube and dextrose IVFs    DEPRESSIVE DISORDER, RCR, MODERATE    GERD    Substance abuse/Nicotine abuse -unclear if pt utilized regular alcohol either surreptitiously or culturally family does not identify the degree to which she drank as problematic or high ie they denied she was "alcoholic"  Sore throat -Cetacaine spray TID PRN    DVT prophylaxis: SCDs Code Status: DO NOT RESUSCITATE Family Communication: Husband and daughter at bedside Disposition Plan/Expected LOS: Stepdown   Consultants: Gastroenterology Critical care medicine Nephrology  Procedures: ETT6/22 >> 6/25, 6/25 >> 6/27  L IJ CVL 6/22 >>  7/1 echocardiogram - Left ventricle: mild LVH. LVEF=  60%.  - Left atrium: mildly dilated.    CULTURES:  UC 6/22 >> negative  Resp 6/22 >> mod staph >> MRSA  Blood 6/22 >> canceled / not drawn  Peritoneal Fluid 6/22 >> rare WBC >> negative  Antibiotics: Ceftriaxone (SBP px) 6/22 >> (plan d/c 6/28   Objective: Blood pressure 131/68, pulse 88, temperature 98.8 F (37.1 C), temperature source Axillary, resp. rate 15, height 5\' 4"  (1.626 m), weight 191 lb 12.8 oz (87 kg), last menstrual period 05/25/2014, SpO2 100.00%.  Intake/Output Summary (Last 24 hours) at 06/01/14 1108 Last data filed at 06/01/14 [id number]  Gross per 24 hour  Intake 4942.5 ml  Output   2370 ml  Net 2572.5 ml     Exam: General: Alert and smiles with appropriate eye contact (-)  Jaundice Lungs: Clear to auscultation bilaterally without wheezes or crackles, RA Cardiovascular: Regular rate and rhythm without murmur gallop or rub normal S1 and S2, no peripheral edema or JVD Abdomen: Nontender, distended with ascites, soft, bowel sounds positive, no rebound,no appreciable mass-Panda tube with continuous feedings infusing Musculoskeletal: No significant cyanosis, clubbing of bilateral lower extremities   Scheduled Meds:  Scheduled Meds: . antiseptic oral rinse  15 mL Mouth Rinse QID  . calamine   Topical TID  . chlorhexidine  15 mL Mouth Rinse BID  . free water  250 mL Per Tube Q4H  . lactulose  30 g Per Tube BID  . pantoprazole sodium  40 mg Per Tube Daily  . potassium chloride  20 mEq Per Tube BID  . rifaximin  550 mg Per Tube BID   Continuous Infusions: . dextrose 5 % and 0.45% NaCl 125 mL/hr at 06/01/14 0021  . feeding supplement (GLUCERNA 1.2 CAL) 1,000 mL (06/01/14 0445)    Data Reviewed: Basic Metabolic Panel:  Recent Labs Lab 05/28/14 0359  05/29/14 2210 05/30/14 1010 05/30/14 2200 05/31/14 1140 06/01/14 0500  NA 152*  < > 159* 161* 158* 158* 159*  K 3.8  < > 3.5* 3.3* 3.1* 3.6* 3.1*  CL 116*  < > 125* 126* 123* 125* 126*  CO2 17*  < > 18* 19 18* 17* 18*  GLUCOSE 144*  < > 127* 132* 149* 144* 160*  BUN 35*  < > 33* 32* 31* 29* 27*  CREATININE 3.61*  < > 3.60* 3.61* 3.43* 3.33* 2.96*  CALCIUM 8.4  < > 8.9 8.8 8.9 8.5 7.8*  MG 1.5  --   --   --   --  1.7  --   < > = values in this interval not displayed. Liver Function Tests:  Recent Labs Lab 05/26/14 0930 05/31/14 1140 06/01/14 0500  AST 43* 48* 64*  ALT 17 16 19   ALKPHOS 157* 162* 214*  BILITOT 2.1* 1.7* 1.5*  PROT 6.3 6.6 6.1  ALBUMIN 2.7* 3.3* 3.0*   No results found for this basename: LIPASE, AMYLASE,  in the last 168 hours  Recent Labs Lab 05/27/14 1800 05/31/14 1140  AMMONIA 26 25   CBC:  Recent Labs Lab 05/26/14 0500 05/28/14 0359 05/29/14 0504 05/30/14 0500  06/01/14 0500  WBC 6.5 5.1 3.1* 5.4 4.3  NEUTROABS  --   --   --  3.4  --   HGB 8.0* 8.2* 7.0* 7.7* 7.4*  HCT 24.4* 25.8* 21.8* 24.8* 23.8*  MCV 93.1 97.0 98.2 100.0 98.3  PLT 43* 39* 28* 42* 31*   Cardiac Enzymes: No results found for this basename: CKTOTAL, CKMB, CKMBINDEX, TROPONINI,  in the last 168 hours BNP (last 3 results) No results found for this basename: PROBNP,  in the last 8760 hours CBG:  Recent Labs Lab 05/31/14 1247 05/31/14 2049 06/01/14 0012 06/01/14 0456 06/01/14 0748  GLUCAP 134* 131* 130* 139* 148*    Recent Results (from the past 240 hour(s))  MRSA PCR SCREENING     Status: Abnormal   Collection Time    05/22/14 11:22 AM      Result Value Ref Range Status   MRSA by PCR POSITIVE (*)  NEGATIVE Final   Comment:            The GeneXpert MRSA Assay (FDA     approved for NASAL specimens     only), is one component of a     comprehensive MRSA colonization     surveillance program. It is not     intended to diagnose MRSA     infection nor to guide or     monitor treatment for     MRSA infections.     RESULT CALLED TO, READ BACK BY AND VERIFIED WITH:     ALona Kettle RN 12:55 05/22/14 ([email or web address])  URINE CULTURE     Status: None   Collection Time    05/22/14 12:35 PM      Result Value Ref Range Status   Specimen Description URINE, CATHETERIZED   Final   Special Requests Normal   Final   Culture  Setup Time     Final   Value: 05/22/2014 13:00     Performed at [facility] Count     Final   Value: NO GROWTH     Performed at [facility]   Culture     Final   Value: NO GROWTH     Performed at [facility]   Report Status 05/23/2014 FINAL   Final  BODY FLUID CULTURE     Status: None   Collection Time    05/23/14  3:22 PM      Result Value Ref Range Status   Specimen Description FLUID PERITONEAL   Final   Special Requests NONE   Final   Gram Stain     Final   Value: RARE WBC PRESENT, PREDOMINANTLY PMN     NO  ORGANISMS SEEN     Performed at [facility]   Culture     Final   Value: NO GROWTH 3 DAYS     Performed at [facility]   Report Status 05/27/2014 FINAL   Final  CULTURE, RESPIRATORY (NON-EXPECTORATED)     Status: None   Collection Time    05/23/14  9:08 PM      Result Value Ref Range Status   Specimen Description TRACHEAL ASPIRATE   Final   Special Requests Normal   Final   Gram Stain     Final   Value: FEW WBC PRESENT,BOTH PMN AND MONONUCLEAR     RARE SQUAMOUS EPITHELIAL CELLS PRESENT     FEW GRAM POSITIVE COCCI IN PAIRS     IN CLUSTERS     Performed at [facility]   Culture     Final   Value: MODERATE METHICILLIN RESISTANT STAPHYLOCOCCUS AUREUS     Note: RIFAMPIN AND GENTAMICIN SHOULD NOT BE USED AS SINGLE DRUGS FOR TREATMENT OF STAPH INFECTIONS. CRITICAL RESULT CALLED TO, READ BACK BY AND VERIFIED WITH: CAMISHA@7 :53AM ON 05/26/14 BY DANTS     Performed at [facility]   Report Status 05/26/2014 FINAL   Final   Organism ID, Bacteria METHICILLIN RESISTANT STAPHYLOCOCCUS AUREUS   Final     Studies:  Recent x-ray studies have been reviewed in detail by the Attending Physician       Junious Silk, ANP [facility] Office  [phone] Pager [phone]   **If unable to reach the above provider after paging please contact the Flow Manager @ [phone]  On-Call/Text Page:      [email or web address]      password [email or web address]  If 7PM-7AM,  please contact night-coverage [email or web address] Password [email or web address] 06/01/2014, 11:08 AM   LOS: 10 days   Examining patient, discussed assessment and plan with ANP Revonda StandardAllison, and agree with plan. Discussed goals of care with patient and husband and answered all questions. Patient with multiple complex health problems, direct patient care> 40 minutes

## 2014-06-01 NOTE — Progress Notes (Addendum)
S: Appears better today, family at bedside.   O:BP 131/68  Pulse 88  Temp(Src) 98.8 F (37.1 C) (Axillary)  Resp 15  Ht 5\' 4"  (1.626 m)  Wt 191 lb 12.8 oz (87 kg)  BMI 32.91 kg/m2  SpO2 100%  LMP 05/25/2014  Intake/Output Summary (Last 24 hours) at 06/01/14 0913 Last data filed at 06/01/14 0752  Gross per 24 hour  Intake 4942.5 ml  Output   2370 ml  Net 2572.5 ml   Intake/Output: I/O last 3 completed shifts: In: 6377.5 [I.V.:4125; NG/GT:2252.5] Out: 3000 [Urine:775; Stool:2225]  Intake/Output this shift:  Total I/O In: -  Out: 45 [Urine:45] Weight change: 1 lb 15.7 oz (0.9 kg)  Gen: Not in any distress, responds to some commands, tracks with her eyes.  HEENT- AT, [place], Eyes open, oral mucosa dry. CVS: Regular rate, no added sounds Resp: Clar to auscultation bilat Abd: Distended, not tender to palpation, bowel sounds not heard, likely due to distension, fluid thrill present. Ext: Warm and well perfused, pitting pedal edema. Neuro- Responds tro some commands, oriented to place.   Recent Labs Lab 05/26/14 0930  05/29/14 0504 05/29/14 1507 05/29/14 2210 05/30/14 1010 05/30/14 2200 05/31/14 1140 06/01/14 0500  NA  --   < > 159* 157* 159* 161* 158* 158* 159*  K  --   < > 3.9 3.7 3.5* 3.3* 3.1* 3.6* 3.1*  CL  --   < > 122* 123* 125* 126* 123* 125* 126*  CO2  --   < > 18* 16* 18* 19 18* 17* 18*  GLUCOSE  --   < > 132* 125* 127* 132* 149* 144* 160*  BUN  --   < > 33* 33* 33* 32* 31* 29* 27*  CREATININE  --   < > 3.57* 3.63* 3.60* 3.61* 3.43* 3.33* 2.96*  ALBUMIN 2.7*  --   --   --   --   --   --  3.3* 3.0*  CALCIUM  --   < > 9.1 9.0 8.9 8.8 8.9 8.5 7.8*  AST 43*  --   --   --   --   --   --  48* 64*  ALT 17  --   --   --   --   --   --  16 19  < > = values in this interval not displayed. Liver Function Tests:  Recent Labs Lab 05/26/14 0930 05/31/14 1140 06/01/14 0500  AST 43* 48* 64*  ALT 17 16 19   ALKPHOS 157* 162* 214*  BILITOT 2.1* 1.7* 1.5*  PROT 6.3  6.6 6.1  ALBUMIN 2.7* 3.3* 3.0*    Recent Labs Lab 05/27/14 1800 05/31/14 1140  AMMONIA 26 25   CBC:  Recent Labs Lab 05/26/14 0500 05/28/14 0359 05/29/14 0504 05/30/14 0500 06/01/14 0500  WBC 6.5 5.1 3.1* 5.4 4.3  NEUTROABS  --   --   --  3.4  --   HGB 8.0* 8.2* 7.0* 7.7* 7.4*  HCT 24.4* 25.8* 21.8* 24.8* 23.8*  MCV 93.1 97.0 98.2 100.0 98.3  PLT 43* 39* 28* 42* 31*   CBG:  Recent Labs Lab 05/31/14 1247 05/31/14 2049 06/01/14 0012 06/01/14 0456 06/01/14 0748  GLUCAP 134* 131* 130* 139* 148*   Studies/Results: No results found. Marland Kitchen. antiseptic oral rinse  15 mL Mouth Rinse QID  . calamine   Topical TID  . chlorhexidine  15 mL Mouth Rinse BID  . free water  250 mL Per Tube Q4H  .  lactulose  45 g Oral Q6H  . pantoprazole sodium  40 mg Per Tube Daily  . rifaximin  550 mg Per Tube BID    BMET    Component Value Date/Time   NA 159* 06/01/2014 0500   NA 138 04/19/2013 1109   K 3.1* 06/01/2014 0500   K 3.0* 04/19/2013 1109   CL 126* 06/01/2014 0500   CL 99 04/19/2013 1109   CO2 18* 06/01/2014 0500   CO2 27 04/19/2013 1109   GLUCOSE 160* 06/01/2014 0500   GLUCOSE 115* 04/19/2013 1109   BUN 27* 06/01/2014 0500   BUN 11.0 04/19/2013 1109   CREATININE 2.96* 06/01/2014 0500   CREATININE 0.8 04/19/2013 1109   CALCIUM 7.8* 06/01/2014 0500   CALCIUM 7.5* 04/19/2013 1109   GFRNONAA 18* 06/01/2014 0500   GFRAA 20* 06/01/2014 0500   CBC    Component Value Date/Time   WBC 4.3 06/01/2014 0500   WBC 2.9* 04/19/2013 1109   RBC 2.42* 06/01/2014 0500   RBC 3.77 04/19/2013 1109   HGB 7.4* 06/01/2014 0500   HGB 12.3 04/19/2013 1109   HCT 23.8* 06/01/2014 0500   HCT 36.0 04/19/2013 1109   PLT 31* 06/01/2014 0500   PLT 39* 04/19/2013 1109   MCV 98.3 06/01/2014 0500   MCV 95.4 04/19/2013 1109   MCH 30.6 06/01/2014 0500   MCH 32.6 04/19/2013 1109   MCHC 31.1 06/01/2014 0500   MCHC 34.2 04/19/2013 1109   RDW 22.3* 06/01/2014 0500   RDW 15.8* 04/19/2013 1109   LYMPHSABS 1.2 05/30/2014 0500   LYMPHSABS 0.5*  04/19/2013 1109   MONOABS 0.4 05/30/2014 0500   MONOABS 0.2 04/19/2013 1109   EOSABS 0.3 05/30/2014 0500   EOSABS 0.1 04/19/2013 1109   BASOSABS 0.1 05/30/2014 0500   BASOSABS 0.0 04/19/2013 1109     Assessment/Plan:  1- AKI- Likely due to hepatorenal syndrome. Pt appears better today. Mild improvement in Cr- 2.96 today, but pt still appears dehydrated, started on 250mls Q4H yesterday. Consider Increasing to 300mls Q6H.  2. Hypernatremia- 159 today, Calculated free water deficit- 7.1L.  3. Hepatic encephalopathy on lactulose- 25 05/31/2014, down from 177 05/22/2014. Improving clinically.  4. ABLA- Esophageal Varices.  5. Cirrhosis- ?NASH. Will need transplant refferal.  6. 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 Avoid anticoagulants.   Emokpae, Ejiroghene IMTS PGY- 2  I have seen and examined this patient and agree with plan as outlined by Dr. Mariea ClontsEmokpae.  [name] serum sodium is still elevated.  Will increase free water bolus and change IVF's to D5W as her free water deficit is 7L.  Renal function has improved some, but ascites is still large. Joah Patlan A,MD 06/01/2014 8:47 PM

## 2014-06-01 NOTE — Progress Notes (Addendum)
Daily Rounding Note  06/01/2014, 8:31 AM  LOS: 10 days   SUBJECTIVE:       Loose brown stools. 1900 cc 05/31/14 Started tube feeding 7/1. Glucerna at 60 ml/hour. Urine output 425 cc 7/1.  + 2.7 liters 7/1 More alert today.    OBJECTIVE:         Vital signs in last 24 hours:    Temp:  [98.1 F (36.7 C)-98.8 F (37.1 C)] 98.8 F (37.1 C) (07/02 0750) Pulse Rate:  [86-91] 88 (07/02 0750) Resp:  [12-16] 15 (07/02 0750) BP: (119-136)/(61-72) 131/68 mmHg (07/02 0750) SpO2:  [100 %] 100 % (07/02 0750) Weight:  [87 kg (191 lb 12.8 oz)] 87 kg (191 lb 12.8 oz) (07/02 0335) Last BM Date: 05/31/14 General: looks better, still not speaking   Heart: RRR Chest: clear bil.  Unlabored breathing Abdomen: large, more protuberant and tense, not tender, hypoactive BS .  Brown watery stool in flexiseal.  Extremities: + LE edema.  Neuro/Psych: alert.  Follows commands.  Not speaking.  Eyes wide open.    Intake/Output this shift: Total I/O In: -  Out: 45 [Urine:45]  Lab Results:  Recent Labs  05/30/14 0500 06/01/14 0500  WBC 5.4 4.3  HGB 7.7* 7.4*  HCT 24.8* 23.8*  PLT 42* 31*   BMET  Recent Labs  05/30/14 2200 05/31/14 1140 06/01/14 0500  NA 158* 158* 159*  K 3.1* 3.6* 3.1*  CL 123* 125* 126*  CO2 18* 17* 18*  GLUCOSE 149* 144* 160*  BUN 31* 29* 27*  CREATININE 3.43* 3.33* 2.96*  CALCIUM 8.9 8.5 7.8*   LFT  Recent Labs  05/31/14 1140 06/01/14 0500  PROT 6.6 6.1  ALBUMIN 3.3* 3.0*  AST 48* 64*  ALT 16 19  ALKPHOS 162* 214*  BILITOT 1.7* 1.5*   PT/INR  Recent Labs  05/31/14 0500  LABPROT 26.5*  INR 2.44*    Scheduled Meds: . antiseptic oral rinse  15 mL Mouth Rinse QID  . calamine   Topical TID  . chlorhexidine  15 mL Mouth Rinse BID  . free water  250 mL Per Tube Q4H  . lactulose  45 g Oral Q6H  . pantoprazole sodium  40 mg Per Tube Q12H  . rifaximin  550 mg Per Tube BID   Continuous  Infusions: . dextrose 5 % and 0.45% NaCl 125 mL/hr at 06/01/14 0021  . feeding supplement (GLUCERNA 1.2 CAL) 1,000 mL (06/01/14 0445)   PRN Meds:.ondansetron (ZOFRAN) IV   ASSESMENT:   * Advanced, decompensated cirrhosis. Jaundice improving.  * Variceal bleed. EGD with banding x 7 6/22 (Dr Elnoria HowardHung). On BID Protonix via tube. No BB on board  * Ascites, anasarca. Diuretics on hold. Diagnostic paracentesis 6/23: no SBP.  On exam, ascites seems progressive. Fearful of large volume paracentesis in pt with ARF, also pt not displaying abdominal tenderness.  * ARF. IV albumin given 6/28. possible pre-renal or HRS. Dr Abel Prestoolodonato favors former etiology. Improved  * Thrombocytopenia. Worse.  * Encephalopathy. On Rifaximin, Chronulac and having abundant liquid stools. . Max ammonia 177 on 6/22. Glasgow coma scale 12 7/1 * Coagulopathy secondary to liver dysfunction. Worsening.  * Anemia. Stable.  * Hypernatremia.  Began scheduled free water boluses 7/1: Na slightly higher today. No indication of volume overload.  * Hypokalemia. Worse.  * DM2.  * Dysphagia. Began tube feeds yesterday.  *  Mild LVH, 60% EF on 05/31/14 echo.  PLAN   *  Going to drop Protonix to once daily.  Continue TF til mental status allows for safe po intake.  *  Follow CBC, BMET, coags and urine output.     Jennye MoccasinSarah Gribbin  06/01/2014, 8:31 AM Pager: [phone]  GI ATTENDING  Interval history data reviewed. Patient seen and examined. Agree with H&P as outlined above. Remains critically ill with poor prognosis but clinically stable. Still markedly hypernatremic. Continue free water via tube. Tube feeds initiated. No additional recommendations.  Wilhemina BonitoJohn N. Eda KeysPerry, Jr., M.D. [facility] Division of Gastroenterology

## 2014-06-01 NOTE — Progress Notes (Signed)
Physical Therapy Treatment Patient Details Name: Dana MatterMelissa B Bale MRN: [id number] DOB: September 10, 1966 Today's Date: 06/01/2014    History of Present Illness Pt admit with hepatic encephalopathy with ESLD.  VDRF as well as anemia.      PT Comments    Pt showing slow progress towards physical therapy goals and required increased assist for transfers today. Pt was overall agreeable to OOB and was smiling when therapist exited room. Will continue to follow.  Follow Up Recommendations  SNF;Supervision/Assistance - 24 hour     Equipment Recommendations  Other (comment) (possibly shower equipment)    Recommendations for Other Services       Precautions / Restrictions Precautions Precautions: Fall Restrictions Weight Bearing Restrictions: No    Mobility  Bed Mobility Overal bed mobility: Needs Assistance;+2 for physical assistance Bed Mobility: Supine to Sit     Supine to sit: Max assist;+2 for physical assistance     General bed mobility comments: Hand-over-hand assist to reach for bed rails. Able to move LE's towards EOB with tactile cueing. Max assist +2 for trunk elevation and heavy assist to maintain sitting balance EOB.   Transfers Overall transfer level: Needs assistance Equipment used: 2 person hand held assist Transfers: Sit to/from [employer]GI CorporationStand;Stand Pivot Transfers Sit to Stand: Max assist;+2 physical assistance Stand pivot transfers: Max assist;+2 physical assistance       General transfer comment: Bed pad used for extra support under hips. Pt required max assist +2 to transition from bed to chair. Minimal pivoting/advancing of feet during transfer.   Ambulation/Gait                 Stairs            Wheelchair Mobility    Modified Rankin (Stroke Patients Only)       Balance Overall balance assessment: Needs assistance Sitting-balance support: Feet supported;Bilateral upper extremity supported Sitting balance-Leahy Scale: Zero Sitting balance -  Comments: Needed mod to max assist to sit at EOB.  Leans posteriorly.   Postural control: Posterior lean Standing balance support: Bilateral upper extremity supported Standing balance-Leahy Scale: Zero                      Cognition Arousal/Alertness: Awake/alert Behavior During Therapy: Flat affect Overall Cognitive Status: Within Functional Limits for tasks assessed                      Exercises      General Comments        Pertinent Vitals/Pain Vitals stable throughout session.     Home Living                      Prior Function            PT Goals (current goals can now be found in the care plan section) Acute Rehab PT Goals Patient Stated Goal: None stated PT Goal Formulation: With patient/family Time For Goal Achievement: 06/12/14 Potential to Achieve Goals: Fair Progress towards PT goals: Not progressing toward goals - comment    Frequency  Min 3X/week    PT Plan Current plan remains appropriate    Co-evaluation             End of Session Equipment Utilized During Treatment: Gait belt Activity Tolerance: Patient limited by fatigue;Patient limited by lethargy Patient left: in chair;with call bell/phone within reach;with family/visitor present     Time: 1400-1433 PT Time Calculation (min): 33 min  Charges:  $Therapeutic Activity: 23-37 mins                    G Codes:      Ruthann CancerHamilton, Milania Haubner 06/01/2014, 4:06 PM  Ruthann CancerLaura [name], PT, DPT Acute Rehabilitation Services Pager: [phone]

## 2014-06-01 NOTE — Progress Notes (Signed)
Chaplain received called from [place] that pt daughter wanted to speak with a chaplain. Chaplain met librarian and daughter in the [place]. Pt daughter is feeling overwhelmed and guilty. She feels that she could have done more to help her mother get better. She also feels that her mother gave up and did not want to fight. Pt daughter requested any reading material that could help her cope with her mother's sickness and possible death. Chaplain gave her a Bible and several pamphlets dealing with death and dying. Chaplain provided a listening ear, emotional support and prayer. Chaplain will refer this family to the unit chaplin for further follow up.   Charyl Dancer, Chaplain

## 2014-06-02 LAB — GLUCOSE, CAPILLARY
GLUCOSE-CAPILLARY: 135 mg/dL — AB (ref 70–99)
Glucose-Capillary: 123 mg/dL — ABNORMAL HIGH (ref 70–99)
Glucose-Capillary: 127 mg/dL — ABNORMAL HIGH (ref 70–99)
Glucose-Capillary: 138 mg/dL — ABNORMAL HIGH (ref 70–99)
Glucose-Capillary: 138 mg/dL — ABNORMAL HIGH (ref 70–99)
Glucose-Capillary: 151 mg/dL — ABNORMAL HIGH (ref 70–99)

## 2014-06-02 LAB — COMPREHENSIVE METABOLIC PANEL
ALT: 23 U/L (ref 0–35)
ANION GAP: 13 (ref 5–15)
AST: 84 U/L — ABNORMAL HIGH (ref 0–37)
Albumin: 3 g/dL — ABNORMAL LOW (ref 3.5–5.2)
Alkaline Phosphatase: 297 U/L — ABNORMAL HIGH (ref 39–117)
BUN: 30 mg/dL — AB (ref 6–23)
CALCIUM: 8.2 mg/dL — AB (ref 8.4–10.5)
CO2: 19 meq/L (ref 19–32)
CREATININE: 2.92 mg/dL — AB (ref 0.50–1.10)
Chloride: 123 mEq/L — ABNORMAL HIGH (ref 96–112)
GFR, EST AFRICAN AMERICAN: 21 mL/min — AB (ref >90)
GFR, EST NON AFRICAN AMERICAN: 18 mL/min — AB (ref >90)
GLUCOSE: 159 mg/dL — AB (ref 70–99)
Potassium: 3.5 mEq/L — ABNORMAL LOW (ref 3.7–5.3)
Sodium: 155 mEq/L — ABNORMAL HIGH (ref 137–147)
TOTAL PROTEIN: 6.5 g/dL (ref 6.0–8.3)
Total Bilirubin: 1.5 mg/dL — ABNORMAL HIGH (ref 0.3–1.2)

## 2014-06-02 MED ORDER — PANTOPRAZOLE SODIUM 40 MG PO PACK
40.0000 mg | PACK | Freq: Two times a day (BID) | ORAL | Status: DC
  Administered 2014-06-02 – 2014-06-04 (×5): 40 mg
  Filled 2014-06-02 (×9): qty 20

## 2014-06-02 MED ORDER — FREE WATER
400.0000 mL | Status: DC
  Administered 2014-06-03 – 2014-06-05 (×14): 400 mL

## 2014-06-02 MED ORDER — FUROSEMIDE 10 MG/ML IJ SOLN
80.0000 mg | Freq: Once | INTRAMUSCULAR | Status: AC
  Administered 2014-06-02: 80 mg via INTRAVENOUS
  Filled 2014-06-02: qty 8

## 2014-06-02 MED ORDER — FLUOXETINE HCL 20 MG/5ML PO SOLN
20.0000 mg | Freq: Every day | ORAL | Status: DC
  Administered 2014-06-02 – 2014-06-04 (×3): 20 mg
  Filled 2014-06-02 (×4): qty 5

## 2014-06-02 NOTE — Progress Notes (Signed)
Daily Rounding Note  06/02/2014, 8:33 AM  LOS: 11 days   SUBJECTIVE:       Weight increase from 87 to 90.6 kg in last 24 hours.  Urine output just 195 cc yesterday.  Stool output, watery/brown 950 cc yesterday.   OBJECTIVE:         Vital signs in last 24 hours:    Temp:  [98 F (36.7 C)-99.4 F (37.4 C)] 98 F (36.7 C) (07/03 0739) Pulse Rate:  [84-94] 89 (07/03 0739) Resp:  [13-18] 18 (07/03 0739) BP: (113-133)/(44-77) 126/56 mmHg (07/03 0739) SpO2:  [100 %] 100 % (07/03 0400) Weight:  [90.6 kg (199 lb 11.8 oz)] 90.6 kg (199 lb 11.8 oz) (07/03 0350) Last BM Date: 06/01/14 (flexiseal) General: less engaged though eyes open   Heart: RRR Chest: clear bil in front.  No labored breathing Abdomen: distended, tight, not tender.  Beginnings of umbilical hernia. BS present .  Stool in pouch is watery/brown.  Urine is straw yellow, not concentrated Extremities: anasarca again into thighs.  Purpura on arms Neuro/Psych:  Not agitated, today not following commands.  Not able to elicit asterixis.   Intake/Output from previous day: 07/02 0701 - 07/03 0700 In: 4268.8 [I.V.:1728.8; NG/GT:2540] Out: 1145 [Urine:195; Stool:950]  Intake/Output this shift: Total I/O In: -  Out: 225 [Urine:225]  Lab Results:  Recent Labs  06/01/14 0500  WBC 4.3  HGB 7.4*  HCT 23.8*  PLT 31*   BMET  Recent Labs  05/31/14 1140 06/01/14 0500 06/02/14 0225  NA 158* 159* 155*  K 3.6* 3.1* 3.5*  CL 125* 126* 123*  CO2 17* 18* 19  GLUCOSE 144* 160* 159*  BUN 29* 27* 30*  CREATININE 3.33* 2.96* 2.92*  CALCIUM 8.5 7.8* 8.2*   LFT  Recent Labs  05/31/14 1140 06/01/14 0500 06/02/14 0225  PROT 6.6 6.1 6.5  ALBUMIN 3.3* 3.0* 3.0*  AST 48* 64* 84*  ALT 16 19 23   ALKPHOS 162* 214* 297*  BILITOT 1.7* 1.5* 1.5*   PT/INR  Recent Labs  05/31/14 0500  LABPROT 26.5*  INR 2.44*     ASSESMENT:   * Advanced, decompensated  cirrhosis. Jaundice improving, Alk phos, AST rising. Clinically looks worse today.  * Variceal bleed. EGD with banding x 7 6/22 (Dr Benson Norway). On BID Protonix via tube. No BB on board  * Ascites, anasarca. Diuretics on hold. Paracentesis 6/23: no SBP.  On exam, ascites seems progressive.  Significant weight increases in last 4 days. Fearful of large volume paracentesis in pt with ARF, also pt not displaying abdominal tenderness.  * ARF. IV albumin given 6/28. possible pre-renal or HRS. Improving creatinine, BUN slightly worse. Oliguria is ominous sign.   * Thrombocytopenia. Steadily worse.  * Encephalopathy. On Rifaximin, Chronulac and having abundant liquid stools. Worse today. Max ammonia 177 on 6/22.  * Coagulopathy secondary to liver dysfunction. Worsening. No labs today.  * Anemia. Stable.  * Hypernatremia. Began scheduled free water boluses 7/1: Na slightly improved.  Now on IV D 5, free water 300 ml q 4 hours.  No indication of volume overload.  * Hypokalemia. improved. Getting supplemented via tube.  * DM2.  * Dysphagia. Began tube feeds 7/1.  * Mild LVH, 60% EF on 05/31/14 echo.      PLAN   *  Coags, CMET, CBC in AM     Azucena Freed  06/02/2014, 8:33 AM Pager: [phone]  GI ATTENDING  Interval history  and data reviewed. Patient seen and examined. Husband in room. Clinically stable. A bit more alert this afternoon. Follows commands. Does not communicate. Marked ascites. Sodium and renal function slightly better. Overall clinically stable with poor prognosis.  Docia Chuck. Geri Seminole., M.D. [facility] Division of Gastroenterology

## 2014-06-02 NOTE — Progress Notes (Signed)
[place] TEAM 1 - Stepdown/ICU TEAM Progress Note  TENIOLA TSENG ZOX:[id number] DOB: 03-03-1966 DOA: 05/22/2014 PCP: [name], MD  Admit HPI / Brief Narrative: 48 WF PMHx depression, seizure, HTN, cirrhosis of unclear etiology although does have prior history of polysubstance abuse including alcohol. Presented to [facility] ED 6/22 AM with hematemesis and decreased LOC. Recently discharged from [facility] after hospitalization for hepatic encephalopathy. Due to inability to protect airway she was intubated. GI was consulted and she was also placed on Octerotide infusion. EGD revealed large esophageal varices (requiring 7 bands) and portal gastropathy. She also underwent large volume paracentesis with 10,000 cc removed. She was eventually extubated and after treatment for encephalopathy she was able to awaken somewhat by 6/28. She was subsequently made a DNR this admission.  HPI/Subjective: 7/3 Alert and awake asks/answers she simple questions, follows all commands    Assessment/Plan: Cryptogenic cirrhosis:  A) Ascites  B) Coagulopathy  C) Thrombocytopenia  D) Esophageal varices and portal gastropathy  -more alert 7/2  -initial dx in 2009 and felt to be 2/2 ETOH but family denies pt with prior alcoholism so ?? NASH vs auto immune??  -MELD score 31 which equals a 52% mortality and Child-Pugh score is 14 which equals a Class C  -currently no signs of bleeding  -GI has documented if can get pt stable that should be considered for transplant list so will obtain ECHO esp in setting of acute renal failure  -due to GIB GI recommend cont empiric Rocephin for SBP prophylaxis -Counseled son and daughter poor prognosis patient getting close to discharge   Hepatic encephalopathy  -receiving Rifaximin and Lactulose per tube-decrease Lactulose to 30 gm BID since alert ammonia normal on current tx  -mentation improved  -unable to safely take PO's so PANDA tube placed  -7/3 prior to discharge will need  to discuss placement of PEG tube (is it possible considering her ascites?)  Upper GI bleed due to esophageal varices  -Protonix 40 mg BID  per NG tube  -GI recommends repeat EGD around July 22  -Possible place PEG tube?  Acute respiratory failure with hypoxemia  -primarily intubated for airway protection  -CXR 6/29 unremarkable for PNA   History SEIZURE, GRAND MAL  -was due to Alprazolam withdrawal   HYPERTENSION  -controlled off meds   Acute blood loss anemia  -admission hgb was 12 and now hanging around 7.7  -BP stable so defer transfusion at this time   Acute renal failure/hypernatremia  -Renal following and suspects pre renal etiology and not hepatorenal syndrome  -Increase free water to 400 ml q 4hr , continue D5W 75 ml/hr  DEPRESSIVE DISORDER, RCR, MODERATE  -Start Prozac 20 mg daily  GERD   Substance abuse/Nicotine abuse  -unclear if pt utilized regular alcohol either surreptitiously or culturally family does not identify the degree to which she drank as problematic or high ie they denied she was "alcoholic"   Sore throat  -Continue Cetacaine spray TID PRN    Code Status: DO NOT RESUSCITATE  Family Communication: Son and daughter at bedside  Disposition Plan/Expected LOS: Need to have goals of care discussion with family; home health vs palliative care vs hospice     Consultants: Dr. Yancey Flemings (gastroenterology) Dr. Terrial Rhodes (nephrology)   Procedure/Significant Events: ETT6/22 >> 6/25, 6/25 >> 6/27  L IJ CVL 6/22 >>  7/1 echocardiogram - Left ventricle: mild LVH. LVEF= 60%.  - Left atrium: mildly dilated.    Culture UC 6/22 >> negative  Resp 6/22 >> mod staph >> MRSA  Blood 6/22 >> canceled / not drawn  Peritoneal Fluid 6/22 >> rare WBC >> negative   Antibiotics: Ceftriaxone (SBP px) 6/22 >> (plan d/c 6/28   DVT prophylaxis: SCD   Devices N/A   LINES / TUBES:  6/22 triple-lumen LIJ CVC    Continuous Infusions: .  dextrose 75 mL/hr at 06/01/14 1517  . feeding supplement (GLUCERNA 1.2 CAL) 1,000 mL (06/02/14 1212)    Objective: VITAL SIGNS: Temp: 98.2 F (36.8 C) (07/03 1629) Temp src: Oral (07/03 1629) BP: 136/69 mmHg (07/03 1629) Pulse Rate: 89 (07/03 1629) SPO2; 100% on room FIO2:   Intake/Output Summary (Last 24 hours) at 06/02/14 1935 Last data filed at 06/02/14 1800  Gross per 24 hour  Intake   4905 ml  Output   1525 ml  Net   3380 ml     Exam: General: Alert and smiles with appropriate eye contact, asks and answers simple questions, follows all commands (-) Jaundice  Lungs: Clear to auscultation bilaterally without wheezes or crackles, RA  Cardiovascular: Regular rate and rhythm without murmur gallop or rub normal S1 and S2, no peripheral edema or JVD  Abdomen: Nontender, distended with ascites, soft, bowel sounds positive, no rebound,no appreciable mass-Panda tube with continuous feedings infusing  Musculoskeletal: No significant cyanosis, clubbing of bilateral lower extremities, bilateral lower extremity 3+ pedal edema to hips   Data Reviewed: Basic Metabolic Panel:  Recent Labs Lab 05/28/14 0359  05/30/14 1010 05/30/14 2200 05/31/14 1140 06/01/14 0500 06/02/14 0225  NA 152*  < > 161* 158* 158* 159* 155*  K 3.8  < > 3.3* 3.1* 3.6* 3.1* 3.5*  CL 116*  < > 126* 123* 125* 126* 123*  CO2 17*  < > 19 18* 17* 18* 19  GLUCOSE 144*  < > 132* 149* 144* 160* 159*  BUN 35*  < > 32* 31* 29* 27* 30*  CREATININE 3.61*  < > 3.61* 3.43* 3.33* 2.96* 2.92*  CALCIUM 8.4  < > 8.8 8.9 8.5 7.8* 8.2*  MG 1.5  --   --   --  1.7  --   --   < > = values in this interval not displayed. Liver Function Tests:  Recent Labs Lab 05/31/14 1140 06/01/14 0500 06/02/14 0225  AST 48* 64* 84*  ALT 16 19 23   ALKPHOS 162* 214* 297*  BILITOT 1.7* 1.5* 1.5*  PROT 6.6 6.1 6.5  ALBUMIN 3.3* 3.0* 3.0*   No results found for this basename: LIPASE, AMYLASE,  in the last 168 hours  Recent  Labs Lab 05/27/14 1800 05/31/14 1140  AMMONIA 26 25   CBC:  Recent Labs Lab 05/28/14 0359 05/29/14 0504 05/30/14 0500 06/01/14 0500  WBC 5.1 3.1* 5.4 4.3  NEUTROABS  --   --  3.4  --   HGB 8.2* 7.0* 7.7* 7.4*  HCT 25.8* 21.8* 24.8* 23.8*  MCV 97.0 98.2 100.0 98.3  PLT 39* 28* 42* 31*   Cardiac Enzymes: No results found for this basename: CKTOTAL, CKMB, CKMBINDEX, TROPONINI,  in the last 168 hours BNP (last 3 results) No results found for this basename: PROBNP,  in the last 8760 hours CBG:  Recent Labs Lab 06/02/14 0013 06/02/14 0425 06/02/14 0737 06/02/14 1320 06/02/14 1629  GLUCAP 127* 138* 135* 138* 151*    Recent Results (from the past 240 hour(s))  CULTURE, RESPIRATORY (NON-EXPECTORATED)     Status: None   Collection Time    05/23/14  9:08  PM      Result Value Ref Range Status   Specimen Description TRACHEAL ASPIRATE   Final   Special Requests Normal   Final   Gram Stain     Final   Value: FEW WBC PRESENT,BOTH PMN AND MONONUCLEAR     RARE SQUAMOUS EPITHELIAL CELLS PRESENT     FEW GRAM POSITIVE COCCI IN PAIRS     IN CLUSTERS     Performed at [facility]   Culture     Final   Value: MODERATE METHICILLIN RESISTANT STAPHYLOCOCCUS AUREUS     Note: RIFAMPIN AND GENTAMICIN SHOULD NOT BE USED AS SINGLE DRUGS FOR TREATMENT OF STAPH INFECTIONS. CRITICAL RESULT CALLED TO, READ BACK BY AND VERIFIED WITH: CAMISHA@7 :53AM ON 05/26/14 BY DANTS     Performed at [facility]   Report Status 05/26/2014 FINAL   Final   Organism ID, Bacteria METHICILLIN RESISTANT STAPHYLOCOCCUS AUREUS   Final     Studies:  Recent x-ray studies have been reviewed in detail by the Attending Physician  Scheduled Meds:  Scheduled Meds: . antiseptic oral rinse  15 mL Mouth Rinse QID  . calamine   Topical TID  . chlorhexidine  15 mL Mouth Rinse BID  . free water  300 mL Per Tube Q4H  . insulin aspart  0-15 Units Subcutaneous TID WC  . insulin aspart  0-5 Units  Subcutaneous QHS  . lactulose  30 g Per Tube BID  . pantoprazole sodium  40 mg Per Tube Daily  . potassium chloride  20 mEq Per Tube BID  . rifaximin  550 mg Per Tube BID    Time spent on care of this patient: 40 mins   Drema DallasWOODS, Shayon Trompeter, J , MD   [facility] Office  [phone] Pager [phone]  On-Call/Text Page:      [email or web address]      password [email or web address]  If 7PM-7AM, please contact night-coverage [email or web address] Password [email or web address] 06/02/2014, 7:35 PM   LOS: 11 days

## 2014-06-02 NOTE — Progress Notes (Signed)
S: No complaints today. Daughtrer at bedside.  O:BP 126/56  Pulse 89  Temp(Src) 98 F (36.7 C) (Oral)  Resp 18  Ht 5\' 4"  (1.626 m)  Wt 199 lb 11.8 oz (90.6 kg)  BMI 34.27 kg/m2  SpO2 100%  LMP 05/25/2014  Intake/Output Summary (Last 24 hours) at 06/02/14 0753 Last data filed at 06/02/14 0739  Gross per 24 hour  Intake 4268.75 ml  Output   1325 ml  Net 2943.75 ml   Intake/Output: I/O last 3 completed shifts: In: 7016.3 [I.V.:3103.8; NG/GT:3912.5] Out: 2320 [Urine:370; Stool:1950]  Intake/Output this shift:  Total I/O In: -  Out: 225 [Urine:225] Weight change: 7 lb 15 oz (3.6 kg)  Physical exam-  Gen: Not in any distress, not responding to commands today.  HEENT- AT, [place], Eyes open, exam, limited. CVS: Regular rate, no added sounds  Resp: Clear to auscultation bilat  Abd: Distended, not tender to palpation, bowel sounds not heard, likely due to distension, fluid thrill present.  Ext: Warm and well perfused, pitting pedal edema.  Neuro- Limited exam, by pts mental status.   Recent Labs Lab 05/26/14 0930  05/29/14 1507 05/29/14 2210 05/30/14 1010 05/30/14 2200 05/31/14 1140 06/01/14 0500 06/02/14 0225  NA  --   < > 157* 159* 161* 158* 158* 159* 155*  K  --   < > 3.7 3.5* 3.3* 3.1* 3.6* 3.1* 3.5*  CL  --   < > 123* 125* 126* 123* 125* 126* 123*  CO2  --   < > 16* 18* 19 18* 17* 18* 19  GLUCOSE  --   < > 125* 127* 132* 149* 144* 160* 159*  BUN  --   < > 33* 33* 32* 31* 29* 27* 30*  CREATININE  --   < > 3.63* 3.60* 3.61* 3.43* 3.33* 2.96* 2.92*  ALBUMIN 2.7*  --   --   --   --   --  3.3* 3.0* 3.0*  CALCIUM  --   < > 9.0 8.9 8.8 8.9 8.5 7.8* 8.2*  AST 43*  --   --   --   --   --  48* 64* 84*  ALT 17  --   --   --   --   --  16 19 23   < > = values in this interval not displayed. Liver Function Tests:  Recent Labs Lab 05/31/14 1140 06/01/14 0500 06/02/14 0225  AST 48* 64* 84*  ALT 16 19 23   ALKPHOS 162* 214* 297*  BILITOT 1.7* 1.5* 1.5*  PROT 6.6 6.1 6.5   ALBUMIN 3.3* 3.0* 3.0*   No results found for this basename: LIPASE, AMYLASE,  in the last 168 hours  Recent Labs Lab 05/27/14 1800 05/31/14 1140  AMMONIA 26 25   CBC:  Recent Labs Lab 05/28/14 0359 05/29/14 0504 05/30/14 0500 06/01/14 0500  WBC 5.1 3.1* 5.4 4.3  NEUTROABS  --   --  3.4  --   HGB 8.2* 7.0* 7.7* 7.4*  HCT 25.8* 21.8* 24.8* 23.8*  MCV 97.0 98.2 100.0 98.3  PLT 39* 28* 42* 31*   Cardiac Enzymes: No results found for this basename: CKTOTAL, CKMB, CKMBINDEX, TROPONINI,  in the last 168 hours CBG:  Recent Labs Lab 06/01/14 1644 06/01/14 1946 06/01/14 2155 06/02/14 0013 06/02/14 0425  GLUCAP 119* 140* 141* 127* 138*    Iron Studies: No results found for this basename: IRON, TIBC, TRANSFERRIN, FERRITIN,  in the last 72 hours Studies/Results: No results found. Marland Kitchen antiseptic  oral rinse  15 mL Mouth Rinse QID  . calamine   Topical TID  . chlorhexidine  15 mL Mouth Rinse BID  . free water  300 mL Per Tube Q4H  . insulin aspart  0-15 Units Subcutaneous TID WC  . insulin aspart  0-5 Units Subcutaneous QHS  . lactulose  30 g Per Tube BID  . pantoprazole sodium  40 mg Per Tube Daily  . potassium chloride  20 mEq Per Tube BID  . rifaximin  550 mg Per Tube BID    BMET    Component Value Date/Time   NA 155* 06/02/2014 0225   NA 138 04/19/2013 1109   K 3.5* 06/02/2014 0225   K 3.0* 04/19/2013 1109   CL 123* 06/02/2014 0225   CL 99 04/19/2013 1109   CO2 19 06/02/2014 0225   CO2 27 04/19/2013 1109   GLUCOSE 159* 06/02/2014 0225   GLUCOSE 115* 04/19/2013 1109   BUN 30* 06/02/2014 0225   BUN 11.0 04/19/2013 1109   CREATININE 2.92* 06/02/2014 0225   CREATININE 0.8 04/19/2013 1109   CALCIUM 8.2* 06/02/2014 0225   CALCIUM 7.5* 04/19/2013 1109   GFRNONAA 18* 06/02/2014 0225   GFRAA 21* 06/02/2014 0225   CBC    Component Value Date/Time   WBC 4.3 06/01/2014 0500   WBC 2.9* 04/19/2013 1109   RBC 2.42* 06/01/2014 0500   RBC 3.77 04/19/2013 1109   HGB 7.4* 06/01/2014 0500   HGB 12.3  04/19/2013 1109   HCT 23.8* 06/01/2014 0500   HCT 36.0 04/19/2013 1109   PLT 31* 06/01/2014 0500   PLT 39* 04/19/2013 1109   MCV 98.3 06/01/2014 0500   MCV 95.4 04/19/2013 1109   MCH 30.6 06/01/2014 0500   MCH 32.6 04/19/2013 1109   MCHC 31.1 06/01/2014 0500   MCHC 34.2 04/19/2013 1109   RDW 22.3* 06/01/2014 0500   RDW 15.8* 04/19/2013 1109   LYMPHSABS 1.2 05/30/2014 0500   LYMPHSABS 0.5* 04/19/2013 1109   MONOABS 0.4 05/30/2014 0500   MONOABS 0.2 04/19/2013 1109   EOSABS 0.3 05/30/2014 0500   EOSABS 0.1 04/19/2013 1109   BASOSABS 0.1 05/30/2014 0500   BASOSABS 0.0 04/19/2013 1109     Assessment/Plan:  1- AKI- Likely due to hepatorenal syndrome.  Cr about the same today 2.92, from 2.96 yesterday. Good urine output with hydration, but net positive yesterday- 3.12L.  2. Hypernatremia- 155 today, Calculated free water deficit- 5.8 L. Currently on 300mls Q4H +75cc/hr D5W, gotten 3600mls of fluids. Consider reducing IVF to 1650mls/hr, and free water to 230mls Q4H, to get a total of ~2.6L daily.  3. Hepatic encephalopathy on lactulose-Ammonia 25 05/31/2014, down from 177 05/22/2014. Improving clinically.   4. ABLA- Esophageal Varices. On PPI. Per GI  5. Cirrhosis- ?NASH. Will need transplant refferal.   6. 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 With ecchymosis. Avoid anticoagulants.   7. Hypokalemia- 3.5 today on daily suppliments- 20meq of kcl BID.   Emokpae, Ejiroghene  IMTS  PGY- 2  I have seen and examined this patient and agree with plan as outlined by Dr. Mariea ClontsEmokpae.  Mrs. Alessandra BevelsVaughn is continuing to declinie clinically despite improving lab values.  Her Scr is improving but this is mainly dilutional as she is oliguric and her weight continues to increase.  She likely is in hepatorenal syndrome and if she is not a candidate for liver transplant, dialysis would not impact her overall survival.  Would ask GI and/or primary svc to [facility]  [facility] given worsening anasarca/ascites and  oliguric AKI.  Will try a dose of IV lasix to see if this will help with her volume. Dynasia Kercheval A,MD 06/02/2014 11:57 AM

## 2014-06-03 DIAGNOSIS — F331 Major depressive disorder, recurrent, moderate: Secondary | ICD-10-CM

## 2014-06-03 DIAGNOSIS — F411 Generalized anxiety disorder: Secondary | ICD-10-CM

## 2014-06-03 LAB — CBC
HCT: 24 % — ABNORMAL LOW (ref 36.0–46.0)
Hemoglobin: 7.6 g/dL — ABNORMAL LOW (ref 12.0–15.0)
MCH: 31.3 pg (ref 26.0–34.0)
MCHC: 31.7 g/dL (ref 30.0–36.0)
MCV: 98.8 fL (ref 78.0–100.0)
PLATELETS: 25 10*3/uL — AB (ref 150–400)
RBC: 2.43 MIL/uL — ABNORMAL LOW (ref 3.87–5.11)
RDW: 22.5 % — ABNORMAL HIGH (ref 11.5–15.5)
WBC: 6.4 10*3/uL (ref 4.0–10.5)

## 2014-06-03 LAB — GLUCOSE, CAPILLARY
Glucose-Capillary: 139 mg/dL — ABNORMAL HIGH (ref 70–99)
Glucose-Capillary: 159 mg/dL — ABNORMAL HIGH (ref 70–99)
Glucose-Capillary: 143 mg/dL — ABNORMAL HIGH (ref 70–99)
Glucose-Capillary: 129 mg/dL — ABNORMAL HIGH (ref 70–99)

## 2014-06-03 LAB — COMPREHENSIVE METABOLIC PANEL
ALT: 27 U/L (ref 0–35)
ANION GAP: 15 (ref 5–15)
AST: 99 U/L — ABNORMAL HIGH (ref 0–37)
Albumin: 3 g/dL — ABNORMAL LOW (ref 3.5–5.2)
Alkaline Phosphatase: 374 U/L — ABNORMAL HIGH (ref 39–117)
BUN: 36 mg/dL — AB (ref 6–23)
CALCIUM: 8.1 mg/dL — AB (ref 8.4–10.5)
CO2: 18 mEq/L — ABNORMAL LOW (ref 19–32)
Chloride: 121 mEq/L — ABNORMAL HIGH (ref 96–112)
Creatinine, Ser: 2.79 mg/dL — ABNORMAL HIGH (ref 0.50–1.10)
GFR calc Af Amer: 22 mL/min — ABNORMAL LOW (ref >90)
GFR calc non Af Amer: 19 mL/min — ABNORMAL LOW (ref >90)
GLUCOSE: 157 mg/dL — AB (ref 70–99)
Potassium: 3.7 mEq/L (ref 3.7–5.3)
SODIUM: 154 meq/L — AB (ref 137–147)
TOTAL PROTEIN: 6.5 g/dL (ref 6.0–8.3)
Total Bilirubin: 1.4 mg/dL — ABNORMAL HIGH (ref 0.3–1.2)

## 2014-06-03 LAB — AMMONIA: AMMONIA: 39 umol/L (ref 11–60)

## 2014-06-03 LAB — PROTIME-INR
INR: 2.14 — AB (ref 0.00–1.49)
Prothrombin Time: 23.9 seconds — ABNORMAL HIGH (ref 11.6–15.2)

## 2014-06-03 LAB — MAGNESIUM: MAGNESIUM: 1.9 mg/dL (ref 1.5–2.5)

## 2014-06-03 MED ORDER — FUROSEMIDE 10 MG/ML IJ SOLN
80.0000 mg | Freq: Every day | INTRAMUSCULAR | Status: DC
  Administered 2014-06-03 – 2014-06-10 (×8): 80 mg via INTRAVENOUS
  Filled 2014-06-03 (×9): qty 8

## 2014-06-03 NOTE — Progress Notes (Signed)
[facility] TEAM 1 - Stepdown/ICU TEAM Progress Note  JORENE Petersen VHQ:[id number] DOB: 09-11-1966 DOA: 05/22/2014 PCP: [name], MD  Admit HPI / Brief Narrative: 48 WF PMHx depression, seizure, HTN, cirrhosis of unclear etiology although does have prior history of polysubstance abuse including alcohol. Presented to [facility] ED 6/22 AM with hematemesis and decreased LOC. Recently discharged from [facility] after hospitalization for hepatic encephalopathy. Due to inability to protect airway she was intubated. GI was consulted and she was also placed on Octerotide infusion. EGD revealed large esophageal varices (requiring 7 bands) and portal gastropathy. She also underwent large volume paracentesis with 10,000 cc removed. She was eventually extubated and after treatment for encephalopathy she was able to awaken somewhat by 6/28. She was subsequently made a DNR this admission.  HPI/Subjective: 7/4 Alert withdrawn, follows all commands    Assessment/Plan: Cryptogenic cirrhosis:  A) Ascites  B) Coagulopathy  C) Thrombocytopenia  D) Esophageal varices and portal gastropathy  -more alert 7/2  -initial dx in 2009 and felt to be 2/2 ETOH but family denies pt with prior alcoholism so ?? NASH vs auto immune??  -MELD score 31 which equals a 52% mortality and Child-Pugh score is 14 which equals a Class C  -currently no signs of bleeding  -GI has documented if can get pt stable that should be considered for transplant list so will obtain ECHO esp in setting of acute renal failure  -due to GIB GI recommend cont empiric Rocephin for SBP prophylaxis -Counseled husband that he needed to speak with palliative care to clarify goals of care.   Hepatic encephalopathy  -receiving Rifaximin and Lactulose per tube-decrease Lactulose to 30 gm BID since alert ammonia normal on current tx  -mentation improved  -unable to safely take PO's so PANDA tube placed  -7/4 prior to discharge will need to discuss placement of  PEG tube (is it possible considering her ascites?)  Upper GI bleed due to esophageal varices  -Protonix 40 mg BID  per NG tube  -GI recommends repeat EGD around July 22  -Possible placement PEG tube?  Acute respiratory failure with hypoxemia  -primarily intubated for airway protection  -CXR 6/29 unremarkable for PNA   History SEIZURE, GRAND MAL  -was due to Alprazolam withdrawal   HYPERTENSION  -controlled off meds   Acute blood loss anemia  -admission hgb was 12 and now hanging around 7.7  -BP stable so defer transfusion at this time   Acute renal failure/hypernatremia  -Renal following and suspects pre renal etiology and not hepatorenal syndrome  -Continue free water to 400 ml q 4hr , continue D5W 75 ml/hr  DEPRESSIVE DISORDER, RCR, MODERATE  -Continue Prozac 20 mg daily  GERD   Substance abuse/Nicotine abuse  -unclear if pt utilized regular alcohol either surreptitiously or culturally family does not identify the degree to which she drank as problematic or high ie they denied she was "alcoholic"   Sore throat  -Continue Cetacaine spray TID PRN    Code Status: DO NOT RESUSCITATE  Family Communication: Son and daughter at bedside  Disposition Plan/Expected LOS: Husband has agreed to speak with palliative care for goals of care, home health  vs palliative care vs hospice     Consultants: Dr. Yancey Flemings (gastroenterology) Dr. Terrial Rhodes (nephrology)   Procedure/Significant Events: ETT6/22 >> 6/25, 6/25 >> 6/27  L IJ CVL 6/22 >>  7/1 echocardiogram - Left ventricle: mild LVH. LVEF= 60%.  - Left atrium: mildly dilated.    Culture UC 6/22 >>  negative  Resp 6/22 >> mod staph >> MRSA  Blood 6/22 >> canceled / not drawn  Peritoneal Fluid 6/22 >> rare WBC >> negative   Antibiotics: Ceftriaxone (SBP px) 6/22 >> d/c 6/28   DVT prophylaxis: SCD   Devices N/A   LINES / TUBES:  6/22 triple-lumen LIJ CVC    Continuous Infusions: . dextrose  75 mL/hr at 06/01/14 1517  . feeding supplement (GLUCERNA 1.2 CAL) 1,000 mL (06/03/14 0617)    Objective: VITAL SIGNS: Temp: 98.1 F (36.7 C) (07/04 0839) Temp src: Axillary (07/04 0839) BP: 144/53 mmHg (07/04 0839) Pulse Rate: 88 (07/04 0839) SPO2; 100% on room FIO2:   Intake/Output Summary (Last 24 hours) at 06/03/14 1015 Last data filed at 06/03/14 0900  Gross per 24 hour  Intake   4860 ml  Output   1650 ml  Net   3210 ml     Exam: General: Alert, much less interactive follows all commands (-) Jaundice  Lungs: Clear to auscultation bilaterally without wheezes or crackles, RA  Cardiovascular: Regular rate and rhythm without murmur gallop or rub normal S1 and S2, no peripheral edema or JVD  Abdomen: Nontender, distended with ascites, soft, bowel sounds positive, no rebound,no appreciable mass-Panda tube with continuous feedings infusing  Musculoskeletal: No significant cyanosis, clubbing of bilateral lower extremities, bilateral lower extremity 3+ pedal edema to hips   Data Reviewed: Basic Metabolic Panel:  Recent Labs Lab 05/28/14 0359  05/30/14 2200 05/31/14 1140 06/01/14 0500 06/02/14 0225 06/03/14 0500  NA 152*  < > 158* 158* 159* 155* 154*  K 3.8  < > 3.1* 3.6* 3.1* 3.5* 3.7  CL 116*  < > 123* 125* 126* 123* 121*  CO2 17*  < > 18* 17* 18* 19 18*  GLUCOSE 144*  < > 149* 144* 160* 159* 157*  BUN 35*  < > 31* 29* 27* 30* 36*  CREATININE 3.61*  < > 3.43* 3.33* 2.96* 2.92* 2.79*  CALCIUM 8.4  < > 8.9 8.5 7.8* 8.2* 8.1*  MG 1.5  --   --  1.7  --   --  1.9  < > = values in this interval not displayed. Liver Function Tests:  Recent Labs Lab 05/31/14 1140 06/01/14 0500 06/02/14 0225 06/03/14 0500  AST 48* 64* 84* 99*  ALT 16 19 23 27   ALKPHOS 162* 214* 297* 374*  BILITOT 1.7* 1.5* 1.5* 1.4*  PROT 6.6 6.1 6.5 6.5  ALBUMIN 3.3* 3.0* 3.0* 3.0*   No results found for this basename: LIPASE, AMYLASE,  in the last 168 hours  Recent Labs Lab 05/27/14 1800  05/31/14 1140 06/03/14 0500  AMMONIA 26 25 39   CBC:  Recent Labs Lab 05/28/14 0359 05/29/14 0504 05/30/14 0500 06/01/14 0500 06/03/14 0500  WBC 5.1 3.1* 5.4 4.3 6.4  NEUTROABS  --   --  3.4  --   --   HGB 8.2* 7.0* 7.7* 7.4* 7.6*  HCT 25.8* 21.8* 24.8* 23.8* 24.0*  MCV 97.0 98.2 100.0 98.3 98.8  PLT 39* 28* 42* 31* 25*   Cardiac Enzymes: No results found for this basename: CKTOTAL, CKMB, CKMBINDEX, TROPONINI,  in the last 168 hours BNP (last 3 results) No results found for this basename: PROBNP,  in the last 8760 hours CBG:  Recent Labs Lab 06/02/14 0737 06/02/14 1320 06/02/14 1629 06/02/14 2201 06/03/14 0840  GLUCAP 135* 138* 151* 123* 139*    No results found for this or any previous visit (from the past 240 hour(s)).  Studies:  Recent x-ray studies have been reviewed in detail by the Attending Physician  Scheduled Meds:  Scheduled Meds: . antiseptic oral rinse  15 mL Mouth Rinse QID  . calamine   Topical TID  . chlorhexidine  15 mL Mouth Rinse BID  . FLUoxetine  20 mg Per Tube Daily  . free water  400 mL Per Tube Q4H  . insulin aspart  0-15 Units Subcutaneous TID WC  . insulin aspart  0-5 Units Subcutaneous QHS  . lactulose  30 g Per Tube BID  . pantoprazole sodium  40 mg Per Tube BID  . potassium chloride  20 mEq Per Tube BID  . rifaximin  550 mg Per Tube BID    Time spent on care of this patient: 40 mins   Drema DallasWOODS, CURTIS, J , MD   [facility] Office  [phone] Pager [phone]- [phone]  On-Call/Text Page:      [email or web address]      password [email or web address]  If 7PM-7AM, please contact night-coverage [email or web address] Password [email or web address] 06/03/2014, 10:15 AM   LOS: 12 days

## 2014-06-03 NOTE — Progress Notes (Signed)
S: Pt confused. Family at bedside.   O:BP 144/53  Pulse 88  Temp(Src) 98.1 F (36.7 C) (Axillary)  Resp 17  Ht 5\' 4"  (1.626 m)  Wt 199 lb 11.8 oz (90.6 kg)  BMI 34.27 kg/m2  SpO2 100%  LMP 05/25/2014  Intake/Output Summary (Last 24 hours) at 06/03/14 1201 Last data filed at 06/03/14 1000  Gross per 24 hour  Intake   4995 ml  Output   1650 ml  Net   3345 ml   Intake/Output: I/O last 3 completed shifts: In: 627425 [I.V.:2625; NG/GT:4800] Out: 1525 [Urine:725; Stool:800]  Intake/Output this shift:  Total I/O In: 405 [I.V.:225; NG/GT:180] Out: 350 [Urine:350] Weight change: 0 lb (0 kg)  Physical exam-  Gen: Not in any distress, confused speech, with multiple ecchymossis on forearms.  HEENT- AT, [place], Eyes open, exam, limited. CVS: Regular rate, no added sounds  Resp: Clear to auscultation bilat  Abd: Distended, not tender to palpation, bowel sounds not heard, likely due to distension, fluid thrill present.  Ext:  pitting pedal edema.  Neuro- Limited exam, confused speech, by pts mental status.   Recent Labs Lab 05/29/14 2210 05/30/14 1010 05/30/14 2200 05/31/14 1140 06/01/14 0500 06/02/14 0225 06/03/14 0500  NA 159* 161* 158* 158* 159* 155* 154*  K 3.5* 3.3* 3.1* 3.6* 3.1* 3.5* 3.7  CL 125* 126* 123* 125* 126* 123* 121*  CO2 18* 19 18* 17* 18* 19 18*  GLUCOSE 127* 132* 149* 144* 160* 159* 157*  BUN 33* 32* 31* 29* 27* 30* 36*  CREATININE 3.60* 3.61* 3.43* 3.33* 2.96* 2.92* 2.79*  ALBUMIN  --   --   --  3.3* 3.0* 3.0* 3.0*  CALCIUM 8.9 8.8 8.9 8.5 7.8* 8.2* 8.1*  AST  --   --   --  48* 64* 84* 99*  ALT  --   --   --  16 19 23 27    Liver Function Tests:  Recent Labs Lab 06/01/14 0500 06/02/14 0225 06/03/14 0500  AST 64* 84* 99*  ALT 19 23 27   ALKPHOS 214* 297* 374*  BILITOT 1.5* 1.5* 1.4*  PROT 6.1 6.5 6.5  ALBUMIN 3.0* 3.0* 3.0*   No results found for this basename: LIPASE, AMYLASE,  in the last 168 hours  Recent Labs Lab 05/27/14 1800  05/31/14 1140 06/03/14 0500  AMMONIA 26 25 39   CBC:  Recent Labs Lab 05/28/14 0359 05/29/14 0504 05/30/14 0500 06/01/14 0500 06/03/14 0500  WBC 5.1 3.1* 5.4 4.3 6.4  NEUTROABS  --   --  3.4  --   --   HGB 8.2* 7.0* 7.7* 7.4* 7.6*  HCT 25.8* 21.8* 24.8* 23.8* 24.0*  MCV 97.0 98.2 100.0 98.3 98.8  PLT 39* 28* 42* 31* 25*   Cardiac Enzymes: No results found for this basename: CKTOTAL, CKMB, CKMBINDEX, TROPONINI,  in the last 168 hours CBG:  Recent Labs Lab 06/02/14 0737 06/02/14 1320 06/02/14 1629 06/02/14 2201 06/03/14 0840  GLUCAP 135* 138* 151* 123* 139*    Iron Studies: No results found for this basename: IRON, TIBC, TRANSFERRIN, FERRITIN,  in the last 72 hours Studies/Results: No results found. Marland Kitchen. antiseptic oral rinse  15 mL Mouth Rinse QID  . calamine   Topical TID  . chlorhexidine  15 mL Mouth Rinse BID  . FLUoxetine  20 mg Per Tube Daily  . free water  400 mL Per Tube Q4H  . insulin aspart  0-15 Units Subcutaneous TID WC  . insulin aspart  0-5 Units  Subcutaneous QHS  . lactulose  30 g Per Tube BID  . pantoprazole sodium  40 mg Per Tube BID  . potassium chloride  20 mEq Per Tube BID  . rifaximin  550 mg Per Tube BID    BMET    Component Value Date/Time   NA 154* 06/03/2014 0500   NA 138 04/19/2013 1109   K 3.7 06/03/2014 0500   K 3.0* 04/19/2013 1109   CL 121* 06/03/2014 0500   CL 99 04/19/2013 1109   CO2 18* 06/03/2014 0500   CO2 27 04/19/2013 1109   GLUCOSE 157* 06/03/2014 0500   GLUCOSE 115* 04/19/2013 1109   BUN 36* 06/03/2014 0500   BUN 11.0 04/19/2013 1109   CREATININE 2.79* 06/03/2014 0500   CREATININE 0.8 04/19/2013 1109   CALCIUM 8.1* 06/03/2014 0500   CALCIUM 7.5* 04/19/2013 1109   GFRNONAA 19* 06/03/2014 0500   GFRAA 22* 06/03/2014 0500   CBC    Component Value Date/Time   WBC 6.4 06/03/2014 0500   WBC 2.9* 04/19/2013 1109   RBC 2.43* 06/03/2014 0500   RBC 3.77 04/19/2013 1109   HGB 7.6* 06/03/2014 0500   HGB 12.3 04/19/2013 1109   HCT 24.0* 06/03/2014 0500    HCT 36.0 04/19/2013 1109   PLT 25* 06/03/2014 0500   PLT 39* 04/19/2013 1109   MCV 98.8 06/03/2014 0500   MCV 95.4 04/19/2013 1109   MCH 31.3 06/03/2014 0500   MCH 32.6 04/19/2013 1109   MCHC 31.7 06/03/2014 0500   MCHC 34.2 04/19/2013 1109   RDW 22.5* 06/03/2014 0500   RDW 15.8* 04/19/2013 1109   LYMPHSABS 1.2 05/30/2014 0500   LYMPHSABS 0.5* 04/19/2013 1109   MONOABS 0.4 05/30/2014 0500   MONOABS 0.2 04/19/2013 1109   EOSABS 0.3 05/30/2014 0500   EOSABS 0.1 04/19/2013 1109   BASOSABS 0.1 05/30/2014 0500   BASOSABS 0.0 04/19/2013 1109     Assessment/Plan:  1- AKI- Aetiology prerenal Vs hepatorenal syndrome.  Cr about the same today 2.79, from 2.92 yesterday. Responded to IV lasix dose yesterday with 725mls of urine. Might help to lay on side, as massive ascities, might be imparing return and worseing edema. Consider PAlliative care consult.  2. Hypernatremia- 154 today. Not much improvement Likley due to lasix dose. Free water increased  today, 400mls Q4H and on D5W at 75cc/hr.  3. Hepatic encephalopathy on lactulose-Ammonia 25 05/31/2014, down from 177 05/22/2014.  4. ABLA- Esophageal Varices. On PPI. Per GI  5. Cirrhosis- ?NASH. Will need transplant refferal.   6. 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 With ecchymosis. Avoid anticoagulants.   7. Hypokalemia- 3.7 today, 20meq of kcl BID.   Emokpae, Ejiroghene  IMTS  PGY- 2 I have seen and examined this patient and agree with plan as outlined by Dr. Mariea ClontsEmokpae.  Will dose with IV lasix again given significant anasarca.  Despite numbers improving her overall prognosis remains poor.  Agree with palliative care consult. Viridiana Spaid A,MD 06/03/2014 12:31 PM

## 2014-06-03 NOTE — Progress Notes (Signed)
HISTORY OF PRESENT ILLNESS:  Dana Petersen is a 48 y.o. female with end-stage liver disease admitted with variceal bleed. Interval history and data reviewed. No interval setbacks or significant improvement. Family remains at bedside  REVIEW OF SYSTEMS:  All non-GI ROS negative except for  Past Medical History  Diagnosis Date  . ABNORMAL FINDINGS GI TRACT 09/12/2008  . ABNORMAL TRANSAMINASE-LFT'S 09/12/2008  . Acute bronchitis 07/21/2008  . Cirrhosis of liver without mention of alcohol 08/11/2008    sees Dr. Yancey FlemingsJohn Madysen Faircloth  . DEPRESSIVE DISORDER, RCR, MODERATE 07/09/2007  . DIABETES MELLITUS, TYPE II 07/09/2007  . GERD 09/07/2008  . Headache(784.0) 09/24/2010  . HIATAL HERNIA 09/07/2008  . HYPERTENSION 07/09/2007  . LOW BACK PAIN 07/02/2010  . Overweight(278.02) 10/01/2007  . 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 Seizures   . Anemia   . Pancytopenia     sees Dr. Eli HoseFiras Shadad   . Thrombocytopenia, unspecified 04/19/2013  . Anemia, unspecified 04/19/2013    Past Surgical History  Procedure Laterality Date  . Tubal ligation    . Carpal tunnel release    . Lumbar laminectomy      L5-S1  dr. Marianne Sofiaappling  . Esophagogastroduodenoscopy N/A 05/22/2014    Procedure: ESOPHAGOGASTRODUODENOSCOPY (EGD);  Surgeon: Theda BelfastPatrick D Hung, MD;  [facility];  Service: Endoscopy;  Laterality: N/A;  bedside     Social History Micheal B Alessandra BevelsVaughn  reports that she has been smoking Cigarettes.  She has been smoking about 0.00 packs per day. She has never used smokeless tobacco. She reports that she does not drink alcohol or use illicit drugs.  family history is negative for Diabetes, Hypertension, and Cancer.  Allergies  Allergen Reactions  . Aspirin Other (See Comments)    Causes elevated liver enzymes  . Phenergan [Promethazine Hcl] Other (See Comments)    Muscle spasms  . Tylenol [Acetaminophen]     Caused elevated liver enzymes       PHYSICAL EXAMINATION: Vital signs: BP 132/56  Pulse 89   Temp(Src) 97.8 F (36.6 C) (Axillary)  Resp 19  Ht 5\' 4"  (1.626 m)  Wt 199 lb 11.8 oz (90.6 kg)  BMI 34.27 kg/m2  SpO2 100%  LMP 05/25/2014 General: Terminally ill appearing, no acute distress HEENT: Sclerae are icteric, conjunctiva pink. Oral mucosa dry Lungs: Clear with dullness at the bases Heart: Regular with systolic murmur Abdomen: Markedly  distended, with obvious ascites, no peritoneal signs, normal bowel sounds. No organomegaly. Extremities: 2+ edema Psychiatric: alert and minimally communicative. Follows simple commands   ASSESSMENT:  #1. End-stage liver disease complicated by acute variceal bleed. Other problems include gross debilitation, ascites, coagulopathy, encephalopathy. #2. Renal failure #3. Electrolyte imbalance  PLAN:  #1. Continue with supportive care. #2. Continue tube feeds #3. Overall poor prognosis. Long discussion with the family again. They are considering palliative care consultation.  Wilhemina BonitoJohn N. Eda KeysPerry, Jr., M.D. [facility] of [facility]

## 2014-06-04 DIAGNOSIS — E876 Hypokalemia: Secondary | ICD-10-CM

## 2014-06-04 DIAGNOSIS — I10 Essential (primary) hypertension: Secondary | ICD-10-CM

## 2014-06-04 LAB — GLUCOSE, CAPILLARY
GLUCOSE-CAPILLARY: 167 mg/dL — AB (ref 70–99)
Glucose-Capillary: 143 mg/dL — ABNORMAL HIGH (ref 70–99)
Glucose-Capillary: 153 mg/dL — ABNORMAL HIGH (ref 70–99)
Glucose-Capillary: 160 mg/dL — ABNORMAL HIGH (ref 70–99)
Glucose-Capillary: 135 mg/dL — ABNORMAL HIGH (ref 70–99)

## 2014-06-04 LAB — COMPREHENSIVE METABOLIC PANEL
ALT: 35 U/L (ref 0–35)
ANION GAP: 14 (ref 5–15)
AST: 124 U/L — AB (ref 0–37)
Albumin: 2.9 g/dL — ABNORMAL LOW (ref 3.5–5.2)
Alkaline Phosphatase: 424 U/L — ABNORMAL HIGH (ref 39–117)
BILIRUBIN TOTAL: 1.5 mg/dL — AB (ref 0.3–1.2)
BUN: 41 mg/dL — AB (ref 6–23)
CHLORIDE: 118 meq/L — AB (ref 96–112)
CO2: 19 meq/L (ref 19–32)
CREATININE: 2.68 mg/dL — AB (ref 0.50–1.10)
Calcium: 8.3 mg/dL — ABNORMAL LOW (ref 8.4–10.5)
GFR calc Af Amer: 23 mL/min — ABNORMAL LOW (ref >90)
GFR, EST NON AFRICAN AMERICAN: 20 mL/min — AB (ref >90)
Glucose, Bld: 157 mg/dL — ABNORMAL HIGH (ref 70–99)
Potassium: 3.9 mEq/L (ref 3.7–5.3)
Sodium: 151 mEq/L — ABNORMAL HIGH (ref 137–147)
Total Protein: 6.5 g/dL (ref 6.0–8.3)

## 2014-06-04 LAB — CBC
HEMATOCRIT: 24.4 % — AB (ref 36.0–46.0)
Hemoglobin: 7.5 g/dL — ABNORMAL LOW (ref 12.0–15.0)
MCH: 30.4 pg (ref 26.0–34.0)
MCHC: 30.7 g/dL (ref 30.0–36.0)
MCV: 98.8 fL (ref 78.0–100.0)
PLATELETS: 26 10*3/uL — AB (ref 150–400)
RBC: 2.47 MIL/uL — ABNORMAL LOW (ref 3.87–5.11)
RDW: 22.8 % — ABNORMAL HIGH (ref 11.5–15.5)
WBC: 8 10*3/uL (ref 4.0–10.5)

## 2014-06-04 LAB — MAGNESIUM: MAGNESIUM: 1.8 mg/dL (ref 1.5–2.5)

## 2014-06-04 LAB — AMMONIA: AMMONIA: 24 umol/L (ref 11–60)

## 2014-06-04 MED ORDER — DEXTROSE 5 % IV SOLN
1.0000 g | INTRAVENOUS | Status: DC
  Administered 2014-06-04 – 2014-06-10 (×7): 1 g via INTRAVENOUS
  Filled 2014-06-04 (×7): qty 10

## 2014-06-04 NOTE — Progress Notes (Signed)
[facility] TEAM 1 - Stepdown/ICU TEAM Progress Note  ANGELLICA MADDISON ZOX:[id number] DOB: 12-09-1965 DOA: 05/22/2014 PCP: [name], MD  Admit HPI / Brief Narrative: 48 WF PMHx depression, seizure, HTN, cirrhosis of unclear etiology although does have prior history of polysubstance abuse including alcohol. Presented to [facility] ED 6/22 AM with hematemesis and decreased LOC. Recently discharged from [facility] after hospitalization for hepatic encephalopathy. Due to inability to protect airway she was intubated. GI was consulted and she was also placed on Octerotide infusion. EGD revealed large esophageal varices (requiring 7 bands) and portal gastropathy. She also underwent large volume paracentesis with 10,000 cc removed. She was eventually extubated and after treatment for encephalopathy she was able to awaken somewhat by 6/28. She was subsequently made a DNR this admission.  HPI/Subjective: 7/4 Alert withdrawn, follows all commands    Assessment/Plan: Cryptogenic cirrhosis:  -Counseled husband, son and daughter that the function of the liver will not return and at this juncture he is symptomatic management. -Husband would like to speak with the palliative care concerning short-term/long-term goals of care home health vs SNF vs hospice. -Patient is bed bound and will require [employer] or [employer] in rotational mode in order to decrease risk sacral decubitus  Ascites  -Counseled patient family this is a symptom of liver failure, and the best we can hope for is to be able to occasionally perform paracentesis for symptom management. Currently patient's renal function will not allow a therapeutic paracentesis.  Coagulopathy -INR remained high by: On 7/4= 2.14, currently no signs of active bleeding monitor closely   Thrombocytopenia  -Continued severe thrombocytopenia, no evidence currently of bleeding monitor closely  Esophageal varices and portal gastropathy  -more alert 7/2  -initial dx  in 2009 and felt to be 2/2 ETOH but family denies pt with prior alcoholism so ?? NASH vs auto immune??  -MELD score 31 which equals a 52% mortality and Child-Pugh score is 14 which equals a Class C  -currently no signs of bleeding  -GI has documented if can get pt stable that should be considered for transplant list so will obtain ECHO esp in setting of acute renal failure  -due to GIB GI recommend continue Rocephin 1 g daily for SBP prophylaxis -Counseled husband that he needed to speak with palliative care to clarify goals of care.   Hepatic encephalopathy  -receiving Rifaximin and Lactulose per tube-decrease Lactulose to 30 gm BID since alert ammonia normal on current tx  -mentation improved  -unable to safely take PO's so PANDA tube placed; PEG placement not possible secondary to ascites   Upper GI bleed due to esophageal varices  -Protonix 40 mg BID  per NG tube  -GI recommends repeat EGD around July 22   Acute respiratory failure with hypoxemia  -primarily intubated for airway protection  -CXR 6/29 unremarkable for PNA   History SEIZURE, GRAND MAL  -was due to Alprazolam withdrawal   HYPERTENSION  -controlled off meds   Acute blood loss anemia  -admission hgb was 12 and now hanging around 7.7; continues to be stable    Acute renal failure/hypernatremia  -Renal following pre renal etiology vs hepatorenal syndrome (10 L removed last paracentesis) -Continue free water to 400 ml q 4hr , continue D5W 75 ml/hr  DEPRESSIVE DISORDER, RCR, MODERATE  -Continue Prozac 20 mg daily  GERD   Substance abuse/Nicotine abuse  -unclear if pt utilized regular alcohol either surreptitiously or culturally family does not identify the degree to which she  drank as problematic or high ie they denied she was "alcoholic"   Sore throat  -Continue Cetacaine spray TID PRN    Code Status: DO NOT RESUSCITATE  Family Communication: None   Disposition Plan/Expected LOS: Husband has agreed to  speak with palliative care for goals of care, home health  vs palliative care vs hospice when ready     Consultants: Dr. Yancey FlemingsJohn Perry (gastroenterology) Dr. Terrial RhodesJoseph Coladonato (nephrology)   Procedure/Significant Events: ETT6/22 >> 6/25, 6/25 >> 6/27  L IJ CVL 6/22 >>  7/1 echocardiogram - Left ventricle: mild LVH. LVEF= 60%.  - Left atrium: mildly dilated.    Culture UC 6/22 >> negative  Resp 6/22 >> mod staph >> MRSA  Blood 6/22 >> canceled / not drawn  Peritoneal Fluid 6/22 >> rare WBC >> negative   Antibiotics: Ceftriaxone (SBP px) 6/22 >> d/c 6/28; restarted 7/5>>   DVT prophylaxis: SCD   Devices N/A   LINES / TUBES:  6/22 triple-lumen LIJ CVC    Continuous Infusions: . dextrose 75 mL/hr at 06/03/14 2032  . feeding supplement (GLUCERNA 1.2 CAL) 1,000 mL (06/04/14 0443)    Objective: VITAL SIGNS: Temp: 97.9 F (36.6 C) (07/05 0803) Temp src: Axillary (07/05 0803) BP: 142/54 mmHg (07/05 0803) Pulse Rate: 83 (07/05 0803) SPO2; 100% on room FIO2:   Intake/Output Summary (Last 24 hours) at 06/04/14 0909 Last data filed at 06/04/14 0443  Gross per 24 hour  Intake   3144 ml  Output   1525 ml  Net   1619 ml     Exam: General: Alert, answers questions with node of head, attempts to speak,follows all commands (-) Jaundice  Lungs: Clear to auscultation bilaterally without wheezes or crackles, RA  Cardiovascular: Regular rate and rhythm without murmur gallop or rub normal S1 and S2, no peripheral edema or JVD  Abdomen: Nontender, distended with ascites, soft, bowel sounds positive, no rebound,no appreciable mass-Panda tube with continuous feedings infusing  Musculoskeletal: No significant cyanosis, clubbing of bilateral lower extremities, bilateral lower extremity 3+ pedal edema to hips   Data Reviewed: Basic Metabolic Panel:  Recent Labs Lab 05/31/14 1140 06/01/14 0500 06/02/14 0225 06/03/14 0500 06/04/14 0400  NA 158* 159* 155* 154* 151*    K 3.6* 3.1* 3.5* 3.7 3.9  CL 125* 126* 123* 121* 118*  CO2 17* 18* 19 18* 19  GLUCOSE 144* 160* 159* 157* 157*  BUN 29* 27* 30* 36* 41*  CREATININE 3.33* 2.96* 2.92* 2.79* 2.68*  CALCIUM 8.5 7.8* 8.2* 8.1* 8.3*  MG 1.7  --   --  1.9 1.8   Liver Function Tests:  Recent Labs Lab 05/31/14 1140 06/01/14 0500 06/02/14 0225 06/03/14 0500 06/04/14 0400  AST 48* 64* 84* 99* 124*  ALT 16 19 23 27  35  ALKPHOS 162* 214* 297* 374* 424*  BILITOT 1.7* 1.5* 1.5* 1.4* 1.5*  PROT 6.6 6.1 6.5 6.5 6.5  ALBUMIN 3.3* 3.0* 3.0* 3.0* 2.9*   No results found for this basename: LIPASE, AMYLASE,  in the last 168 hours  Recent Labs Lab 05/31/14 1140 06/03/14 0500 06/04/14 0400  AMMONIA 25 39 24   CBC:  Recent Labs Lab 05/29/14 0504 05/30/14 0500 06/01/14 0500 06/03/14 0500 06/04/14 0400  WBC 3.1* 5.4 4.3 6.4 8.0  NEUTROABS  --  3.4  --   --   --   HGB 7.0* 7.7* 7.4* 7.6* 7.5*  HCT 21.8* 24.8* 23.8* 24.0* 24.4*  MCV 98.2 100.0 98.3 98.8 98.8  PLT 28* 42* 31*  25* 26*   Cardiac Enzymes: No results found for this basename: CKTOTAL, CKMB, CKMBINDEX, TROPONINI,  in the last 168 hours BNP (last 3 results) No results found for this basename: PROBNP,  in the last 8760 hours CBG:  Recent Labs Lab 06/02/14 2201 06/03/14 0840 06/03/14 1250 06/03/14 1718 06/03/14 2105  GLUCAP 123* 139* 159* 143* 129*    No results found for this or any previous visit (from the past 240 hour(s)).   Studies:  Recent x-ray studies have been reviewed in detail by the Attending Physician  Scheduled Meds:  Scheduled Meds: . antiseptic oral rinse  15 mL Mouth Rinse QID  . calamine   Topical TID  . chlorhexidine  15 mL Mouth Rinse BID  . FLUoxetine  20 mg Per Tube Daily  . free water  400 mL Per Tube Q4H  . furosemide  80 mg Intravenous Daily  . insulin aspart  0-15 Units Subcutaneous TID WC  . insulin aspart  0-5 Units Subcutaneous QHS  . lactulose  30 g Per Tube BID  . pantoprazole sodium  40  mg Per Tube BID  . potassium chloride  20 mEq Per Tube BID  . rifaximin  550 mg Per Tube BID    Time spent on care of this patient: 40 mins   Drema DallasWOODS, Ines Warf, J , MD   [facility] Office  [phone] Pager [phone]- [phone]  On-Call/Text Page:      [email or web address]      password [email or web address]  If 7PM-7AM, please contact night-coverage [email or web address] Password [email or web address] 06/04/2014, 9:09 AM   LOS: 13 days

## 2014-06-04 NOTE — Progress Notes (Signed)
S: VSS.  Pt same this AM.  Good UOP: 1.8L yesterday.    O:BP 126/66  Pulse 85  Temp(Src) 98.2 F (36.8 C) (Axillary)  Resp 14  Ht 5\' 4"  (1.626 m)  Wt 199 lb 11.8 oz (90.6 kg)  BMI 34.27 kg/m2  SpO2 100%  LMP 05/25/2014  Intake/Output Summary (Last 24 hours) at 06/04/14 1153 Last data filed at 06/04/14 0443  Gross per 24 hour  Intake   2934 ml  Output   1525 ml  Net   1409 ml   Intake/Output: I/O last 3 completed shifts: In: 5934 [I.V.:2475; NG/GT:3459] Out: 1875 [Urine:1875]   Weight change: 204-->199 lbs since admission Gen: NAD HEENT: [place]/AT, scleral icterus  CVS: RRR Resp: CTAB Abd: Distended, not tender to palpation  Ext: bilateral pitting pedal edema Neuro: Alert, limited exam, confused   Recent Labs Lab 05/30/14 1010 05/30/14 2200 05/31/14 1140 06/01/14 0500 06/02/14 0225 06/03/14 0500 06/04/14 0400  NA 161* 158* 158* 159* 155* 154* 151*  K 3.3* 3.1* 3.6* 3.1* 3.5* 3.7 3.9  CL 126* 123* 125* 126* 123* 121* 118*  CO2 19 18* 17* 18* 19 18* 19  GLUCOSE 132* 149* 144* 160* 159* 157* 157*  BUN 32* 31* 29* 27* 30* 36* 41*  CREATININE 3.61* 3.43* 3.33* 2.96* 2.92* 2.79* 2.68*  ALBUMIN  --   --  3.3* 3.0* 3.0* 3.0* 2.9*  CALCIUM 8.8 8.9 8.5 7.8* 8.2* 8.1* 8.3*  AST  --   --  48* 64* 84* 99* 124*  ALT  --   --  16 19 23 27  35   Liver Function Tests:  Recent Labs Lab 06/02/14 0225 06/03/14 0500 06/04/14 0400  AST 84* 99* 124*  ALT 23 27 35  ALKPHOS 297* 374* 424*  BILITOT 1.5* 1.4* 1.5*  PROT 6.5 6.5 6.5  ALBUMIN 3.0* 3.0* 2.9*   No results found for this basename: LIPASE, AMYLASE,  in the last 168 hours  Recent Labs Lab 05/31/14 1140 06/03/14 0500 06/04/14 0400  AMMONIA 25 39 24   CBC:  Recent Labs Lab 05/29/14 0504 05/30/14 0500 06/01/14 0500 06/03/14 0500 06/04/14 0400  WBC 3.1* 5.4 4.3 6.4 8.0  NEUTROABS  --  3.4  --   --   --   HGB 7.0* 7.7* 7.4* 7.6* 7.5*  HCT 21.8* 24.8* 23.8* 24.0* 24.4*  MCV 98.2 100.0 98.3 98.8 98.8   PLT 28* 42* 31* 25* 26*   Cardiac Enzymes: No results found for this basename: CKTOTAL, CKMB, CKMBINDEX, TROPONINI,  in the last 168 hours CBG:  Recent Labs Lab 06/03/14 0840 06/03/14 1250 06/03/14 1718 06/03/14 2105 06/04/14 0933  GLUCAP 139* 159* 143* 129* 143*    Iron Studies: No results found for this basename: IRON, TIBC, TRANSFERRIN, FERRITIN,  in the last 72 hours Studies/Results: No results found. Marland Kitchen. antiseptic oral rinse  15 mL Mouth Rinse QID  . calamine   Topical TID  . cefTRIAXone (ROCEPHIN)  IV  1 g Intravenous Q24H  . chlorhexidine  15 mL Mouth Rinse BID  . FLUoxetine  20 mg Per Tube Daily  . free water  400 mL Per Tube Q4H  . furosemide  80 mg Intravenous Daily  . insulin aspart  0-15 Units Subcutaneous TID WC  . insulin aspart  0-5 Units Subcutaneous QHS  . lactulose  30 g Per Tube BID  . pantoprazole sodium  40 mg Per Tube BID  . potassium chloride  20 mEq Per Tube BID  . rifaximin  550 mg Per Tube BID    BMET    Component Value Date/Time   NA 151* 06/04/2014 0400   NA 138 04/19/2013 1109   K 3.9 06/04/2014 0400   K 3.0* 04/19/2013 1109   CL 118* 06/04/2014 0400   CL 99 04/19/2013 1109   CO2 19 06/04/2014 0400   CO2 27 04/19/2013 1109   GLUCOSE 157* 06/04/2014 0400   GLUCOSE 115* 04/19/2013 1109   BUN 41* 06/04/2014 0400   BUN 11.0 04/19/2013 1109   CREATININE 2.68* 06/04/2014 0400   CREATININE 0.8 04/19/2013 1109   CALCIUM 8.3* 06/04/2014 0400   CALCIUM 7.5* 04/19/2013 1109   GFRNONAA 20* 06/04/2014 0400   GFRAA 23* 06/04/2014 0400   CBC    Component Value Date/Time   WBC 8.0 06/04/2014 0400   WBC 2.9* 04/19/2013 1109   RBC 2.47* 06/04/2014 0400   RBC 3.77 04/19/2013 1109   HGB 7.5* 06/04/2014 0400   HGB 12.3 04/19/2013 1109   HCT 24.4* 06/04/2014 0400   HCT 36.0 04/19/2013 1109   PLT 26* 06/04/2014 0400   PLT 39* 04/19/2013 1109   MCV 98.8 06/04/2014 0400   MCV 95.4 04/19/2013 1109   MCH 30.4 06/04/2014 0400   MCH 32.6 04/19/2013 1109   MCHC 30.7 06/04/2014 0400   MCHC 34.2  04/19/2013 1109   RDW 22.8* 06/04/2014 0400   RDW 15.8* 04/19/2013 1109   LYMPHSABS 1.2 05/30/2014 0500   LYMPHSABS 0.5* 04/19/2013 1109   MONOABS 0.4 05/30/2014 0500   MONOABS 0.2 04/19/2013 1109   EOSABS 0.3 05/30/2014 0500   EOSABS 0.1 04/19/2013 1109   BASOSABS 0.1 05/30/2014 0500   BASOSABS 0.0 04/19/2013 1109    Assessment/Plan:  1. AKI- Etiology likely hepatorenal.  Cr trending down.  Family interested in discussing with palliative care.   2. Hypernatremia- 151 today, continue D5W 5875ml/h and free water 400ml q4h per NGT.    3. Hepatic encephalopathy- continue lactulose, no BM documented yesterday.   4. ABLA- Esophageal varices, on PPI, ceftriaxone, and rifaximin.  Mgmt per GI.   5. 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  Family to discuss with palliative care.   6. Thrombocytopenia- stable, multiple ecchymosis. Continue to avoid anticoagulants.   Boykin PeekGill, Jacquelyn, MD IMTS, PGY-2  I have seen and examined this patient and agree with plan as outlined by Dr. Delane GingerGill.  Mrs. Martyn EhrichVaughn's numbers are improving however her volume status is not, nor is her encephalopathy.  AKI likely combo of ischemic ATN in setting of variceal bleed as well as hemodynamically mediated pre-renal azotemia.  Renal function is slowly improving but overall prognosis remains poor.  Agree with current therapy and with palliative care consult. Marquetta Weiskopf A,MD 06/04/2014 12:16 PM

## 2014-06-04 NOTE — Progress Notes (Signed)
Daily Rounding Note  06/04/2014, 9:56 AM  LOS: 13 days   SUBJECTIVE:       Urine output: 1.8 liters yesterday.  Stool output: nothing entered/recorded for 7/4.  There is about 60 cc of liquid brown stool stool in flexiseal bag. Wt stable for previous 24 hours but up 3.5 Kg in last 3 days.  Tolerating tube feeds.   OBJECTIVE:         Vital signs in last 24 hours:    Temp:  [97.8 F (36.6 C)-98.5 F (36.9 C)] 97.9 F (36.6 C) (07/05 0803) Pulse Rate:  [83-95] 83 (07/05 0803) Resp:  [13-19] 13 (07/05 0803) BP: (132-147)/(54-79) 142/54 mmHg (07/05 0803) SpO2:  [98 %-100 %] 100 % (07/05 0803) Last BM Date: 06/03/14 General: more alert and actually speaking this AM   Heart: RRR Chest: clear bil.  No labored breathing Abdomen: distended, tense.  Umbilical hernia has subsided  Extremities: anasarca to thighs Neuro/Psych:  More responsive, following simple commands.  Short word answers to simple questions.   Intake/Output from previous day: 07/04 0701 - 07/05 0700 In: 3414 [I.V.:1575; NG/GT:1839] Out: 1875 [Urine:1875]  Intake/Output this shift:    Lab Results:  Recent Labs  06/03/14 0500 06/04/14 0400  WBC 6.4 8.0  HGB 7.6* 7.5*  HCT 24.0* 24.4*  PLT 25* 26*   BMET  Recent Labs  06/02/14 0225 06/03/14 0500 06/04/14 0400  NA 155* 154* 151*  K 3.5* 3.7 3.9  CL 123* 121* 118*  CO2 19 18* 19  GLUCOSE 159* 157* 157*  BUN 30* 36* 41*  CREATININE 2.92* 2.79* 2.68*  CALCIUM 8.2* 8.1* 8.3*   LFT  Recent Labs  06/02/14 0225 06/03/14 0500 06/04/14 0400  PROT 6.5 6.5 6.5  ALBUMIN 3.0* 3.0* 2.9*  AST 84* 99* 124*  ALT 23 27 35  ALKPHOS 297* 374* 424*  BILITOT 1.5* 1.4* 1.5*   PT/INR  Recent Labs  06/03/14 0500  LABPROT 23.9*  INR 2.14*   Scheduled Meds: . antiseptic oral rinse  15 mL Mouth Rinse QID  . calamine   Topical TID  . cefTRIAXone (ROCEPHIN)  IV  1 g Intravenous Q24H  .  chlorhexidine  15 mL Mouth Rinse BID  . FLUoxetine  20 mg Per Tube Daily  . free water  400 mL Per Tube Q4H  . furosemide  80 mg Intravenous Daily  . insulin aspart  0-15 Units Subcutaneous TID WC  . insulin aspart  0-5 Units Subcutaneous QHS  . lactulose  30 g Per Tube BID  . pantoprazole sodium  40 mg Per Tube BID  . potassium chloride  20 mEq Per Tube BID  . rifaximin  550 mg Per Tube BID   Continuous Infusions: . dextrose 75 mL/hr at 06/03/14 2032  . feeding supplement (GLUCERNA 1.2 CAL) 1,000 mL (06/04/14 0443)   PRN Meds:.butamben-tetracaine-benzocaine, ondansetron (ZOFRAN) IV    ASSESMENT:   *  ESLD.  *  Renal failure.  Urine output improved with increase in free water boluses.   *  Hyponatremia, improved with increased free water.   *  Thrombocytopenia. Steadily worse.   *  Coagulopathy.  Improved since 7/1.   *  Encephalopathy.  On Lactulose and Rocephin.   *  Variceal bleed.  EGD 6/22: banding of large varices.Completed Octreotide drip.  On Protonix via tube.  Continues on Rocephin.   *  Ascites.  Paracentesis at bedside 6/23: no SBP  *  Normocytic anemia.  Hgb relatively stable.    PLAN   *  Continue supportive care.  Still do not think paracentesis is advisable in setting of AKI, possible HRS    Jennye MoccasinSarah Gribbin  06/04/2014, 9:56 AM Pager: [phone]  GI ATTENDING  Interval history data reviewed. Patient personally seen and examined. Agree with above H&P. Husband in room. I agree that she is much more alert today. Follows commands. Trying to speak. Slight improvement in sodium and creatinine continues. Recommend continuing all supportive measures. At some point, may be reasonable to perform moderate paracentesis with albumin replacement. Will continue to follow.  Wilhemina BonitoJohn N. Eda KeysPerry, Jr., M.D. [facility] Division of Gastroenterology

## 2014-06-05 ENCOUNTER — Inpatient Hospital Stay (HOSPITAL_COMMUNITY): Payer: Medicaid Other

## 2014-06-05 LAB — COMPREHENSIVE METABOLIC PANEL
ALT: 38 U/L — AB (ref 0–35)
ANION GAP: 13 (ref 5–15)
AST: 113 U/L — ABNORMAL HIGH (ref 0–37)
Albumin: 2.8 g/dL — ABNORMAL LOW (ref 3.5–5.2)
Alkaline Phosphatase: 431 U/L — ABNORMAL HIGH (ref 39–117)
BUN: 44 mg/dL — ABNORMAL HIGH (ref 6–23)
CO2: 19 mEq/L (ref 19–32)
Calcium: 8.1 mg/dL — ABNORMAL LOW (ref 8.4–10.5)
Chloride: 116 mEq/L — ABNORMAL HIGH (ref 96–112)
Creatinine, Ser: 2.58 mg/dL — ABNORMAL HIGH (ref 0.50–1.10)
GFR calc Af Amer: 24 mL/min — ABNORMAL LOW (ref >90)
GFR calc non Af Amer: 21 mL/min — ABNORMAL LOW (ref >90)
GLUCOSE: 151 mg/dL — AB (ref 70–99)
POTASSIUM: 3.8 meq/L (ref 3.7–5.3)
SODIUM: 148 meq/L — AB (ref 137–147)
TOTAL PROTEIN: 6.4 g/dL (ref 6.0–8.3)
Total Bilirubin: 1.4 mg/dL — ABNORMAL HIGH (ref 0.3–1.2)

## 2014-06-05 LAB — CBC
HCT: 21.9 % — ABNORMAL LOW (ref 36.0–46.0)
HEMOGLOBIN: 7 g/dL — AB (ref 12.0–15.0)
MCH: 31.7 pg (ref 26.0–34.0)
MCHC: 32 g/dL (ref 30.0–36.0)
MCV: 99.1 fL (ref 78.0–100.0)
Platelets: 28 10*3/uL — CL (ref 150–400)
RBC: 2.21 MIL/uL — ABNORMAL LOW (ref 3.87–5.11)
RDW: 22.7 % — AB (ref 11.5–15.5)
WBC: 7.9 10*3/uL (ref 4.0–10.5)

## 2014-06-05 LAB — IRON AND TIBC
IRON: 71 ug/dL (ref 42–135)
Saturation Ratios: 56 % — ABNORMAL HIGH (ref 20–55)
TIBC: 126 ug/dL — AB (ref 250–470)
UIBC: 55 ug/dL — AB (ref 125–400)

## 2014-06-05 LAB — HEPATITIS PANEL, ACUTE
HCV Ab: NEGATIVE
HEP B C IGM: NONREACTIVE
HEP B S AG: NEGATIVE
Hep A IgM: NONREACTIVE

## 2014-06-05 LAB — GLUCOSE, CAPILLARY
GLUCOSE-CAPILLARY: 109 mg/dL — AB (ref 70–99)
Glucose-Capillary: 125 mg/dL — ABNORMAL HIGH (ref 70–99)
Glucose-Capillary: 131 mg/dL — ABNORMAL HIGH (ref 70–99)
Glucose-Capillary: 112 mg/dL — ABNORMAL HIGH (ref 70–99)

## 2014-06-05 LAB — FERRITIN: FERRITIN: 153 ng/mL (ref 10–291)

## 2014-06-05 LAB — MAGNESIUM: MAGNESIUM: 1.9 mg/dL (ref 1.5–2.5)

## 2014-06-05 LAB — AMMONIA: Ammonia: 28 umol/L (ref 11–60)

## 2014-06-05 MED ORDER — RIFAXIMIN 550 MG PO TABS
550.0000 mg | ORAL_TABLET | Freq: Two times a day (BID) | ORAL | Status: DC
  Administered 2014-06-06 – 2014-06-10 (×8): 550 mg via ORAL
  Filled 2014-06-05 (×12): qty 1

## 2014-06-05 MED ORDER — LACTULOSE 10 GM/15ML PO SOLN
30.0000 g | Freq: Two times a day (BID) | ORAL | Status: DC
  Filled 2014-06-05 (×3): qty 45

## 2014-06-05 MED ORDER — PANTOPRAZOLE SODIUM 40 MG PO PACK
40.0000 mg | PACK | Freq: Two times a day (BID) | ORAL | Status: DC
  Filled 2014-06-05 (×3): qty 20

## 2014-06-05 MED ORDER — POTASSIUM CHLORIDE 20 MEQ/15ML (10%) PO LIQD
20.0000 meq | Freq: Two times a day (BID) | ORAL | Status: DC
  Administered 2014-06-06 (×2): 20 meq via ORAL
  Filled 2014-06-05 (×5): qty 15

## 2014-06-05 NOTE — Progress Notes (Signed)
I have spoken with Mr. Dana Petersen at bedside and we plan to meet tomorrow 7/7 at 1300 pm for goals of care. He is anxious to hear more about possibility of transplant.   Dana ChannelAlicia Jylian Pappalardo, NP Palliative Medicine Team Pager # [phone] (M-F 8a-5p) Team Phone # [phone] (Nights/Weekends)

## 2014-06-05 NOTE — Progress Notes (Signed)
[facility] TEAM 1 - Stepdown/ICU TEAM Progress Note  Bing MatterMelissa B Petersen QMV:[id number] DOB: 1966/04/27 DOA: 05/22/2014 PCP: Nelwyn SalisburyFRY,STEPHEN A, MD  Admit HPI / Brief Narrative: [age] F w/ hx of Hx depression, seizure, HTN, and cirrhosis of unclear etiology. [facility] AM with hematemesis and decreased LOC. [facility] hospitalization for hepatic encephalopathy. Due to inability to protect airway she was intubated. GI was consulted and she was also placed on Octerotide infusion. EGD revealed large esophageal varices (requiring 7 bands) and portal gastropathy. She also underwent large volume paracentesis with 10,000 cc removed. She was eventually extubated and after treatment for encephalopathy she was able to awaken somewhat by 6/28. She was subsequently made a DNR this admission.  HPI/Subjective: Alert and confused but when compared to last week mental status markedly improved  Assessment/Plan:  Cryptogenic cirrhosis:  -Dr. Christella HartiganJacobs following for GI- unable to locate any prior definitive hepatic work up so initiating this admission - she may be a candidate for eventual transplant vs tx to tertiary facility once etiology of hepatic failure determined -husband clarified pt ETOH history: quit > 10 years ago and DID NOT drink daily and primarily consumed only on weekends -Husband requested meeting with the palliative care team concerning short-term/long-term goals of care home health vs SNF vs hospice. -LFTs slowly trending down with TB now <2.0  Ascites  -GI considering add'l low volume paracentesis  -albumin 2.8   Coagulopathy -currently no signs of active bleeding monitor closely   Thrombocytopenia  -Continued severe thrombocytopenia - no evidence currently of bleeding - monitor closely  Esophageal varices and portal gastropathy  -initial dx in 2009 and felt to be 2/2 ETOH but family denies pt with prior alcoholism so ?? NASH vs auto immune?? - GI has started  evaluation 7/6 -MELD score 31 which equals a 52% mortality and Child-Pugh score is 14 which equals a Class C  -currently no signs of bleeding  -GI has documented if can get pt stable that should be considered for transplant list  -due to GIB GI recommend continue Rocephin 1 g daily for SBP prophylaxis   Hepatic encephalopathy  -receiving Rifaximin and Lactulose per tube -ammonia normal on current tx -mentation markedly improved   Dysphagia -seemed primarily due to AMS therefore before re inserting tube will ask SLP to repeat eval since mentation has improved  Acute renal failure/hypernatremia  -Renal following and suspect pre renal etiology exacerbated by admission hypotension (10 L removed last paracentesis) -cont D5W at 75/hr  Upper GI bleed due to esophageal varices  -GI recommends repeat EGD around July 22   Acute respiratory failure with hypoxemia  -primarily intubated for airway protection  -CXR 6/29 unremarkable for PNA   History SEIZURE, GRAND MAL  -was due to Alprazolam withdrawal   HYPERTENSION  -controlled off meds   Acute blood loss anemia  -admission hgb was 12 and now hanging in the 7's  DEPRESSIVE DISORDER, RCR, MODERATE  -Continue Prozac 20 mg daily once swallow re eval  GERD   Substance abuse/Nicotine abuse  -has not used ETOH regularly for >10 years- was using prescribed BZDs pre admit   Sore throat  -Continue Cetacaine spray TID PRN  Code Status: DO NOT RESUSCITATE  Family Communication: None   Disposition Plan/Expected LOS: Husband at bedside  Consultants: Dr. Yancey FlemingsJohn Perry (gastroenterology) Dr. Terrial RhodesJoseph Coladonato (nephrology)  Procedure/Significant Events: ETT6/22 >> 6/25, 6/25 >> 6/27  L IJ CVL 6/22 >>  7/1 echocardiogram - Left ventricle: mild  LVH. LVEF= 60%.  - Left atrium: mildly dilated.  Culture UC 6/22 >> negative  Resp 6/22 >> mod staph >> MRSA  Blood 6/22 >> canceled / not drawn  Peritoneal Fluid 6/22 >> rare WBC >>  negative  Antibiotics: Ceftriaxone (SBP px) 6/22 >> d/c 6/28; restarted 7/5>>  DVT prophylaxis: SCD  LINES / TUBES:  6/22 triple-lumen LIJ CVC  Objective: VITAL SIGNS: Blood pressure 137/52, pulse 88, temperature 97.9 F (36.6 C), temperature source Oral, resp. rate 15, height 5\' 4"  (1.626 m), weight 100.699 kg (222 lb), last menstrual period 05/25/2014, SpO2 100.00%.   Intake/Output Summary (Last 24 hours) at 06/05/14 1238 Last data filed at 06/05/14 0800  Gross per 24 hour  Intake   2175 ml  Output   1800 ml  Net    375 ml   Exam: General: Alert, answers questions with nod of head, attempts to speak Lungs: Clear to auscultation bilaterally without wheezes or crackles, RA  Cardiovascular: Regular rate and rhythm without murmur gallop or rub normal S1 and S2 Abdomen: Nontender, distended with ascites, soft, bowel sounds positive, no rebound,no appreciable mass Musculoskeletal: No significant cyanosis, clubbing of bilateral lower extremities, bilateral lower extremity 3+ pedal edema to hips  Data Reviewed: Basic Metabolic Panel:  Recent Labs Lab 05/31/14 1140 06/01/14 0500 06/02/14 0225 06/03/14 0500 06/04/14 0400 06/05/14 0400  NA 158* 159* 155* 154* 151* 148*  K 3.6* 3.1* 3.5* 3.7 3.9 3.8  CL 125* 126* 123* 121* 118* 116*  CO2 17* 18* 19 18* 19 19  GLUCOSE 144* 160* 159* 157* 157* 151*  BUN 29* 27* 30* 36* 41* 44*  CREATININE 3.33* 2.96* 2.92* 2.79* 2.68* 2.58*  CALCIUM 8.5 7.8* 8.2* 8.1* 8.3* 8.1*  MG 1.7  --   --  1.9 1.8 1.9   Liver Function Tests:  Recent Labs Lab 06/01/14 0500 06/02/14 0225 06/03/14 0500 06/04/14 0400 06/05/14 0400  AST 64* 84* 99* 124* 113*  ALT 19 23 27  35 38*  ALKPHOS 214* 297* 374* 424* 431*  BILITOT 1.5* 1.5* 1.4* 1.5* 1.4*  PROT 6.1 6.5 6.5 6.5 6.4  ALBUMIN 3.0* 3.0* 3.0* 2.9* 2.8*   Recent Labs Lab 05/31/14 1140 06/03/14 0500 06/04/14 0400 06/05/14 0415  AMMONIA 25 39 24 28   CBC:  Recent Labs Lab  05/30/14 0500 06/01/14 0500 06/03/14 0500 06/04/14 0400 06/05/14 0400  WBC 5.4 4.3 6.4 8.0 7.9  NEUTROABS 3.4  --   --   --   --   HGB 7.7* 7.4* 7.6* 7.5* 7.0*  HCT 24.8* 23.8* 24.0* 24.4* 21.9*  MCV 100.0 98.3 98.8 98.8 99.1  PLT 42* 31* 25* 26* 28*   CBG:  Recent Labs Lab 06/04/14 1229 06/04/14 1524 06/04/14 2202 06/05/14 0914 06/05/14 1125  GLUCAP 153* 160* 135* 125* 131*   Studies:  Recent x-ray studies have been reviewed in detail by the Attending Physician  Scheduled Meds:  Scheduled Meds: . antiseptic oral rinse  15 mL Mouth Rinse QID  . calamine   Topical TID  . cefTRIAXone (ROCEPHIN)  IV  1 g Intravenous Q24H  . chlorhexidine  15 mL Mouth Rinse BID  . FLUoxetine  20 mg Per Tube Daily  . free water  400 mL Per Tube Q4H  . furosemide  80 mg Intravenous Daily  . insulin aspart  0-15 Units Subcutaneous TID WC  . insulin aspart  0-5 Units Subcutaneous QHS  . lactulose  30 g Per Tube BID  . pantoprazole sodium  40  mg Per Tube BID  . potassium chloride  20 mEq Per Tube BID  . rifaximin  550 mg Per Tube BID   Time spent on care of this patient: 35 mins  ELLIS,ALLISON L. , ANP  [facility] Office  [phone] Pager - [phone]  On-Call/Text Page:      [email or web address]      password [email or web address]  If 7PM-7AM, please contact night-coverage [email or web address] Password [email or web address] 06/05/2014, 12:38 PM   LOS: 14 days   I have personally examined this patient and reviewed the entire database. I have reviewed the above note, made any necessary editorial changes, and agree with its content.  Lonia Blood, MD [facility]

## 2014-06-05 NOTE — Progress Notes (Signed)
Physical Therapy Treatment Patient Details Name: Dana MatterMelissa B Boettcher MRN: [id number] DOB: December 09, 1965 Today's Date: 06/05/2014    History of Present Illness Pt admit with hepatic encephalopathy with ESLD.  VDRF as well as anemia.      PT Comments    Pt admitted with above. Pt currently with functional limitations due to balance and endurance deficits.  Pt will benefit from skilled PT to increase their independence and safety with mobility to allow discharge to the venue listed below.   Follow Up Recommendations  SNF;Supervision/Assistance - 24 hour     Equipment Recommendations  Other (comment) (TBA)    Recommendations for Other Services       Precautions / Restrictions Precautions Precautions: Fall Restrictions Weight Bearing Restrictions: No    Mobility  Bed Mobility Overal bed mobility: Needs Assistance;+2 for physical assistance Bed Mobility: Supine to Sit;Sit to Supine     Supine to sit: Max assist;+2 for physical assistance Sit to supine: Total assist;+2 for physical assistance   General bed mobility comments: Hand-over-hand assist to reach for bed rails. Able to move LE's towards EOB with tactile cueing. Max assist +2 for trunk elevation and heavy assist to maintain sitting balance EOB.   Transfers                 General transfer comment: Pt unable to attempt.  LEs much more swollen and pt unable to extend knee against gravity.  Pt also struggling just to sit EOB (see below).  Ambulation/Gait                 Stairs            Wheelchair Mobility    Modified [facility] (Stroke Patients Only)       Balance Overall balance assessment: Needs assistance;History of Falls Sitting-balance support: Feet supported;Bilateral upper extremity supported Sitting balance-Leahy Scale: Zero Sitting balance - Comments: Needed max to total assist to sit EOB with heavy posterior lean at times. Sat a total of 8 minutes.   Postural control: Posterior lean      Standing balance comment: Unable as pt could not sit EOB without total to max assist.                      Cognition Arousal/Alertness: Awake/alert Behavior During Therapy: Flat affect Overall Cognitive Status: Within Functional Limits for tasks assessed Area of Impairment: Orientation;Attention;Memory;Following commands;Safety/judgement;Awareness;Problem solving Orientation Level: Disoriented to;Place;Time;Situation Current Attention Level: Focused Memory: Decreased short-term memory Following Commands: Follows one step commands inconsistently;Follows one step commands with increased time Safety/Judgement: Decreased awareness of deficits;Decreased awareness of safety   Problem Solving: Slow processing;Decreased initiation;Difficulty sequencing;Requires verbal cues;Requires tactile cues General Comments: Agreed to work with PT but once at EOB, pt looking around and not attending to task.    Exercises General Exercises - Lower Extremity Ankle Circles/Pumps: AROM;Both;5 reps;Supine Heel Slides: AAROM;Both;5 reps;Supine    General Comments General comments (skin integrity, edema, etc.): Bil LEs and belly swollen larger than last week.       Pertinent Vitals/Pain VSS, abdominal pain -not rated    Home Living                      Prior Function            PT Goals (current goals can now be found in the care plan section) Progress towards PT goals: Not progressing toward goals - comment (LEs swollen and pt not as responsive to commands )  Frequency  Min 3X/week    PT Plan Current plan remains appropriate    Co-evaluation             End of Session Equipment Utilized During Treatment: Gait belt Activity Tolerance: Patient limited by fatigue;Patient limited by lethargy Patient left: in bed;with call bell/phone within reach;with nursing/sitter in room     Time: [id number] PT Time Calculation (min): 27 min  Charges:  $Therapeutic Activity: 23-37  mins                    G Codes:      INGOLD,Laderius Valbuena 06/05/2014, 2:47 PM [facility] In[facility],PT Acute Rehabilitation [phone] (pager)

## 2014-06-05 NOTE — Progress Notes (Signed)
[facility] Progress Note    Since last GI note: THis is first time I am meeting her.  She is speaking, answers some questions but clearly still confused, disoriented.  I discussed with her husband in the room.  She has not had anything to drink in many years.    Objective: Vital signs in last 24 hours: Temp:  [97.4 F (36.3 C)-98.6 F (37 C)] 97.4 F (36.3 C) (07/06 0915) Pulse Rate:  [77-86] 77 (07/06 0915) Resp:  [14-16] 14 (07/06 0915) BP: (113-133)/(42-68) 117/60 mmHg (07/06 0915) SpO2:  [100 %] 100 % (07/06 0915) Weight:  [222 lb (100.699 kg)-225 lb (102.059 kg)] 222 lb (100.699 kg) (07/06 0436) Last BM Date: 06/03/14 General: alert and oriented times 2 Heart: regular rate and rythm Abdomen: distended with ascites, non-tender,  Extremities: 2+ edema bilaterally   Lab Results:  Recent Labs  06/03/14 0500 06/04/14 0400 06/05/14 0400  WBC 6.4 8.0 7.9  HGB 7.6* 7.5* 7.0*  PLT 25* 26* 28*  MCV 98.8 98.8 99.1    Recent Labs  06/03/14 0500 06/04/14 0400 06/05/14 0400  NA 154* 151* 148*  K 3.7 3.9 3.8  CL 121* 118* 116*  CO2 18* 19 19  GLUCOSE 157* 157* 151*  BUN 36* 41* 44*  CREATININE 2.79* 2.68* 2.58*  CALCIUM 8.1* 8.3* 8.1*    Recent Labs  06/03/14 0500 06/04/14 0400 06/05/14 0400  PROT 6.5 6.5 6.4  ALBUMIN 3.0* 2.9* 2.8*  AST 99* 124* 113*  ALT 27 35 38*  ALKPHOS 374* 424* 431*  BILITOT 1.4* 1.5* 1.4*    Recent Labs  06/03/14 0500  INR 2.14*    Medications: Scheduled Meds: . antiseptic oral rinse  15 mL Mouth Rinse QID  . calamine   Topical TID  . cefTRIAXone (ROCEPHIN)  IV  1 g Intravenous Q24H  . chlorhexidine  15 mL Mouth Rinse BID  . FLUoxetine  20 mg Per Tube Daily  . free water  400 mL Per Tube Q4H  . furosemide  80 mg Intravenous Daily  . insulin aspart  0-15 Units Subcutaneous TID WC  . insulin aspart  0-5 Units Subcutaneous QHS  . lactulose  30 g Per Tube BID  . pantoprazole sodium  40 mg Per Tube BID  .  potassium chloride  20 mEq Per Tube BID  . rifaximin  550 mg Per Tube BID   Continuous Infusions: . dextrose 75 mL (06/04/14 2039)  . feeding supplement (GLUCERNA 1.2 CAL) 1,000 mL (06/04/14 2034)   PRN Meds:.butamben-tetracaine-benzocaine, ondansetron (ZOFRAN) IV    Assessment/Plan: 48 y.o. female with decompensated cirrhosis.  Etiology of her cirrhosis is not clear.  Viral studies 2009 (Hep B and C) were negative but I do not see other testing (ANA, AMA, iron studies, etc) in EPIC.  She had renal US this admission but not a dedicated liver US.  Paracentesis for diagnostics 1-2 weeks ago showed no SBP, smear showed no atypical cells.    Will sent labs ZOX:[id number]:Hepatitis A (IgM and IgG), Hepatitis B surface antigen, Hepatitis B surface antibody, Hepatitis C antibody, total iron, ferritin, TIBC, ANA, AMA, anti smooth muscle antibody, alpha 1 antitrypsin, ceruloplasm.  Will order abdominal ultrasound (known cirrhosis, screening for hepatoma).    Her Cr is improving, her MS is improving (on lactulose, rifaximin).  Her current UNOS MELD score is 25.  Have to consider discussion with liver transplant center, pending the above workup.     Rachael FeeJacobs, Najir Roop P, MD  06/05/2014, 9:19  AM [facility] Pager [phone]

## 2014-06-05 NOTE — Progress Notes (Signed)
S: Family at bedside. Pt talking today, speech comprehensible. Urine output today- 800mls.  O:BP 117/60  Pulse 77  Temp(Src) 97.4 F (36.3 C) (Oral)  Resp 14  Ht 5\' 4"  (1.626 m)  Wt 222 lb (100.699 kg)  BMI 38.09 kg/m2  SpO2 100%  LMP 05/25/2014  Intake/Output Summary (Last 24 hours) at 06/05/14 1136 Last data filed at 06/05/14 0800  Gross per 24 hour  Intake   2310 ml  Output   1800 ml  Net    510 ml   Intake/Output: I/O last 3 completed shifts: In: 5064 [I.V.:2325; NG/GT:2739] Out: 2125 [Urine:1125; Stool:1000]   Weight change: 222 today-->199 lbs since admission  Vitals noted. Physical Exam-  Gen: Lying in bed, doesn't appear to be in any distress. HEENT: At, [place],  CVS: Regular. Resp: moving equal vols of air, sats 100% on Room air, despite ascites. Abd: Distended, not tender to palpation.  Ext: multiple ecchymosis upper extremities, and pitting pedal edema. Neuro: Alert, responds to questions, speech more appropriate today.  Recent Labs Lab 05/30/14 2200 05/31/14 1140 06/01/14 0500 06/02/14 0225 06/03/14 0500 06/04/14 0400 06/05/14 0400  NA 158* 158* 159* 155* 154* 151* 148*  K 3.1* 3.6* 3.1* 3.5* 3.7 3.9 3.8  CL 123* 125* 126* 123* 121* 118* 116*  CO2 18* 17* 18* 19 18* 19 19  GLUCOSE 149* 144* 160* 159* 157* 157* 151*  BUN 31* 29* 27* 30* 36* 41* 44*  CREATININE 3.43* 3.33* 2.96* 2.92* 2.79* 2.68* 2.58*  ALBUMIN  --  3.3* 3.0* 3.0* 3.0* 2.9* 2.8*  CALCIUM 8.9 8.5 7.8* 8.2* 8.1* 8.3* 8.1*  AST  --  48* 64* 84* 99* 124* 113*  ALT  --  16 19 23 27  35 38*   Liver Function Tests:  Recent Labs Lab 06/03/14 0500 06/04/14 0400 06/05/14 0400  AST 99* 124* 113*  ALT 27 35 38*  ALKPHOS 374* 424* 431*  BILITOT 1.4* 1.5* 1.4*  PROT 6.5 6.5 6.4  ALBUMIN 3.0* 2.9* 2.8*    Recent Labs Lab 06/03/14 0500 06/04/14 0400 06/05/14 0415  AMMONIA 39 24 28   CBC:  Recent Labs Lab 05/30/14 0500 06/01/14 0500 06/03/14 0500 06/04/14 0400  06/05/14 0400  WBC 5.4 4.3 6.4 8.0 7.9  NEUTROABS 3.4  --   --   --   --   HGB 7.7* 7.4* 7.6* 7.5* 7.0*  HCT 24.8* 23.8* 24.0* 24.4* 21.9*  MCV 100.0 98.3 98.8 98.8 99.1  PLT 42* 31* 25* 26* 28*   CBG:  Recent Labs Lab 06/04/14 1229 06/04/14 1524 06/04/14 2202 06/05/14 0914 06/05/14 1125  GLUCAP 153* 160* 135* 125* 131*    Iron Studies: No results found for this basename: IRON, TIBC, TRANSFERRIN, FERRITIN,  in the last 72 hours Studies/Results: No results found. Marland Kitchen. antiseptic oral rinse  15 mL Mouth Rinse QID  . calamine   Topical TID  . cefTRIAXone (ROCEPHIN)  IV  1 g Intravenous Q24H  . chlorhexidine  15 mL Mouth Rinse BID  . FLUoxetine  20 mg Per Tube Daily  . free water  400 mL Per Tube Q4H  . furosemide  80 mg Intravenous Daily  . insulin aspart  0-15 Units Subcutaneous TID WC  . insulin aspart  0-5 Units Subcutaneous QHS  . lactulose  30 g Per Tube BID  . pantoprazole sodium  40 mg Per Tube BID  . potassium chloride  20 mEq Per Tube BID  . rifaximin  550 mg Per Tube BID  BMET    Component Value Date/Time   NA 148* 06/05/2014 0400   NA 138 04/19/2013 1109   K 3.8 06/05/2014 0400   K 3.0* 04/19/2013 1109   CL 116* 06/05/2014 0400   CL 99 04/19/2013 1109   CO2 19 06/05/2014 0400   CO2 27 04/19/2013 1109   GLUCOSE 151* 06/05/2014 0400   GLUCOSE 115* 04/19/2013 1109   BUN 44* 06/05/2014 0400   BUN 11.0 04/19/2013 1109   CREATININE 2.58* 06/05/2014 0400   CREATININE 0.8 04/19/2013 1109   CALCIUM 8.1* 06/05/2014 0400   CALCIUM 7.5* 04/19/2013 1109   GFRNONAA 21* 06/05/2014 0400   GFRAA 24* 06/05/2014 0400   CBC    Component Value Date/Time   WBC 7.9 06/05/2014 0400   WBC 2.9* 04/19/2013 1109   RBC 2.21* 06/05/2014 0400   RBC 3.77 04/19/2013 1109   HGB 7.0* 06/05/2014 0400   HGB 12.3 04/19/2013 1109   HCT 21.9* 06/05/2014 0400   HCT 36.0 04/19/2013 1109   PLT 28* 06/05/2014 0400   PLT 39* 04/19/2013 1109   MCV 99.1 06/05/2014 0400   MCV 95.4 04/19/2013 1109   MCH 31.7 06/05/2014 0400    MCH 32.6 04/19/2013 1109   MCHC 32.0 06/05/2014 0400   MCHC 34.2 04/19/2013 1109   RDW 22.7* 06/05/2014 0400   RDW 15.8* 04/19/2013 1109   LYMPHSABS 1.2 05/30/2014 0500   LYMPHSABS 0.5* 04/19/2013 1109   MONOABS 0.4 05/30/2014 0500   MONOABS 0.2 04/19/2013 1109   EOSABS 0.3 05/30/2014 0500   EOSABS 0.1 04/19/2013 1109   BASOSABS 0.1 05/30/2014 0500   BASOSABS 0.0 04/19/2013 1109    Assessment/Plan:  1. AKI- Etiology likely hepatorenal Vs hemodynamically mediated pre- renal etiology due to intravascular contraction in the setting of massive ascites. Cr trending down- 2.58 today. Urine output- 800mls, down form 1,81475mls 2 days ago. On lasix 80 Iv daily, will continue. Palliative yet to see family.  2. Hypernatremia- 148  today, Panda tube out, will hold for now, also will hold free water 400ml q4h per NGT.    3. Hepatic encephalopathy- Continue lactulose.   4. ABLA- Esophageal varices, on PPI, ceftriaxone, and rifaximin.  Mgmt per GI.   5. 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  Family to discuss with palliative care.   6. Thrombocytopenia- 28 today, stable, multiple ecchymosis. Continue to avoid anticoagulants.   7. Anemia - 7 today, may require transfusion if continues to drop, but pt with fluid overload presently. Likely from ongoing Gi bleed from esophageal varices, with low platelets and chronic illness.   Kennis CarinaEmokpae, Ejiroghene, MD I[facility], PGY-2

## 2014-06-05 NOTE — Progress Notes (Signed)
Daughter accidentally pulled out patients panda tube from right nare. Dr Alvester MorinNewton notified, states ok to leave out for now. No changes noted from previous assessment, will continue to monitor.

## 2014-06-05 NOTE — Progress Notes (Signed)
SLP Cancellation Note  Patient Details Name: Dana Petersen MRN: Bing [id number] DOB: 1966-06-27   Cancelled treatment:       Reason Eval/Treat Not Completed: Patient at procedure or test/unavailable. Pt getting cleaned up, now being taken for a procedure.    Parley Pidcock, Riley NearingBonnie Caroline 06/05/2014, 2:54 PM

## 2014-06-05 NOTE — Progress Notes (Signed)
I have personally seen and examined this patient and agree with the assessment/plan as outlined above by Mariea ClontsEmokpae MD (PGY 2). Renal function continues to show slow improvement-- the etiology appears to be from pre-renal AKI v/s ATN and less likely from HRS. Plans from GI note reviewed with regards to work up and possible management (OLTx consideration). She would be a poor chronic HD candidate and short-term HD only offered if a bridge to OLTx.  Rajinder Mesick K.,MD 06/05/2014 12:24 PM

## 2014-06-06 ENCOUNTER — Inpatient Hospital Stay (HOSPITAL_COMMUNITY): Payer: Medicaid Other

## 2014-06-06 LAB — COMPREHENSIVE METABOLIC PANEL
ALK PHOS: 363 U/L — AB (ref 39–117)
ALT: 33 U/L (ref 0–35)
AST: 97 U/L — AB (ref 0–37)
Albumin: 2.8 g/dL — ABNORMAL LOW (ref 3.5–5.2)
Anion gap: 18 — ABNORMAL HIGH (ref 5–15)
BUN: 46 mg/dL — ABNORMAL HIGH (ref 6–23)
CALCIUM: 8.5 mg/dL (ref 8.4–10.5)
CO2: 15 mEq/L — ABNORMAL LOW (ref 19–32)
Chloride: 111 mEq/L (ref 96–112)
Creatinine, Ser: 2.45 mg/dL — ABNORMAL HIGH (ref 0.50–1.10)
GFR calc Af Amer: 26 mL/min — ABNORMAL LOW (ref >90)
GFR, EST NON AFRICAN AMERICAN: 22 mL/min — AB (ref >90)
GLUCOSE: 120 mg/dL — AB (ref 70–99)
POTASSIUM: 3.8 meq/L (ref 3.7–5.3)
Sodium: 144 mEq/L (ref 137–147)
Total Bilirubin: 1.8 mg/dL — ABNORMAL HIGH (ref 0.3–1.2)
Total Protein: 6.5 g/dL (ref 6.0–8.3)

## 2014-06-06 LAB — AMMONIA: AMMONIA: 35 umol/L (ref 11–60)

## 2014-06-06 LAB — CBC
HCT: 22.8 % — ABNORMAL LOW (ref 36.0–46.0)
Hemoglobin: 7.3 g/dL — ABNORMAL LOW (ref 12.0–15.0)
MCH: 31.6 pg (ref 26.0–34.0)
MCHC: 32 g/dL (ref 30.0–36.0)
MCV: 98.7 fL (ref 78.0–100.0)
Platelets: 55 10*3/uL — ABNORMAL LOW (ref 150–400)
RBC: 2.31 MIL/uL — ABNORMAL LOW (ref 3.87–5.11)
RDW: 23 % — AB (ref 11.5–15.5)
WBC: 8.4 10*3/uL (ref 4.0–10.5)

## 2014-06-06 LAB — GLUCOSE, CAPILLARY
GLUCOSE-CAPILLARY: 109 mg/dL — AB (ref 70–99)
Glucose-Capillary: 126 mg/dL — ABNORMAL HIGH (ref 70–99)
Glucose-Capillary: 125 mg/dL — ABNORMAL HIGH (ref 70–99)
Glucose-Capillary: 117 mg/dL — ABNORMAL HIGH (ref 70–99)

## 2014-06-06 LAB — PROTIME-INR
INR: 1.73 — ABNORMAL HIGH (ref 0.00–1.49)
Prothrombin Time: 20.3 seconds — ABNORMAL HIGH (ref 11.6–15.2)

## 2014-06-06 LAB — ANA: Anti Nuclear Antibody(ANA): NEGATIVE

## 2014-06-06 MED ORDER — PANTOPRAZOLE SODIUM 40 MG PO PACK
40.0000 mg | PACK | Freq: Every day | ORAL | Status: DC
  Administered 2014-06-06: 40 mg via ORAL
  Filled 2014-06-06 (×2): qty 20

## 2014-06-06 MED ORDER — LACTULOSE 10 GM/15ML PO SOLN
20.0000 g | Freq: Every day | ORAL | Status: DC
  Administered 2014-06-06 – 2014-06-10 (×5): 20 g via ORAL
  Filled 2014-06-06 (×6): qty 30

## 2014-06-06 MED ORDER — LACTULOSE ENEMA
300.0000 mL | Freq: Every day | ORAL | Status: DC
  Filled 2014-06-06 (×5): qty 300

## 2014-06-06 NOTE — Progress Notes (Signed)
[place] TEAM 1 - Stepdown/ICU TEAM Progress Note  Bing MatterMelissa B Birchard ZOX:[id number] DOB: [birth year] DOA: 05/22/2014 PCP: Nelwyn SalisburyFRY,STEPHEN A, MD  Admit HPI / Brief Narrative: [age] F w/ hx of Hx depression, seizure, HTN, and cirrhosis of unclear etiology. [facility] 6/22 AM with hematemesis and decreased LOC. [facility] hospitalization for hepatic encephalopathy. Due to inability to protect airway she was intubated. GI was consulted and she was also placed on Octerotide infusion. EGD revealed large esophageal varices (requiring 7 bands) and portal gastropathy. She also underwent large volume paracentesis with 10,000 cc removed. She was eventually extubated and after treatment for encephalopathy she was able to awaken somewhat by 6/28. She was subsequently made a DNR this admission.  HPI/Subjective: Awake but non verbal today with limited eye contact  Assessment/Plan:  Cryptogenic cirrhosis:  -Dr. Christella HartiganJacobs following for GI- unable to locate any prior definitive hepatic work up so initiating this admission - she may be a candidate for eventual transplant vs tx to tertiary facility once etiology of hepatic failure determined -husband clarified pt ETOH history: quit > 10 years ago and DID NOT drink daily and primarily consumed only on weekends -Husband requested meeting with the palliative care team concerning short-term/long-term goals of care home health vs SNF vs hospice. -LFTs slowly trending down with TB now <2.0  Ascites  -GI considering add'l low volume paracentesis  -albumin 2.8   Coagulopathy -currently no signs of active bleeding monitor closely   Thrombocytopenia  -Continued severe thrombocytopenia - no evidence currently of bleeding - monitor closely  Esophageal varices and portal gastropathy  -initial dx in 2009 and felt to be 2/2 ETOH but family denies pt with prior alcoholism so ?? NASH vs auto immune?? - GI has started evaluation 7/6 -MELD score 31  which equals a 52% mortality and Child-Pugh score is 14 which equals a Class C  -currently no signs of bleeding  -GI has documented if can get pt stable that should be considered for transplant list  -due to GIB GI recommend continue Rocephin 1 g daily for SBP prophylaxis   Hepatic encephalopathy  -cont Rifaximin and Lactulose  -ammonia normal on current tx -mentation improved but labile  Dysphagia -SLP eval with MBSS- rec D1 diet  Acute renal failure/hypernatremia  -Renal following and suspected pre renal etiology exacerbated by admission hypotension (10 L removed last paracentesis) -Na has normalized-still in positive balance 6L but most of this appears to be edema and ascites -cont Lasix (started by Nephro) -kvo D5W but increase rate if Na increases  Upper GI bleed due to esophageal varices  -GI recommends repeat EGD around July 22   Acute respiratory failure with hypoxemia  -primarily intubated for airway protection  -CXR 6/29 unremarkable for PNA   History SEIZURE, GRAND MAL  -was due to Alprazolam withdrawal   HYPERTENSION  -controlled off meds   Acute blood loss anemia  -admission hgb was 12 and now hanging in the 7's  DEPRESSIVE DISORDER, RCR, MODERATE  -Continue Prozac 20 mg daily once swallow re eval  GERD   Substance abuse/Nicotine abuse  -has not used ETOH regularly for >10 years- was using prescribed BZDs pre admit   Sore throat  -Continue Cetacaine spray TID PRN  Code Status: DO NOT RESUSCITATE  Family Communication: None   Disposition Plan/Expected LOS: Husband at bedside-PT rec SNF  Consultants: Dr. Yancey FlemingsJohn Perry (gastroenterology) Dr. Terrial RhodesJoseph Coladonato (nephrology)  Procedure/Significant Events: ETT6/22 >> 6/25, 6/25 >> 6/27  L IJ  CVL 6/22 >>  7/1 echocardiogram - Left ventricle: mild LVH. LVEF= 60%.  - Left atrium: mildly dilated.  Culture UC 6/22 >> negative  Resp 6/22 >> mod staph >> MRSA  Blood 6/22 >> canceled / not drawn  Peritoneal  Fluid 6/22 >> rare WBC >> negative  Antibiotics: Ceftriaxone (SBP px) 6/22 >> d/c 6/28; restarted 7/5>>  DVT prophylaxis: SCD  LINES / TUBES:  6/22 triple-lumen LIJ CVC  Objective: VITAL SIGNS: Blood pressure 117/62, pulse 75, temperature 97.8 F (36.6 C), temperature source Oral, resp. rate 14, height 5\' 4"  (1.626 m), weight 217 lb (98.431 kg), last menstrual period 05/25/2014, SpO2 100.00%.   Intake/Output Summary (Last 24 hours) at 06/06/14 1238 Last data filed at 06/06/14 0500  Gross per 24 hour  Intake    750 ml  Output    675 ml  Net     75 ml   Exam: General: Awake but non verbal today Lungs: Clear to auscultation bilaterally without wheezes or crackles, RA  Cardiovascular: Regular rate and rhythm without murmur gallop or rub normal S1 and S2 Abdomen: Nontender, distended with ascites, soft, bowel sounds positive, no rebound,no appreciable mass Musculoskeletal: No significant cyanosis, clubbing of bilateral lower extremities, bilateral lower extremity 3+ pedal edema to hips  Data Reviewed: Basic Metabolic Panel:  Recent Labs Lab 05/31/14 1140  06/02/14 0225 06/03/14 0500 06/04/14 0400 06/05/14 0400 06/06/14 0400  NA 158*  < > 155* 154* 151* 148* 144  K 3.6*  < > 3.5* 3.7 3.9 3.8 3.8  CL 125*  < > 123* 121* 118* 116* 111  CO2 17*  < > 19 18* 19 19 15*  GLUCOSE 144*  < > 159* 157* 157* 151* 120*  BUN 29*  < > 30* 36* 41* 44* 46*  CREATININE 3.33*  < > 2.92* 2.79* 2.68* 2.58* 2.45*  CALCIUM 8.5  < > 8.2* 8.1* 8.3* 8.1* 8.5  MG 1.7  --   --  1.9 1.8 1.9  --   < > = values in this interval not displayed. Liver Function Tests:  Recent Labs Lab 06/02/14 0225 06/03/14 0500 06/04/14 0400 06/05/14 0400 06/06/14 0400  AST 84* 99* 124* 113* 97*  ALT 23 27 35 38* 33  ALKPHOS 297* 374* 424* 431* 363*  BILITOT 1.5* 1.4* 1.5* 1.4* 1.8*  PROT 6.5 6.5 6.5 6.4 6.5  ALBUMIN 3.0* 3.0* 2.9* 2.8* 2.8*    Recent Labs Lab 05/31/14 1140 06/03/14 0500  06/04/14 0400 06/05/14 0415 06/06/14 0400  AMMONIA 25 39 24 28 35   CBC:  Recent Labs Lab 06/01/14 0500 06/03/14 0500 06/04/14 0400 06/05/14 0400 06/06/14 0400  WBC 4.3 6.4 8.0 7.9 8.4  HGB 7.4* 7.6* 7.5* 7.0* 7.3*  HCT 23.8* 24.0* 24.4* 21.9* 22.8*  MCV 98.3 98.8 98.8 99.1 98.7  PLT 31* 25* 26* 28* 55*   CBG:  Recent Labs Lab 06/05/14 1125 06/05/14 1720 06/05/14 2118 06/06/14 0757 06/06/14 1203  GLUCAP 131* 112* 109* 126* 125*   Studies:  Recent x-ray studies have been reviewed in detail by the Attending Physician  Scheduled Meds:  Scheduled Meds: . antiseptic oral rinse  15 mL Mouth Rinse QID  . calamine   Topical TID  . cefTRIAXone (ROCEPHIN)  IV  1 g Intravenous Q24H  . chlorhexidine  15 mL Mouth Rinse BID  . furosemide  80 mg Intravenous Daily  . insulin aspart  0-15 Units Subcutaneous TID WC  . insulin aspart  0-5 Units Subcutaneous QHS  .  lactulose  300 mL Rectal Daily   Or  . lactulose  20 g Oral Daily  . pantoprazole sodium  40 mg Oral Daily  . potassium chloride  20 mEq Oral BID  . rifaximin  550 mg Oral BID   Time spent on care of this patient: 35 mins  ELLIS,ALLISON L. , ANP  [facility] Office  [phone] Pager - [phone]  On-Call/Text Page:      [email or web address]      password [email or web address]  If 7PM-7AM, please contact night-coverage [email or web address] Password [email or web address] 06/06/2014, 12:38 PM   LOS: 15 days     Examined patient and discussed assessment and plan with ANP Revonda Standard agree with the above plan Discussed plan with patient, and answered all questions. Patient with complex multiple medical problems direct patient care> 35 minutes

## 2014-06-06 NOTE — Progress Notes (Signed)
I have personally seen and examined this patient and agree with the assessment/plan as outlined above by Mariea ClontsEmokpae MD (PGY2). Slow renal recovery- await swallow evaluation from today. Continue D5W and will plan to start HCO3 once swallowing cleared Jaaziah Schulke K.,MD 06/06/2014 10:02 AM

## 2014-06-06 NOTE — Evaluation (Signed)
Clinical/Bedside Swallow Evaluation Patient Details  Name: Dana Petersen MRN: [id number] Date of Birth: 09/04/1966  Today's Date: 06/06/2014 Time: [id number] SLP Time Calculation (min): 18 min  Past Medical History:  Past Medical History  Diagnosis Date  . ABNORMAL FINDINGS GI TRACT 09/12/2008  . ABNORMAL TRANSAMINASE-LFT'S 09/12/2008  . Acute bronchitis 07/21/2008  . Cirrhosis of liver without mention of alcohol 08/11/2008    sees Dr. Yancey FlemingsJohn Petersen  . DEPRESSIVE DISORDER, RCR, MODERATE 07/09/2007  . DIABETES MELLITUS, TYPE II 07/09/2007  . GERD 09/07/2008  . Headache(784.0) 09/24/2010  . HIATAL HERNIA 09/07/2008  . HYPERTENSION 07/09/2007  . LOW BACK PAIN 07/02/2010  . Overweight(278.02) 10/01/2007  . 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 Seizures   . Anemia   . Pancytopenia     sees Dr. Eli HoseFiras Petersen   . Thrombocytopenia, unspecified 04/19/2013  . Anemia, unspecified 04/19/2013   Past Surgical History:  Past Surgical History  Procedure Laterality Date  . Tubal ligation    . Carpal tunnel release    . Lumbar laminectomy      L5-S1  dr. Marianne Petersen  . Esophagogastroduodenoscopy N/A 05/22/2014    Procedure: ESOPHAGOGASTRODUODENOSCOPY (EGD);  Surgeon: Theda BelfastPatrick D Hung, MD;  [facility];  Service: Endoscopy;  Laterality: N/A;  bedside    HPI:  [age] WF PMHx depression, seizure, HTN, cirrhosis of unclear etiology although does have prior history of polysubstance abuse including alcohol. [facility] 6/22 AM with hematemesis and decreased LOC. [facility] hepatic encephal[facility]. Due to inability to protect airway she was intubated. GI was consulted and she was also placed on Octerotide infusion. EGD revealed large esophageal varices (requiring 7 bands) and portal gastropathy. She also underwent large volume paracentesis with 10,000 cc removed. She was eventually extubated and after treatment for encephalopathy she was able to awaken somewhat by  6/28. Pt evalauted on 6/30 and 7/1, recommended NPO by SLP. Orders then D/C/d by GI PA. Pts Panda came out and SLP reordered.    Assessment / Plan / Recommendation Clinical Impression  Pt. demonstrated suspected delayed swallow initiation and decreased laryngeal elevation with consistencies.  MBS recommended in light of clinical observations, decreased endurance, intubation 6 days and cognitive deficits.  MBS scheduled for today at 0930.    Aspiration Risk  Severe    Diet Recommendation NPO;Ice chips PRN after oral care   Medication Administration: Via alternative means    Other  Recommendations Recommended Consults: MBS Oral Care Recommendations: Oral care BID   Follow Up Recommendations  Skilled Nursing facility    Frequency and Duration        Pertinent Vitals/Pain WDL         Swallow Study         Oral/Motor/Sensory Function Overall Oral Motor/Sensory Function: Impaired (generalized labial/lingual weakness)   Ice Chips Ice chips: Impaired Presentation: Spoon Oral Phase Impairments: Reduced lingual movement/coordination;Reduced labial seal;Impaired anterior to posterior transit Oral Phase Functional Implications: Prolonged oral transit Pharyngeal Phase Impairments: Suspected delayed Swallow;Decreased hyoid-laryngeal movement   Thin Liquid Thin Liquid: Impaired Presentation: Spoon Oral Phase Impairments: Reduced lingual movement/coordination;Impaired anterior to posterior transit Oral Phase Functional Implications: Prolonged oral transit Pharyngeal  Phase Impairments: Suspected delayed Swallow;Decreased hyoid-laryngeal movement    Nectar Thick Nectar Thick Liquid: Not tested   Honey Thick Honey Thick Liquid: Not tested   Puree Puree: Impaired Presentation: Spoon Oral Phase Impairments: Reduced lingual movement/coordination Pharyngeal Phase Impairments: Suspected delayed Swallow;Decreased hyoid-laryngeal movement   Solid  GO    Solid: Not tested       Dana MacadamiaLisa  Willis [name] M.Ed [employer]SLP Pager [phone]  06/06/2014

## 2014-06-06 NOTE — Progress Notes (Addendum)
Daily Rounding Note  06/06/2014, 8:54 AM  LOS: 15 days   SUBJECTIVE:       Remains in stepdown, stable.  Approved for limited pos.  For MBS today.  Watery stools into flexiseal continue.   OBJECTIVE:         Vital signs in last 24 hours:    Temp:  [97.4 F (36.3 C)-98.2 F (36.8 C)] 97.7 F (36.5 C) (07/07 0429) Pulse Rate:  [74-96] 88 (07/07 0429) Resp:  [12-18] 16 (07/07 0429) BP: (100-138)/(47-87) 119/87 mmHg (07/07 0429) SpO2:  [97 %-100 %] 98 % (07/07 0429) Weight:  [98.431 kg (217 lb)] 98.431 kg (217 lb) (07/07 0625) Last BM Date: 06/05/14 (flexiseal) General: eyes open not consistently responding to visitors   Heart: RRR Chest: clear bil.  breathing quietly Abdomen: still tense, not tender, seems less distended  Extremities: LE edema, anasarca improved Neuro/Psych:  Not following commands.   Intake/Output from previous day: 07/06 0701 - 07/07 0700 In: 750 [I.V.:750] Out: 675 [Urine:675]  Intake/Output this shift:    Lab Results:  Recent Labs  06/04/14 0400 06/05/14 0400 06/06/14 0400  WBC 8.0 7.9 8.4  HGB 7.5* 7.0* 7.3*  HCT 24.4* 21.9* 22.8*  PLT 26* 28* 55*   BMET  Recent Labs  06/04/14 0400 06/05/14 0400 06/06/14 0400  NA 151* 148* 144  K 3.9 3.8 3.8  CL 118* 116* 111  CO2 19 19 15*  GLUCOSE 157* 151* 120*  BUN 41* 44* 46*  CREATININE 2.68* 2.58* 2.45*  CALCIUM 8.3* 8.1* 8.5   LFT  Recent Labs  06/04/14 0400 06/05/14 0400 06/06/14 0400  PROT 6.5 6.4 6.5  ALBUMIN 2.9* 2.8* 2.8*  AST 124* 113* 97*  ALT 35 38* 33  ALKPHOS 424* 431* 363*  BILITOT 1.5* 1.4* 1.8*   PT/INR  Recent Labs  06/06/14 0400  LABPROT 20.3*  INR 1.73*   Hepatitis Panel  Recent Labs  06/05/14 1315  HEPBSAG NEGATIVE  HCVAB NEGATIVE  HEPAIGM NON REACTIVE  HEPBIGM NON REACTIVE    Studies/Results: US Abdomen Complete 06/05/2014   CLINICAL DATA:  Cirrhosis.  EXAM: ULTRASOUND ABDOMEN  COMPLETE  COMPARISON:  CT 07/27/2008.  FINDINGS: Gallbladder:  Gallstones. Gallbladder wall is thickened to 5 mm. This could be from cholecystitis or hypoproteinemic state. Negative Murphy sign.  Common bile duct:  Diameter: 4.3 mm.  Liver:  Irregular hepatic contour consistent with patient's known cirrhosis.  IVC:  No abnormality visualized.  Pancreas:  Poorly visualized.  Spleen:  Splenomegaly 17.2 cm.  Right Kidney:  Length: Left 0.7 cm. Right kidney is echogenic. No hydronephrosis .  Left Kidney:  Length: 10.1 cm.  Left kidney is echogenic.  No hydronephrosis.  Abdominal aorta:  No aneurysm visualized.  Other findings:  Ascites.  IMPRESSION: 1. Cirrhosis with moderate ascites. 2. Severe splenomegaly. 3. Chronic medical renal disease. 4. Gallstones. There is gallbladder wall thickening to 5 mm. This could be from hypoproteinemic state or cholecystitis.   Electronically Signed   By: Marcello Moores  Register   On: 06/05/2014 16:17   Scheduled Meds: . antiseptic oral rinse  15 mL Mouth Rinse QID  . calamine   Topical TID  . cefTRIAXone (ROCEPHIN)  IV  1 g Intravenous Q24H  . chlorhexidine  15 mL Mouth Rinse BID  . furosemide  80 mg Intravenous Daily  . insulin aspart  0-15 Units Subcutaneous TID WC  . insulin aspart  0-5 Units Subcutaneous QHS  . lactulose  30 g Oral BID  . pantoprazole sodium  40 mg Oral BID  . potassium chloride  20 mEq Oral BID  . rifaximin  550 mg Oral BID   Continuous Infusions: . 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  Diagnostic/etiology labs in progress:  automimmune markers, Ceruloplasmin.  Hepatitis serologies negative, ferritin not elevated enough to rule in hemachromatosis.  * Renal failure. Urine output improved with increase in free water boluses. These on hold as NGT out.  * Hypernatremia, resolved * Thrombocytopenia.   Improved.  * Coagulopathy. Improved since 7/1.  * Encephalopathy. On Lactulose and Rocephin.  * Variceal bleed. EGD 6/22: banding of large varices.Completed Octreotide drip. On Protonix  Continues on Rocephin.  * Ascites. Paracentesis at bedside 6/23: no SBP.  Wt down 4 kg in lst 2 days.  * Normocytic  *  Splenomegaly.    PLAN   *  ? transfer to non-tele floor?   *  If fails swallow, or if passes and having suboptimal po intake, would replace feeding tube.  She is malnourished and needs calories/protein * Continue PT/OT therapies. * once the above labs resulted, we can contact liver transplant center to determine if they woold accept her in transfer, chances are they will want her to get through current hospitalization and be seen in their office.  *  Getting to time she will soon need repeat EGD/banding.  Will determine schedule for this.      Dana Petersen  06/06/2014, 8:54 AM Pager: [phone]   ________________________________________________________________________  Velora Heckler GI MD note:  I personally examined the patient, reviewed the data and agree with the assessment and plan described above.  She is 48 and has decompensated cirrhosis.  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  UNOS MELD is 23 today.  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  She is very debilitated, cannot feed self.  She is getting swallow evaluation today which I expect she will fail and she will therefore need replacement of DH feeding tube.  I will discuss case with nearby transplant team after more information (etiology workup) is back. I encouraged her husband to still meet with palliative care team today.  Owens Loffler, MD [facility] Pager [phone]

## 2014-06-06 NOTE — Progress Notes (Addendum)
S: Pt appears to be sleeping, but arousable. Spouse says some of her speech is appropriate but sometimes confused. ALso pt has not been sleeping.  O:BP 120/58  Pulse 82  Temp(Src) 97.7 F (36.5 C) (Oral)  Resp 12  Ht 5\' 4"  (1.626 m)  Wt 217 lb (98.431 kg)  BMI 37.23 kg/m2  SpO2 100%  LMP 05/25/2014  Intake/Output Summary (Last 24 hours) at 06/06/14 0910 Last data filed at 06/06/14 0500  Gross per 24 hour  Intake    750 ml  Output    675 ml  Net     75 ml   Intake/Output: I/O last 3 completed shifts: In: 2115 [I.V.:1575; NG/GT:540] Out: 975 [Urine:975]   Weight change: -8 lb (-3.629 kg)222 today-->199 lbs since admission  Vitals noted. Physical Exam-  Gen: Lying in bed, doesn't appear to be in any distress, eyes closed, not obeying commands. HEENT: At, [place].  CVS: Regular. Resp: moving equal vols of air, sats 100% on Room air, despite ascites. Abd: Marked Distension, not tender to palpation.  Ext: multiple ecchymosis upper extremities, and pitting pedal edema. Neuro: Alert, responds to questions, speech more appropriate today.  Recent Labs Lab 05/31/14 1140 06/01/14 0500 06/02/14 0225 06/03/14 0500 06/04/14 0400 06/05/14 0400 06/06/14 0400  NA 158* 159* 155* 154* 151* 148* 144  K 3.6* 3.1* 3.5* 3.7 3.9 3.8 3.8  CL 125* 126* 123* 121* 118* 116* 111  CO2 17* 18* 19 18* 19 19 15*  GLUCOSE 144* 160* 159* 157* 157* 151* 120*  BUN 29* 27* 30* 36* 41* 44* 46*  CREATININE 3.33* 2.96* 2.92* 2.79* 2.68* 2.58* 2.45*  ALBUMIN 3.3* 3.0* 3.0* 3.0* 2.9* 2.8* 2.8*  CALCIUM 8.5 7.8* 8.2* 8.1* 8.3* 8.1* 8.5  AST 48* 64* 84* 99* 124* 113* 97*  ALT 16 19 23 27  35 38* 33   Liver Function Tests:  Recent Labs Lab 06/04/14 0400 06/05/14 0400 06/06/14 0400  AST 124* 113* 97*  ALT 35 38* 33  ALKPHOS 424* 431* 363*  BILITOT 1.5* 1.4* 1.8*  PROT 6.5 6.4 6.5  ALBUMIN 2.9* 2.8* 2.8*    Recent Labs Lab 06/04/14 0400 06/05/14 0415 06/06/14 0400  AMMONIA 24 28 35    CBC:  Recent Labs Lab 06/01/14 0500 06/03/14 0500 06/04/14 0400 06/05/14 0400 06/06/14 0400  WBC 4.3 6.4 8.0 7.9 8.4  HGB 7.4* 7.6* 7.5* 7.0* 7.3*  HCT 23.8* 24.0* 24.4* 21.9* 22.8*  MCV 98.3 98.8 98.8 99.1 98.7  PLT 31* 25* 26* 28* 55*   CBG:  Recent Labs Lab 06/05/14 0914 06/05/14 1125 06/05/14 1720 06/05/14 2118 06/06/14 0757  GLUCAP 125* 131* 112* 109* 126*    Iron Studies:   Recent Labs  06/05/14 1315  IRON 71  TIBC 126*  FERRITIN 153   Studies/Results: [place] Abdomen Complete  06/05/2014   CLINICAL DATA:  Cirrhosis.  EXAM: ULTRASOUND ABDOMEN COMPLETE  COMPARISON:  CT 07/27/2008.  FINDINGS: Gallbladder:  Gallstones. Gallbladder wall is thickened to 5 mm. This could be from cholecystitis or hypoproteinemic state. Negative Murphy sign.  Common bile duct:  Diameter: 4.3 mm.  Liver:  Irregular hepatic contour consistent with patient's known cirrhosis.  IVC:  No abnormality visualized.  Pancreas:  Poorly visualized.  Spleen:  Splenomegaly 17.2 cm.  Right Kidney:  Length: Left 0.7 cm. Right kidney is echogenic. No hydronephrosis .  Left Kidney:  Length: 10.1 cm.  Left kidney is echogenic.  No hydronephrosis.  Abdominal aorta:  No aneurysm visualized.  Other findings:  Ascites.  IMPRESSION: 1. Cirrhosis with moderate ascites. 2. Severe splenomegaly. 3. Chronic medical renal disease. 4. Gallstones. There is gallbladder wall thickening to 5 mm. This could be from hypoproteinemic state or cholecystitis.   Electronically Signed   By: Maisie Fushomas  Register   On: 06/05/2014 16:17   . antiseptic oral rinse  15 mL Mouth Rinse QID  . calamine   Topical TID  . cefTRIAXone (ROCEPHIN)  IV  1 g Intravenous Q24H  . chlorhexidine  15 mL Mouth Rinse BID  . furosemide  80 mg Intravenous Daily  . insulin aspart  0-15 Units Subcutaneous TID WC  . insulin aspart  0-5 Units Subcutaneous QHS  . lactulose  30 g Oral BID  . pantoprazole sodium  40 mg Oral BID  . potassium chloride  20 mEq Oral BID   . rifaximin  550 mg Oral BID    BMET    Component Value Date/Time   NA 144 06/06/2014 0400   NA 138 04/19/2013 1109   K 3.8 06/06/2014 0400   K 3.0* 04/19/2013 1109   CL 111 06/06/2014 0400   CL 99 04/19/2013 1109   CO2 15* 06/06/2014 0400   CO2 27 04/19/2013 1109   GLUCOSE 120* 06/06/2014 0400   GLUCOSE 115* 04/19/2013 1109   BUN 46* 06/06/2014 0400   BUN 11.0 04/19/2013 1109   CREATININE 2.45* 06/06/2014 0400   CREATININE 0.8 04/19/2013 1109   CALCIUM 8.5 06/06/2014 0400   CALCIUM 7.5* 04/19/2013 1109   GFRNONAA 22* 06/06/2014 0400   GFRAA 26* 06/06/2014 0400   CBC    Component Value Date/Time   WBC 8.4 06/06/2014 0400   WBC 2.9* 04/19/2013 1109   RBC 2.31* 06/06/2014 0400   RBC 3.77 04/19/2013 1109   HGB 7.3* 06/06/2014 0400   HGB 12.3 04/19/2013 1109   HCT 22.8* 06/06/2014 0400   HCT 36.0 04/19/2013 1109   PLT 55* 06/06/2014 0400   PLT 39* 04/19/2013 1109   MCV 98.7 06/06/2014 0400   MCV 95.4 04/19/2013 1109   MCH 31.6 06/06/2014 0400   MCH 32.6 04/19/2013 1109   MCHC 32.0 06/06/2014 0400   MCHC 34.2 04/19/2013 1109   RDW 23.0* 06/06/2014 0400   RDW 15.8* 04/19/2013 1109   LYMPHSABS 1.2 05/30/2014 0500   LYMPHSABS 0.5* 04/19/2013 1109   MONOABS 0.4 05/30/2014 0500   MONOABS 0.2 04/19/2013 1109   EOSABS 0.3 05/30/2014 0500   EOSABS 0.1 04/19/2013 1109   BASOSABS 0.1 05/30/2014 0500   BASOSABS 0.0 04/19/2013 1109    Assessment/Plan:  1. AKI- Etiology Prerenal AKI Vs ATN Vs HRS. Cr trending down- 2.45 today. Urine output- 675mls today. On lasix 80 Iv daily, will continue. Tube feeds and free water held for now. Palliative to meet with family today.  2. Hypernatremia- Resolved. 144  today, Panda tube out, will hold for now, also will hold free water 400ml q4h per NGT.    3. Hepatic encephalopathy- Continue lactulose. Consider stopping daily ammonia levels.  4. ABLA- Esophageal varices, on PPI, ceftriaxone, and rifaximin.  Mgmt per GI.   5. 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  Family to discuss with palliative care.    6. Thrombocytopenia- 28 today, stable, multiple ecchymosis. Continue to avoid anticoagulants.   7. Anemia - 7.3 today, may require transfusion if continues to drop, but pt with fluid overload presently. Likely from ongoing Gi bleed from esophageal varices, with low platelets and chronic illness.   Kennis CarinaEmokpae, Bonnell Placzek, MD IMTS, PGY-2

## 2014-06-06 NOTE — Procedures (Addendum)
Objective Swallowing Evaluation:  Modified Barium Swallow evaluation Patient Details  Name: Dana Petersen MRN: [id number] Date of Birth: March 13, 1966  Today's Date: 06/06/2014 Time: [id number] SLP Time Calculation (min): 14 min  Past Medical History:  Past Medical History  Diagnosis Date  . ABNORMAL FINDINGS GI TRACT 09/12/2008  . ABNORMAL TRANSAMINASE-LFT'S 09/12/2008  . Acute bronchitis 07/21/2008  . Cirrhosis of liver without mention of alcohol 08/11/2008    sees Dr. Yancey FlemingsJohn Perry  . DEPRESSIVE DISORDER, RCR, MODERATE 07/09/2007  . DIABETES MELLITUS, TYPE II 07/09/2007  . GERD 09/07/2008  . Headache(784.0) 09/24/2010  . HIATAL HERNIA 09/07/2008  . HYPERTENSION 07/09/2007  . LOW BACK PAIN 07/02/2010  . Overweight(278.02) 10/01/2007  . 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 Seizures   . Anemia   . Pancytopenia     sees Dr. Eli HoseFiras Shadad   . Thrombocytopenia, unspecified 04/19/2013  . Anemia, unspecified 04/19/2013   Past Surgical History:  Past Surgical History  Procedure Laterality Date  . Tubal ligation    . Carpal tunnel release    . Lumbar laminectomy      L5-S1  dr. Marianne Sofiaappling  . Esophagogastroduodenoscopy N/A 05/22/2014    Procedure: ESOPHAGOGASTRODUODENOSCOPY (EGD);  Surgeon: Theda BelfastPatrick D Hung, MD;  [facility];  Service: Endoscopy;  Laterality: N/A;  bedside    HPI:  [age] WF PMHx depression, seizure, HTN, cirrhosis of unclear etiology although does have prior history of polysubstance abuse including alcohol. [facility] AM with hematemesis and decreased LOC. [facility] for hepatic encephalopathy. Due to inability to protect airway she was intubated. GI was consulted and she was also placed on Octerotide infusion. EGD revealed large esophageal varices (requiring 7 bands) and portal gastropathy. She also underwent large volume paracentesis with 10,000 cc removed. She was eventually extubated and after treatment for encephalopathy she  was able to awaken somewhat by 6/28. Pt evalauted on 6/30 and 7/1, recommended NPO by SLP. Orders then D/C/d by GI PA. Pts Panda came out and SLP reordered.      Assessment / Plan / Recommendation Clinical Impression  Dysphagia Diagnosis:  (mild-mod oral; mild-mod pharyngeal) Clinical impression: Pt. alert with decreased delayed motor/verbal responses compared to session this morning.  She demonstrated mild-moderate sensory based oropharyngeal dysphagia with weak oral manipulation, delayed transit with mechanical soft texture and pharyngeal swallow initiated at the valleculae.  Straw sips thin barium increased oral volume and velocity resulting in mild silent penetration.  SLP recommends Dys 1 diet texture with thin liquids, no straws, pills whole in applesauce, sit upright  and apprpriate alertness with continued ST.  Wife present during MBS and edcuated with verbal and visual (diagram) information and clinical reasoning for recommendations.    Treatment Recommendation  Therapy as outlined in treatment plan below    Diet Recommendation Dysphagia 1 (Puree);Thin liquid   Liquid Administration via: Straw;No straw Medication Administration: Whole meds with puree Supervision: Staff to assist with self feeding;Full supervision/cueing for compensatory strategies Compensations: Slow rate;Small sips/bites;Check for pocketing Postural Changes and/or Swallow Maneuvers: Seated upright 90 degrees    Other  Recommendations Recommended Consults: MBS Oral Care Recommendations: Oral care BID   Follow Up Recommendations  Skilled Nursing facility    Frequency and Duration min 2x/week  2 weeks   Pertinent Vitals/Pain WDL       Reason for Referral     Oral Phase Oral Preparation/Oral Phase Oral Phase: Impaired Oral - Solids Oral - Mechanical Soft: Weak lingual manipulation;Delayed oral  transit   Pharyngeal Phase Pharyngeal Phase Pharyngeal Phase: Impaired Pharyngeal - Honey Pharyngeal - Honey  Teaspoon: Delayed swallow initiation;Premature spillage to valleculae Pharyngeal - Nectar Pharyngeal - Nectar Teaspoon: Delayed swallow initiation;Premature spillage to valleculae Pharyngeal - Nectar Cup: Delayed swallow initiation;Premature spillage to valleculae Pharyngeal - Thin Pharyngeal - Thin Teaspoon: Delayed swallow initiation;Premature spillage to valleculae Pharyngeal - Thin Cup: Premature spillage to valleculae;Delayed swallow initiation Pharyngeal - Thin Straw: Premature spillage to valleculae;Delayed swallow initiation;Penetration/Aspiration during swallow Penetration/Aspiration details (thin straw): Material enters airway, CONTACTS cords and not ejected out Pharyngeal - Solids Pharyngeal - Puree: Delayed swallow initiation;Premature spillage to valleculae  Cervical Esophageal Phase        Cervical Esophageal Phase Cervical Esophageal Phase: Leonarda SalonWFL         Darrow BussingLisa Willis Clariza Sickman M.Ed [employer]SLP Pager [phone]  06/06/2014

## 2014-06-07 ENCOUNTER — Encounter: Payer: Self-pay | Admitting: Cardiology

## 2014-06-07 DIAGNOSIS — R5381 Other malaise: Secondary | ICD-10-CM

## 2014-06-07 DIAGNOSIS — R5383 Other fatigue: Secondary | ICD-10-CM

## 2014-06-07 DIAGNOSIS — Z515 Encounter for palliative care: Secondary | ICD-10-CM

## 2014-06-07 DIAGNOSIS — R531 Weakness: Secondary | ICD-10-CM

## 2014-06-07 LAB — GLUCOSE, CAPILLARY
GLUCOSE-CAPILLARY: 96 mg/dL (ref 70–99)
Glucose-Capillary: 122 mg/dL — ABNORMAL HIGH (ref 70–99)
Glucose-Capillary: 154 mg/dL — ABNORMAL HIGH (ref 70–99)
Glucose-Capillary: 149 mg/dL — ABNORMAL HIGH (ref 70–99)

## 2014-06-07 LAB — CBC
HCT: 21.5 % — ABNORMAL LOW (ref 36.0–46.0)
HEMOGLOBIN: 6.6 g/dL — AB (ref 12.0–15.0)
MCH: 31.3 pg (ref 26.0–34.0)
MCHC: 30.7 g/dL (ref 30.0–36.0)
MCV: 101.9 fL — AB (ref 78.0–100.0)
Platelets: 39 10*3/uL — ABNORMAL LOW (ref 150–400)
RBC: 2.11 MIL/uL — AB (ref 3.87–5.11)
RDW: 23.3 % — ABNORMAL HIGH (ref 11.5–15.5)
WBC: 5 10*3/uL (ref 4.0–10.5)

## 2014-06-07 LAB — COMPREHENSIVE METABOLIC PANEL
ALT: 31 U/L (ref 0–35)
AST: 76 U/L — AB (ref 0–37)
Albumin: 2.7 g/dL — ABNORMAL LOW (ref 3.5–5.2)
Alkaline Phosphatase: 333 U/L — ABNORMAL HIGH (ref 39–117)
Anion gap: 14 (ref 5–15)
BUN: 49 mg/dL — ABNORMAL HIGH (ref 6–23)
CALCIUM: 8 mg/dL — AB (ref 8.4–10.5)
CO2: 20 meq/L (ref 19–32)
CREATININE: 2.54 mg/dL — AB (ref 0.50–1.10)
Chloride: 111 mEq/L (ref 96–112)
GFR, EST AFRICAN AMERICAN: 25 mL/min — AB (ref >90)
GFR, EST NON AFRICAN AMERICAN: 21 mL/min — AB (ref >90)
Glucose, Bld: 114 mg/dL — ABNORMAL HIGH (ref 70–99)
Potassium: 3.6 mEq/L — ABNORMAL LOW (ref 3.7–5.3)
Sodium: 145 mEq/L (ref 137–147)
TOTAL PROTEIN: 6.3 g/dL (ref 6.0–8.3)
Total Bilirubin: 1.4 mg/dL — ABNORMAL HIGH (ref 0.3–1.2)

## 2014-06-07 LAB — ANTI-SMOOTH MUSCLE ANTIBODY, IGG: F-ACTIN AB IGG: 25 U — AB (ref <20)

## 2014-06-07 LAB — PREPARE RBC (CROSSMATCH)

## 2014-06-07 LAB — ALPHA-1-ANTITRYPSIN: A-1 Antitrypsin, Ser: 144 mg/dL (ref 83–199)

## 2014-06-07 LAB — CERULOPLASMIN: Ceruloplasmin: 18 mg/dL — ABNORMAL LOW (ref 18–53)

## 2014-06-07 LAB — AMMONIA: AMMONIA: 26 umol/L (ref 11–60)

## 2014-06-07 LAB — MITOCHONDRIAL ANTIBODIES: Mitochondrial M2 Ab, IgG: 0.56 (ref <0.91)

## 2014-06-07 MED ORDER — FUROSEMIDE 10 MG/ML IJ SOLN: 80.0000 mg | Freq: Every day | INTRAMUSCULAR | Status: DC

## 2014-06-07 MED ORDER — CHLORHEXIDINE GLUCONATE 0.12 % MT SOLN: 15.0000 mL | Freq: Two times a day (BID) | OROMUCOSAL | Status: DC

## 2014-06-07 MED ORDER — LACTULOSE 10 GM/15ML PO SOLN: 20.0000 g | Freq: Every day | ORAL | Status: DC

## 2014-06-07 MED ORDER — BUTAMBEN-TETRACAINE-BENZOCAINE 2-2-14 % EX AERO: 1.0000 | INHALATION_SPRAY | Freq: Three times a day (TID) | CUTANEOUS | Status: DC | PRN

## 2014-06-07 MED ORDER — INSULIN ASPART 100 UNIT/ML ~~LOC~~ SOLN: SUBCUTANEOUS | Status: DC

## 2014-06-07 MED ORDER — CALAMINE EX LOTN: TOPICAL_LOTION | Freq: Three times a day (TID) | CUTANEOUS | Status: DC

## 2014-06-07 MED ORDER — POTASSIUM CHLORIDE CRYS ER 20 MEQ PO TBCR
20.0000 meq | EXTENDED_RELEASE_TABLET | Freq: Two times a day (BID) | ORAL | Status: DC
  Administered 2014-06-07 – 2014-06-08 (×4): 20 meq via ORAL
  Filled 2014-06-07 (×6): qty 1

## 2014-06-07 MED ORDER — SODIUM BICARBONATE 650 MG PO TABS: 650.0000 mg | ORAL_TABLET | Freq: Two times a day (BID) | ORAL | Status: DC

## 2014-06-07 MED ORDER — GLUCERNA SHAKE PO LIQD
237.0000 mL | Freq: Two times a day (BID) | ORAL | Status: DC
  Administered 2014-06-08: 237 mL via ORAL

## 2014-06-07 MED ORDER — POTASSIUM CHLORIDE CRYS ER 20 MEQ PO TBCR: 20.0000 meq | EXTENDED_RELEASE_TABLET | Freq: Two times a day (BID) | ORAL | Status: DC

## 2014-06-07 MED ORDER — PANTOPRAZOLE SODIUM 40 MG PO TBEC: 40.0000 mg | DELAYED_RELEASE_TABLET | Freq: Every day | ORAL | Status: DC

## 2014-06-07 MED ORDER — DEXTROSE 5 % IV SOLN: INTRAVENOUS | Status: DC

## 2014-06-07 MED ORDER — BOOST / RESOURCE BREEZE PO LIQD
1.0000 | ORAL | Status: DC
  Administered 2014-06-08 – 2014-06-10 (×3): 1 via ORAL

## 2014-06-07 MED ORDER — ONDANSETRON HCL 4 MG/2ML IJ SOLN: 4.0000 mg | Freq: Four times a day (QID) | INTRAMUSCULAR | Status: DC | PRN

## 2014-06-07 MED ORDER — BIOTENE DRY MOUTH MT LIQD: 15.0000 mL | Freq: Four times a day (QID) | OROMUCOSAL | Status: DC

## 2014-06-07 MED ORDER — SODIUM BICARBONATE 650 MG PO TABS
650.0000 mg | ORAL_TABLET | Freq: Two times a day (BID) | ORAL | Status: DC
  Administered 2014-06-07 – 2014-06-10 (×6): 650 mg via ORAL
  Filled 2014-06-07 (×9): qty 1

## 2014-06-07 MED ORDER — RIFAXIMIN 550 MG PO TABS: 550.0000 mg | ORAL_TABLET | Freq: Two times a day (BID) | ORAL | Status: DC

## 2014-06-07 MED ORDER — DEXTROSE 5 % IV SOLN: 1.0000 g | INTRAVENOUS | Status: DC

## 2014-06-07 MED ORDER — PANTOPRAZOLE SODIUM 40 MG PO TBEC
40.0000 mg | DELAYED_RELEASE_TABLET | Freq: Every day | ORAL | Status: DC
  Administered 2014-06-07 – 2014-06-08 (×2): 40 mg via ORAL
  Filled 2014-06-07 (×2): qty 1

## 2014-06-07 NOTE — Progress Notes (Signed)
NUTRITION FOLLOW UP  Intervention:   Provide Glucerna Shakes BID Provide Resource Breeze once daily Provide Magic Cup once daily RD to follow for nutrition care plan  Nutrition Dx:   Inadequate oral intake now related to inability to eat as evidenced by NPO status, discontinued  New Nutrition Dx:   Inadequate oral intake related to medical condition/fluctuating mental status as evidenced by meal completion <25%.   Goal:   Intake to meet >90% of estimated nutrition needs, currently unmet  Monitor:   TF regimen & tolerance, PO diet advancement, weight, labs, I/O's  Assessment:   48 year old female with a PMH of cirrhosis, LA Grade A esophagitis, GERD, and DM who presents to the hospital with hematemesis and decreased LOC.  [facility] for [facility] encephal[facility].  05/22/14 EGD with seven bands placed to large mid to distal esoph varieces (Dr Elnoria HowardHung), portal gastropathy.  Patient was extubated on 6/27.  Transferred to 2C-Stepdown 6/29. S/p bedside swallow evaluation 6/30.  SLP recommending NPO status at this time.  Panda tube placed 6/25 (tip projected over distal stomach).    [name] NG tube has been removed. Pt was assessed by SLP and diet was advanced to Dysphagia 1 with thin liquids on 7/7. Per nursing notes pt has been eating 20-25% of meals. [name] weight has been trending up.  Pt on verbal; shook her head to be willing to try nutrition supplements. Husband states pt is not eating much at all.   Height: Ht Readings from Last 1 Encounters:  05/22/14 5\' 4"  (1.626 m)    Weight Status:   Wt Readings from Last 1 Encounters:  06/06/14 217 lb (98.431 kg)  05/22/14 204 lb 5.9 oz (92.7 kg)  Re-estimated needs:  Kcal: 1600-1800 Protein: 80-90 gm Fluid: 1.6-1.8 L  Skin: no wounds  Diet Order: Dysphagia 1, thin   Intake/Output Summary (Last 24 hours) at 06/07/14 1227 Last data filed at 06/07/14 1115  Gross per 24 hour  Intake 427.33 ml   Output   1000 ml  Net -572.67 ml   Labs:   Recent Labs Lab 06/03/14 0500 06/04/14 0400 06/05/14 0400 06/06/14 0400 06/07/14 0500  NA 154* 151* 148* 144 145  K 3.7 3.9 3.8 3.8 3.6*  CL 121* 118* 116* 111 111  CO2 18* 19 19 15* 20  BUN 36* 41* 44* 46* 49*  CREATININE 2.79* 2.68* 2.58* 2.45* 2.54*  CALCIUM 8.1* 8.3* 8.1* 8.5 8.0*  MG 1.9 1.8 1.9  --   --   GLUCOSE 157* 157* 151* 120* 114*    CBG (last 3)   Recent Labs  06/06/14 2137 06/07/14 0757 06/07/14 1130  GLUCAP 109* 96 122*    Scheduled Meds: . antiseptic oral rinse  15 mL Mouth Rinse QID  . calamine   Topical TID  . cefTRIAXone (ROCEPHIN)  IV  1 g Intravenous Q24H  . chlorhexidine  15 mL Mouth Rinse BID  . furosemide  80 mg Intravenous Daily  . insulin aspart  0-15 Units Subcutaneous TID WC  . insulin aspart  0-5 Units Subcutaneous QHS  . lactulose  300 mL Rectal Daily   Or  . lactulose  20 g Oral Daily  . pantoprazole  40 mg Oral Daily  . potassium chloride  20 mEq Oral BID  . rifaximin  550 mg Oral BID  . sodium bicarbonate  650 mg Oral BID    Continuous Infusions: . dextrose 10 mL/hr at 06/07/14 1219    Dana Petersen  Dana Petersen RD, LDN Inpatient Clinical Dietitian Pager: [phone] After Hours Pager: [phone]

## 2014-06-07 NOTE — Progress Notes (Signed)
[facility] Progress Note    Since last GI note: No significant changes.  Hb drifted to 6.7 without overt bleeding,  She is getting 2 unit blood transfusion today.   Objective: Vital signs in last 24 hours: Temp:  [97.4 F (36.3 C)-98.5 F (36.9 C)] 98.1 F (36.7 C) (07/08 1133) Pulse Rate:  [76-93] 77 (07/08 1133) Resp:  [10-15] 14 (07/08 1133) BP: (115-151)/(50-86) 131/67 mmHg (07/08 1133) SpO2:  [98 %-100 %] 99 % (07/08 1133) Last BM Date: 06/07/14 (flexiseal tube in ) General: alert and oriented times 2 Heart: regular rate and rythm Abdomen: soft, non-tender, non-distended, normal bowel sounds  Lab Results:  Recent Labs  06/05/14 0400 06/06/14 0400 06/07/14 0500  WBC 7.9 8.4 5.0  HGB 7.0* 7.3* 6.6*  PLT 28* 55* 39*  MCV 99.1 98.7 101.9*    Recent Labs  06/05/14 0400 06/06/14 0400 06/07/14 0500  NA 148* 144 145  K 3.8 3.8 3.6*  CL 116* 111 111  CO2 19 15* 20  GLUCOSE 151* 120* 114*  BUN 44* 46* 49*  CREATININE 2.58* 2.45* 2.54*  CALCIUM 8.1* 8.5 8.0*    Recent Labs  06/05/14 0400 06/06/14 0400 06/07/14 0500  PROT 6.4 6.5 6.3  ALBUMIN 2.8* 2.8* 2.7*  AST 113* 97* 76*  ALT 38* 33 31  ALKPHOS 431* 363* 333*  BILITOT 1.4* 1.8* 1.4*    Recent Labs  06/06/14 0400  INR 1.73*     Studies/Results:   Medications: Scheduled Meds: . antiseptic oral rinse  15 mL Mouth Rinse QID  . calamine   Topical TID  . cefTRIAXone (ROCEPHIN)  IV  1 g Intravenous Q24H  . chlorhexidine  15 mL Mouth Rinse BID  . furosemide  80 mg Intravenous Daily  . insulin aspart  0-15 Units Subcutaneous TID WC  . insulin aspart  0-5 Units Subcutaneous QHS  . lactulose  300 mL Rectal Daily   Or  . lactulose  20 g Oral Daily  . pantoprazole  40 mg Oral Daily  . potassium chloride  20 mEq Oral BID  . rifaximin  550 mg Oral BID  . sodium bicarbonate  650 mg Oral BID   Continuous Infusions: . dextrose 10 mL/hr at 06/07/14 1219   PRN  Meds:.butamben-tetracaine-benzocaine, ondansetron (ZOFRAN) IV    Assessment/Plan: 48 y.o. female with decompensated cirrhosis  [facility] and they have agreed to accept her as inpatient transfer to evaluate her in person.  THe family understands that this process can take a few days.  They do hope that if she is not accepted as a viable transplant candidate that [facility].  That sounds reasonable to me.    Rachael FeeJacobs, Daniel P, MD  06/07/2014, 12:27 PM [facility] Pager [phone]

## 2014-06-07 NOTE — Progress Notes (Signed)
S: No complaints today. Family at bedside. Husband feeding pt. Tolerating diet. No coughing or choking. Pt obeys some commands.  O:BP 115/66  Pulse 76  Temp(Src) 97.8 F (36.6 C) (Oral)  Resp 12  Ht 5\' 4"  (1.626 m)  Wt 217 lb (98.431 kg)  BMI 37.23 kg/m2  SpO2 100%  LMP 05/25/2014  Intake/Output Summary (Last 24 hours) at 06/07/14 0909 Last data filed at 06/07/14 0500  Gross per 24 hour  Intake 224.83 ml  Output   1000 ml  Net -775.17 ml   Intake/Output: I/O last 3 completed shifts: In: 974.8 [P.O.:80; I.V.:844.8; IV Piggyback:50] Out: 1200 [Urine:1200]   Weight change: 222 today-->199 lbs since admission  Vitals noted. Physical Exam-  Gen: Lying in bed, doesn't appear to be in any distress, obeys some commands, but not verbalizing today. HEENT: At, [place], moist oral mucosa.  CVS: Regular, no added sounds Resp: moving equal vols of air, sats 100% on Room air, despite ascites. Abd: Marked Distension, not tender to palpation.  Ext: multiple ecchymosis upper extremities, and +2 pitting pedal edema. Neuro: Alert, no significant change from yesterday.  Recent Labs Lab 06/01/14 0500 06/02/14 0225 06/03/14 0500 06/04/14 0400 06/05/14 0400 06/06/14 0400 06/07/14 0500  NA 159* 155* 154* 151* 148* 144 145  K 3.1* 3.5* 3.7 3.9 3.8 3.8 3.6*  CL 126* 123* 121* 118* 116* 111 111  CO2 18* 19 18* 19 19 15* 20  GLUCOSE 160* 159* 157* 157* 151* 120* 114*  BUN 27* 30* 36* 41* 44* 46* 49*  CREATININE 2.96* 2.92* 2.79* 2.68* 2.58* 2.45* 2.54*  ALBUMIN 3.0* 3.0* 3.0* 2.9* 2.8* 2.8* 2.7*  CALCIUM 7.8* 8.2* 8.1* 8.3* 8.1* 8.5 8.0*  AST 64* 84* 99* 124* 113* 97* 76*  ALT 19 23 27  35 38* 33 31   Liver Function Tests:  Recent Labs Lab 06/05/14 0400 06/06/14 0400 06/07/14 0500  AST 113* 97* 76*  ALT 38* 33 31  ALKPHOS 431* 363* 333*  BILITOT 1.4* 1.8* 1.4*  PROT 6.4 6.5 6.3  ALBUMIN 2.8* 2.8* 2.7*    Recent Labs Lab 06/05/14 0415 06/06/14 0400 06/07/14 0500  AMMONIA  28 35 26   CBC:  Recent Labs Lab 06/03/14 0500 06/04/14 0400 06/05/14 0400 06/06/14 0400 06/07/14 0500  WBC 6.4 8.0 7.9 8.4 5.0  HGB 7.6* 7.5* 7.0* 7.3* 6.6*  HCT 24.0* 24.4* 21.9* 22.8* 21.5*  MCV 98.8 98.8 99.1 98.7 101.9*  PLT 25* 26* 28* 55* 39*   CBG:  Recent Labs Lab 06/05/14 2118 06/06/14 0757 06/06/14 1203 06/06/14 1718 06/06/14 2137  GLUCAP 109* 126* 125* 117* 109*    Iron Studies:   Recent Labs  06/05/14 1315  IRON 71  TIBC 126*  FERRITIN 153   Studies/Results: [place] Abdomen Complete  06/05/2014   CLINICAL DATA:  Cirrhosis.  EXAM: ULTRASOUND ABDOMEN COMPLETE  COMPARISON:  CT 07/27/2008.  FINDINGS: Gallbladder:  Gallstones. Gallbladder wall is thickened to 5 mm. This could be from cholecystitis or hypoproteinemic state. Negative Murphy sign.  Common bile duct:  Diameter: 4.3 mm.  Liver:  Irregular hepatic contour consistent with patient's known cirrhosis.  IVC:  No abnormality visualized.  Pancreas:  Poorly visualized.  Spleen:  Splenomegaly 17.2 cm.  Right Kidney:  Length: Left 0.7 cm. Right kidney is echogenic. No hydronephrosis .  Left Kidney:  Length: 10.1 cm.  Left kidney is echogenic.  No hydronephrosis.  Abdominal aorta:  No aneurysm visualized.  Other findings:  Ascites.  IMPRESSION: 1. Cirrhosis with moderate  ascites. 2. Severe splenomegaly. 3. Chronic medical renal disease. 4. Gallstones. There is gallbladder wall thickening to 5 mm. This could be from hypoproteinemic state or cholecystitis.   Electronically Signed   By: Maisie Fus  Register   On: 06/05/2014 16:17   Dg Swallowing Func-speech Pathology  06/06/2014   Royce Macadamia, CCC-SLP     06/06/2014 12:16 PM Objective Swallowing Evaluation:  Modified Barium Swallow  evaluation Patient Details  Name: Dana Petersen MRN: [id number] Date of Birth: [birth year]  Today's Date: 06/06/2014 Time: 7829-5621 SLP Time Calculation (min): 14 min  Past Medical History:  Past Medical History  Diagnosis Date  . ABNORMAL  FINDINGS GI TRACT 09/12/2008  . ABNORMAL TRANSAMINASE-LFT'S 09/12/2008  . Acute bronchitis 07/21/2008  . Cirrhosis of liver without mention of alcohol 08/11/2008    sees Dr. Yancey Flemings  . DEPRESSIVE DISORDER, RCR, MODERATE 07/09/2007  . DIABETES MELLITUS, TYPE II 07/09/2007  . GERD 09/07/2008  . Headache(784.0) 09/24/2010  . HIATAL HERNIA 09/07/2008  . HYPERTENSION 07/09/2007  . LOW BACK PAIN 07/02/2010  . Overweight(278.02) 10/01/2007  . 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 Seizures   . Anemia   . Pancytopenia     sees Dr. Eli Hose   . Thrombocytopenia, unspecified 04/19/2013  . Anemia, unspecified 04/19/2013   Past Surgical History:  Past Surgical History  Procedure Laterality Date  . Tubal ligation    . Carpal tunnel release    . Lumbar laminectomy      L5-S1  dr. Marianne Sofia  . Esophagogastroduodenoscopy N/A 05/22/2014    Procedure: ESOPHAGOGASTRODUODENOSCOPY (EGD);  Surgeon: Theda Belfast, MD;  Location: [facility] ENDOSCOPY;  Service: Endoscopy;   Laterality: N/A;  bedside    HPI:  55 WF PMHx depression, seizure, HTN, cirrhosis of unclear  etiology although does have prior history of polysubstance abuse  including alcohol. Presented to [facility] ED 6/22 AM with hematemesis  and decreased LOC. Recently discharged from [facility] after  hospitalization for hepatic encephalopathy. Due to inability to  protect airway she was intubated. GI was consulted and she was  also placed on Octerotide infusion. 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 She also  underwent large volume paracentesis with 10,000 cc removed. She  was eventually extubated and after treatment for encephalopathy  she was able to awaken somewhat by 6/28. Pt evalauted on 6/30 and  7/1, recommended NPO by SLP. Orders then D/C/d by GI PA. Pts  Panda came out and SLP reordered.      Assessment / Plan / Recommendation Clinical Impression  Dysphagia Diagnosis:  (mild-mod oral; mild-mod pharyngeal) Clinical impression: Pt. alert with decreased delayed   motor/verbal responses compared to session this morning.  She  demonstrated mild-moderate sensory based oropharyngeal dysphagia  with weak oral manipulation, delayed transit with mechanical soft  texture and pharyngeal swallow initiated at the valleculae.   Straw sips thin barium increased oral volume and velocity  resulting in mild silent penetration.  SLP recommends Dys 1 diet  texture with thin liquids, no straws, pills whole in applesauce,  sit upright  and apprpriate alertness with continued ST.  Wife  present during MBS and edcuated with verbal and visual (diagram)  information and clinical reasoning for recommendations.    Treatment Recommendation  Therapy as outlined in treatment plan below    Diet Recommendation Dysphagia 1 (Puree);Thin liquid   Liquid Administration via: Straw;No straw Medication Administration: Whole meds with puree Supervision: Staff to assist with self feeding;Full  supervision/cueing  for compensatory strategies Compensations: Slow rate;Small sips/bites;Check for pocketing Postural Changes and/or Swallow Maneuvers: Seated upright 90  degrees    Other  Recommendations Recommended Consults: MBS Oral Care Recommendations: Oral care BID   Follow Up Recommendations  Skilled Nursing facility    Frequency and Duration min 2x/week  2 weeks   Pertinent Vitals/Pain WDL       Reason for Referral     Oral Phase Oral Preparation/Oral Phase Oral Phase: Impaired Oral - Solids Oral - Mechanical Soft: Weak lingual manipulation;Delayed oral  transit   Pharyngeal Phase Pharyngeal Phase Pharyngeal Phase: Impaired Pharyngeal - Honey Pharyngeal - Honey Teaspoon: Delayed swallow initiation;Premature  spillage to valleculae Pharyngeal - Nectar Pharyngeal - Nectar Teaspoon: Delayed swallow  initiation;Premature spillage to valleculae Pharyngeal - Nectar Cup: Delayed swallow initiation;Premature  spillage to valleculae Pharyngeal - Thin Pharyngeal - Thin Teaspoon: Delayed swallow initiation;Premature  spillage  to valleculae Pharyngeal - Thin Cup: Premature spillage to valleculae;Delayed  swallow initiation Pharyngeal - Thin Straw: Premature spillage to valleculae;Delayed  swallow initiation;Penetration/Aspiration during swallow Penetration/Aspiration details (thin straw): Material enters  airway, CONTACTS cords and not ejected out Pharyngeal - Solids Pharyngeal - Puree: Delayed swallow initiation;Premature spillage  to valleculae  Cervical Esophageal Phase        Cervical Esophageal Phase Cervical Esophageal Phase: Leonarda SalonWFL         Darrow BussingLisa Willis Litaker M.Ed [employer]-SL[employer] Pager [phone]  06/06/2014   . antiseptic oral rinse  15 mL Mouth Rinse QID  . calamine   Topical TID  . cefTRIAXone (ROCEPHIN)  IV  1 g Intravenous Q24H  . chlorhexidine  15 mL Mouth Rinse BID  . furosemide  80 mg Intravenous Daily  . insulin aspart  0-15 Units Subcutaneous TID WC  . insulin aspart  0-5 Units Subcutaneous QHS  . lactulose  300 mL Rectal Daily   Or  . lactulose  20 g Oral Daily  . pantoprazole sodium  40 mg Oral Daily  . potassium chloride  20 mEq Oral BID  . rifaximin  550 mg Oral BID    BMET    Component Value Date/Time   NA 145 06/07/2014 0500   NA 138 04/19/2013 1109   K 3.6* 06/07/2014 0500   K 3.0* 04/19/2013 1109   CL 111 06/07/2014 0500   CL 99 04/19/2013 1109   CO2 20 06/07/2014 0500   CO2 27 04/19/2013 1109   GLUCOSE 114* 06/07/2014 0500   GLUCOSE 115* 04/19/2013 1109   BUN 49* 06/07/2014 0500   BUN 11.0 04/19/2013 1109   CREATININE 2.54* 06/07/2014 0500   CREATININE 0.8 04/19/2013 1109   CALCIUM 8.0* 06/07/2014 0500   CALCIUM 7.5* 04/19/2013 1109   GFRNONAA 21* 06/07/2014 0500   GFRAA 25* 06/07/2014 0500   CBC    Component Value Date/Time   WBC 5.0 06/07/2014 0500   WBC 2.9* 04/19/2013 1109   RBC 2.11* 06/07/2014 0500   RBC 3.77 04/19/2013 1109   HGB 6.6* 06/07/2014 0500   HGB 12.3 04/19/2013 1109   HCT 21.5* 06/07/2014 0500   HCT 36.0 04/19/2013 1109   PLT 39* 06/07/2014 0500   PLT 39* 04/19/2013 1109   MCV 101.9* 06/07/2014 0500    MCV 95.4 04/19/2013 1109   MCH 31.3 06/07/2014 0500   MCH 32.6 04/19/2013 1109   MCHC 30.7 06/07/2014 0500   MCHC 34.2 04/19/2013 1109   RDW 23.3* 06/07/2014 0500   RDW 15.8* 04/19/2013 1109   LYMPHSABS 1.2 05/30/2014 0500   LYMPHSABS 0.5* 04/19/2013  1109   MONOABS 0.4 05/30/2014 0500   MONOABS 0.2 04/19/2013 1109   EOSABS 0.3 05/30/2014 0500   EOSABS 0.1 04/19/2013 1109   BASOSABS 0.1 05/30/2014 0500   BASOSABS 0.0 04/19/2013 1109    Assessment/Plan:  1. AKI- Etiology Prerenal AKI Vs ATN Vs HRS. Cr stable- 2.45 today. Urine output improved today on lasix 80mg  daily- today. Speech eval- dysphagia 1, thin liquid.  2. Metabolic acidosis- Bicarb- 15. Likely due to Starvation ketosis and uremia. Now taking orally. Will correct with oral bicarb- 650 BID, as pt is taking orally, and to reduce worsening fluid overload.  2. Hypernatremia- Resolved. Na  stable at 144. On regular thin liquids.   3. Hepatic encephalopathy- Continue lactulose. Consider stopping daily ammonia levels.  4. ABLA- Esophageal varices, on PPI, ceftriaxone, and rifaximin.  Mgmt per GI. Possible EGD- 7/22. Possible paracentesis.  5. 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  Family to discuss with palliative care.   6. Thrombocytopenia- 39 today, stable, multiple ecchymosis. Continue to avoid anticoagulants.   7. Anemia - 6.6 today today, Likely from ongoing Gi bleed from esophageal varices, with low platelets and also contributing chronic illness. Transfuse per primary.  Kennis Carina, MD IMTS, PGY-2

## 2014-06-07 NOTE — Progress Notes (Signed)
PT Cancellation Note  Patient Details Name: Dana Petersen MRN: [id number] DOB: 20-Jan-1966   Cancelled Treatment:    Reason Eval/Treat Not Completed: Medical issues which prohibited therapy. Pt with Hg of 6.6 this morning, and is currently receiving blood transfusion. RN requesting to hold PT until after blood transfusion is complete. [facility] for [facility]. Will hold therapy today, and if pt does not d/c this afternoon will continue PT tomorrow.    Ruthann CancerHamilton, Dana Petersen 06/07/2014, 1:36 PM  Ruthann CancerLaura Petersen, PT, DPT Acute Rehabilitation Services Pager: [phone]

## 2014-06-07 NOTE — Consult Note (Signed)
Patient SV:[id number] B Neyman      DOB: [birth year]      ESP:[id number]     Consult Note from the Palliative Medicine Team at [place] Requested by: Dr. Sherral Hammers     PCP: Laurey Morale, MD Reason for Consultation: [place] and options    Phone Number:[phone]  Assessment of patients Current state: Ms. Riggenbach is a 48 yo female admitted with hematemesis and decreased level of consciousness. Recently at [facility] for hepatic encephalopathy. EGD reveals esophageal varices and she required intubation. Since extubated and more alert but still confused. Also required large volume paracentesis. PMH significant for cirrhosis of liver, depression, diabetes mellitus type 2, hiatal hernia, seizures, lower back pain s/p lumbar laminectomy L5-S1, hypertension.   I met today with Ms. Yazdani husband, Richardson Landry [phone]), and spoke with son Earley Favor via speaker phone. Family has many questions and concerns over recent talk about transfer to [facility] for work up to see if she is a candidate for liver transplant. Family is concerned because they say they were told in the past that she was not a candidate for transplant. Their main concern is that they transfer her to [facility] and that she is NOT a candidate and then she will be 2 hours away from family in very poor health and condition. They wish for her to transfer back to [facility] if not a candidate. I have spoken with LCSW and CMRN about options and they say that family will have to pay for transport back to [facility] - I am awaiting calls back to see if there are any options to help get her back to [place]. We discussed that transplant is her only option to extend her life and that her life expectancy without transplant is very limited to months. They verbalize that they understand there are no other options to extend her life. They wish to discuss this as a family and would like [place] not to transfer until they have made a final decision (I  told them we would need to know ASAP - they agree to tomorrow morning) if they want to pursue this option. I also discussed the alternative to transplant work up be that we focus on her comfort with the help of hospice to improve her quality of life the best we are able for whatever time she has left. I will follow up tomorrow morning and provide support to Ms. Sterry and her family. If they do not wish for transplant work up we will further discuss comfort care and options.    Goals of Care: 1.  Code Status: DNR   2. Scope of Treatment: Continue all available and offered treatments/interventions at this time (outside DNR).    4. Disposition: To be determined on outcomes.    3. Symptom Management:   1. Weakness: Continue medical management. PT following.  2. Nausea/Vomiting: Ondansetron prn.   4. Psychosocial: Emotional support provided during difficult situation and family struggling to make decisions.    Brief HPI: 48 yo female with cirrhosis faced with end of life decisions vs transplant work up.    ROS: Unable to assess - confused.     PMH:  Past Medical History  Diagnosis Date  . ABNORMAL FINDINGS GI TRACT 09/12/2008  . ABNORMAL TRANSAMINASE-LFT'S 09/12/2008  . Acute bronchitis 07/21/2008  . Cirrhosis of liver without mention of alcohol 08/11/2008    sees Dr. Scarlette Shorts  . DEPRESSIVE DISORDER, RCR, MODERATE 07/09/2007  . DIABETES MELLITUS, TYPE  II 07/09/2007  . GERD 09/07/2008  . Headache(784.0) 09/24/2010  . HIATAL HERNIA 09/07/2008  . HYPERTENSION 07/09/2007  . LOW BACK PAIN 07/02/2010  . Overweight(278.02) 10/01/2007  . 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 Seizures   . Anemia   . Pancytopenia     sees Dr. Zola Button   . Thrombocytopenia, unspecified 04/19/2013  . Anemia, unspecified 04/19/2013     PSH: Past Surgical History  Procedure Laterality Date  . Tubal ligation    . Carpal tunnel release    . Lumbar laminectomy      L5-S1  dr. Sinclair Grooms  .  Esophagogastroduodenoscopy N/A 05/22/2014    Procedure: ESOPHAGOGASTRODUODENOSCOPY (EGD);  Surgeon: Beryle Beams, MD;  Location[facility] ENDOSCOPY;  Service: Endoscopy;  Laterality: N/A;  bedside    I have reviewed the [place] and SH and  If appropriate update it with new information. Allergies  Allergen Reactions  . Aspirin Other (See Comments)    Causes elevated liver enzymes  . Phenergan [Promethazine Hcl] Other (See Comments)    Muscle spasms  . Tylenol [Acetaminophen]     Caused elevated liver enzymes   Scheduled Meds: . antiseptic oral rinse  15 mL Mouth Rinse QID  . calamine   Topical TID  . cefTRIAXone (ROCEPHIN)  IV  1 g Intravenous Q24H  . chlorhexidine  15 mL Mouth Rinse BID  . feeding supplement (GLUCERNA SHAKE)  237 mL Oral BID BM  . [START ON 06/08/2014] feeding supplement (RESOURCE BREEZE)  1 Container Oral Q24H  . furosemide  80 mg Intravenous Daily  . insulin aspart  0-15 Units Subcutaneous TID WC  . insulin aspart  0-5 Units Subcutaneous QHS  . lactulose  300 mL Rectal Daily   Or  . lactulose  20 g Oral Daily  . pantoprazole  40 mg Oral Daily  . potassium chloride  20 mEq Oral BID  . rifaximin  550 mg Oral BID  . sodium bicarbonate  650 mg Oral BID   Continuous Infusions: . dextrose 10 mL/hr at 06/07/14 1219   PRN Meds:.butamben-tetracaine-benzocaine, ondansetron (ZOFRAN) IV    BP 134/62  Pulse 77  Temp(Src) 98.4 F (36.9 C) (Oral)  Resp 14  Ht 5' 4"  (1.626 m)  Wt 98.431 kg (217 lb)  BMI 37.23 kg/m2  SpO2 100%  LMP 05/25/2014   PPS: 30%   Intake/Output Summary (Last 24 hours) at 06/07/14 1436 Last data filed at 06/07/14 1233  Gross per 24 hour  Intake 562.33 ml  Output   1000 ml  Net -437.67 ml   LBM: 06/07/14                        Physical Exam:  General:  NAD, awake, alert HEENT: [place]/AT, moist mucous membranes, no JVD Chest: No labored breathing, symmetric CVS: RRR Abdomen: Soft, NT, distended, flexiseal in place with liquid stool  output Ext: BLE edema 2+, MAE, warm to touch Neuro: Alert, awake, confused, non verbal to me, follows simple commands  Labs: CBC    Component Value Date/Time   WBC 5.0 06/07/2014 0500   WBC 2.9* 04/19/2013 1109   RBC 2.11* 06/07/2014 0500   RBC 3.77 04/19/2013 1109   HGB 6.6* 06/07/2014 0500   HGB 12.3 04/19/2013 1109   HCT 21.5* 06/07/2014 0500   HCT 36.0 04/19/2013 1109   PLT 39* 06/07/2014 0500   PLT 39* 04/19/2013 1109   MCV 101.9* 06/07/2014 0500   MCV 95.4 04/19/2013 1109  MCH 31.3 06/07/2014 0500   MCH 32.6 04/19/2013 1109   MCHC 30.7 06/07/2014 0500   MCHC 34.2 04/19/2013 1109   RDW 23.3* 06/07/2014 0500   RDW 15.8* 04/19/2013 1109   LYMPHSABS 1.2 05/30/2014 0500   LYMPHSABS 0.5* 04/19/2013 1109   MONOABS 0.4 05/30/2014 0500   MONOABS 0.2 04/19/2013 1109   EOSABS 0.3 05/30/2014 0500   EOSABS 0.1 04/19/2013 1109   BASOSABS 0.1 05/30/2014 0500   BASOSABS 0.0 04/19/2013 1109    BMET    Component Value Date/Time   NA 145 06/07/2014 0500   NA 138 04/19/2013 1109   K 3.6* 06/07/2014 0500   K 3.0* 04/19/2013 1109   CL 111 06/07/2014 0500   CL 99 04/19/2013 1109   CO2 20 06/07/2014 0500   CO2 27 04/19/2013 1109   GLUCOSE 114* 06/07/2014 0500   GLUCOSE 115* 04/19/2013 1109   BUN 49* 06/07/2014 0500   BUN 11.0 04/19/2013 1109   CREATININE 2.54* 06/07/2014 0500   CREATININE 0.8 04/19/2013 1109   CALCIUM 8.0* 06/07/2014 0500   CALCIUM 7.5* 04/19/2013 1109   GFRNONAA 21* 06/07/2014 0500   GFRAA 25* 06/07/2014 0500    CMP     Component Value Date/Time   NA 145 06/07/2014 0500   NA 138 04/19/2013 1109   K 3.6* 06/07/2014 0500   K 3.0* 04/19/2013 1109   CL 111 06/07/2014 0500   CL 99 04/19/2013 1109   CO2 20 06/07/2014 0500   CO2 27 04/19/2013 1109   GLUCOSE 114* 06/07/2014 0500   GLUCOSE 115* 04/19/2013 1109   BUN 49* 06/07/2014 0500   BUN 11.0 04/19/2013 1109   CREATININE 2.54* 06/07/2014 0500   CREATININE 0.8 04/19/2013 1109   CALCIUM 8.0* 06/07/2014 0500   CALCIUM 7.5* 04/19/2013 1109   PROT 6.3 06/07/2014 0500   PROT 6.9  04/19/2013 1109   ALBUMIN 2.7* 06/07/2014 0500   ALBUMIN 3.0* 04/19/2013 1109   AST 76* 06/07/2014 0500   AST 52* 04/19/2013 1109   ALT 31 06/07/2014 0500   ALT 32 04/19/2013 1109   ALKPHOS 333* 06/07/2014 0500   ALKPHOS 235* 04/19/2013 1109   BILITOT 1.4* 06/07/2014 0500   BILITOT 2.59* 04/19/2013 1109   GFRNONAA 21* 06/07/2014 0500   GFRAA 25* 06/07/2014 0500      Time In Time Out Total Time Spent with Patient Total Overall Time  1320 1440 45mn 871m    Greater than 50%  of this time was spent counseling and coordinating care related to the above assessment and plan.  AlVinie SillNP Palliative Medicine Team Pager # [phone]-F 8a-5p) Team Phone # [phone]/Weekends)

## 2014-06-07 NOTE — Progress Notes (Signed)
Speech Language Pathology Treatment: Dysphagia  Patient Details Name: Dana Petersen MRN: [id number] DOB: 13-Jun-1966 Today's Date: 06/07/2014 Time: 0911-0920 SLP Time Calculation (min): 9 min  Assessment / Plan / Recommendation Clinical Impression  Dysphagia intervention during breakfast with husband present.  SLP demonstrated how to reposition pt. And obtain more upright position (with assist move trunk to head of bed, use reverse trendelenberg position to facilitate upright position).  Hand over hand tactile feeding assist with importance and clinical reasoning for husband did not reveal  indications of poor laryngeal closure.  Mild oral transit delays.  Continue Dys 1 diet texture and thin liquids with upgrade solids when appropriate.    HPI HPI: [age] WF PMHx depression, seizure, HTN, cirrhosis of unclear etiology although does have prior history of polysubstance abuse including alcohol. [facility]/22 AM with hematemesis and decreased LOC. [facility] for hepatic encephalopathy. Due to inability to protect airway she was intubated. GI was consulted and she was also placed on Octerotide infusion. EGD revealed large esophageal varices (requiring 7 bands) and portal gastropathy. She also underwent large volume paracentesis with 10,000 cc removed. She was eventually extubated and after treatment for encephalopathy she was able to awaken somewhat by 6/28. Pt evalauted on 6/30 and 7/1, recommended NPO by SLP. Orders then D/C/d by GI PA. Pts Panda came out and SLP reordered.    Pertinent Vitals WDL  SLP Plan  Continue with current plan of care    Recommendations Diet recommendations: Dysphagia 1 (puree);Thin liquid Liquids provided via: Cup;No straw Medication Administration: Whole meds with puree Supervision: Staff to assist with self feeding;Full supervision/cueing for compensatory strategies Compensations: Slow rate;Small sips/bites;Check for  pocketing Postural Changes and/or Swallow Maneuvers: Seated upright 90 degrees              Oral Care Recommendations: Oral care BID Follow up Recommendations: Skilled Nursing facility Plan: Continue with current plan of care    GO     Royce MacadamiaLisa Willis Malique Driskill M.Ed [employer]-SLP Pager [phone]  06/07/2014

## 2014-06-07 NOTE — Progress Notes (Signed)
Noted order for midline IV, IV team notified of order.

## 2014-06-07 NOTE — Progress Notes (Signed)
Just received a call from the palliative team. The patient's family is now voicing concern over the patient being [facility] liver [facility]. Apparently earlier during the hospitalization they had been told she would not be a candidate for transplant and now they are confused that now she may be a candidate for transplant. Their primary concern though seems to be the fact that the patient will be so far away from her home geographically and if she is declined transplant that they will have no way to pay for her to be transported back up to the [place] area.  The palliative team was very frank in discussing that the patient's clinical situation is dire and that without transplant that the patient's expected lifespan is less than 1-3 months. She recommended that if the patient's family declines transplant evaluation our next option appears to be focusing on keeping the patient comfortable since at this time we are running out of treatment options and that her current condition is irreversible and although she is stable at this time her overall life span again is quite limited due to the severity of her hepatic failure.  At this time the family wishes to discuss amongst themselves and will let the medical team know on 06/08/2014 what they decide. I have asked the palliative team to determine if the patient is a candidate for residential hospice in the event they decide to not pursue transplant evaluation option.  Junious SilkAllison Reylene Stauder, ANP

## 2014-06-07 NOTE — Discharge Summary (Addendum)
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   Hx of HYPERTENSION now with relative hypotension   Acute blood loss anemia   Acute renal failure 2/2 ATN/pre renal after large volume paracentesis and hypotension   GERD   Nicotine abuse  Discharge Condition: fragile but stable with terminal liver disease   Diet recommendation: Dysphagia 1 with thin liquids, no straws, meds whole with puree  Filed Weights   06/08/14 0452 06/09/14 0451 06/10/14 0500  Weight: 95.709 kg (211 lb) 97.07 kg (214 lb) 96.616 kg (213 lb)    History of present illness/Review of Initial Hospital Course:  19 female with known depression, withdrawal seizures, HTN, and cirrhosis of unclear etiology. Recently discharged from [facility] after hospitalization for hepatic encephalopathy. Presented to [facility] ED 6/22 AM with hematemesis and decreased LOC. Due to inability to protect airway she was intubated after presentation to [facility]. GI was consulted and she was also placed on an Octerotide infusion. EGD revealed large esophageal varices (requiring 7 bands) and portal gastropathy. She also underwent large volume paracentesis with 10,000 cc removed. She was eventually extubated and after treatment for encephalopathy she was able to awaken somewhat by 6/28. She was subsequently made a DNR. She eventually stabilized enough to transfer to the SDU where she remained until time of  discharge.  CODE STATUS: DO NOT RESUSCITATE  Extended Hospital Course:   Cryptogenic cirrhosis:  -Dr. Christella Hartigan followed for GI - unable to locate any prior definitive hepatic work up so initiated this admission  -after chart review etiology for cirrhosis in past felt due to be NASH but due to ?financial concerns the patient had not been able to undergo a formal OP work up for definitive diagnosis -husband clarified pt ETOH history: quit > 10 years ago and DID NOT drink daily and primarily consumed only on weekends - HAD NOT HAD A DRINK IN OVER 10 years  -Dr Christella Hartigan contacted [facility] [phone]) and the Hospitalist service/Dr. Mallie Darting [phone]) accepted the patient in transfer - the Hepatologist was to see the patient in consultation to evaluate for possible liver transplant - after an extensive discussion with the family with the assistance of Palliative Care the decision was made to defer transplant evaluation and focus on comfort measures -midline catheter placed for IV comfort meds/reliable access  Ascites  -underwent large volume paracentesis of 10,000 on 6/22 -albumin 2.7   Coagulopathy  -currently no signs of active bleeding, though Hgb has been slowly drifting downward  Thrombocytopenia  -Continued severe thrombocytopenia - no evidence currently of bleeding - 7/8 platelets 39,000  Upper GI bleed due to Esophageal varices and portal gastropathy  -currently no signs of overt bleeding  -due to GIB continued Rocephin 1 g daily for SBP prophylaxis during hospital stay - stopped at time of d/c   Acute blood loss anemia  -admission hgb was 12 and had been stable around 7 but slowly drifted down over several days -  7/8 hgb down to 6.6 so transfused 1 unit PRBCs -7/10 Hgb 8.1 -no further checks due to transition to comfort directed care   Hepatic encephalopathy  -cont Rifaximin and Lactulose  -ammonia normal on current tx  -mentation improved but labile  -plan to  continue foley and rectal tube for comfort and hygiene as per pt/family request   Dysphagia  -SLP eval with MBS - rec D1 diet - is tolerating but intake poor  Acute renal failure/hypernatremia  -Renal followed and suspected pre renal etiology exacerbated by admission hypotension (10 L removed last paracentesis)  -Na normalized   -cont Lasix (started by Nephro) after dc for comfort w/ empiric KCl as long as pt can tolerate   Acute respiratory failure with hypoxemia  -primarily intubated for airway protection in setting of hematemesis - respiratory status has remained stable since extubated  -CXR 6/29 unremarkable for PNA   History SEIZURE, GRAND MAL  -was due to Alprazolam withdrawal   HYPERTENSION  -controlled off meds   Substance abuse/Nicotine abuse  -has not used ETOH regularly for >10 years - was using prescribed BZDs pre admit   LINES / TUBES:  6/22 triple lumen LIJ CVC - removed 06/08/14 Midline catheter inserted 7/9  Procedures: ETT 6/22 >> 6/25, 6/25 >> 6/27  L IJ CVL 6/22 >> 06/08/2014 7/1 echocardiogram   - Left ventricle: mild LVH. LVEF= 60%.    - Left atrium: mildly dilated.  Cultures:  UC 6/22 >> negative  Resp 6/22 >> mod staph >> MRSA  Peritoneal Fluid 6/22 >> rare WBC >> negative   Antibiotics:  Ceftriaxone (SBP px) 6/22 >> 6/28; restarted 7/5>>7/11  Consultations: Dr. Yancey FlemingsJohn Perry + Dr. Wendall Papaan Jacobs (Gastroenterology)  Dr. Terrial RhodesJoseph Coladonato (Nephrology) Palliative Medicine + Hospice   Discharge Exam: Filed Vitals:   06/10/14 1126  BP: 141/83  Pulse: 93  Temp: 98.2 F (36.8 C)  Resp: 13   General: Awake - non verbal today Lungs: Clear to auscultation bilaterally without wheezes or crackles Cardiovascular: Regular rate and rhythm without murmur gallop or rub  Abdomen: Non tender and without guarding or rebound, distended with ascites, soft but protuberent, bowel sounds positive, no appreciable mass  Musculoskeletal: No significant cyanosis,  clubbing of bilateral lower extremities, bilateral lower extremity 3+ pedal edema to hips    Medication List    STOP taking these medications       albuterol 108 (90 BASE) MCG/ACT inhaler  Commonly known as:  PROVENTIL HFA;VENTOLIN HFA     alprazolam 2 MG tablet  Commonly known as:  XANAX     ibuprofen 200 MG tablet  Commonly known as:  ADVIL,MOTRIN     metFORMIN 1000 MG tablet  Commonly known as:  GLUCOPHAGE     mometasone-formoterol 200-5 MCG/ACT Aero  Commonly known as:  DULERA     Oxycodone HCl 20 MG Tabs     potassium chloride SA 20 MEQ tablet  Commonly known as:  K-DUR,KLOR-CON      TAKE these medications       calamine lotion  Apply topically 3 (three) times daily.     furosemide 80 MG tablet  Commonly known as:  LASIX  Take 1 tablet (80 mg total) by mouth daily.     lactulose 10 GM/15ML solution  Commonly known as:  CHRONULAC  Take 30 mLs (20 g total) by mouth daily.     morphine 2 MG/ML injection  Inject 0.5-1 mLs (1-2 mg total) into the vein every hour as needed.  morphine CONCENTRATE 10 mg / 0.5 ml concentrated solution  Take 0.5 mLs (10 mg total) by mouth every hour as needed for severe pain.     ondansetron 4 MG disintegrating tablet  Commonly known as:  ZOFRAN-ODT  Take 1 tablet (4 mg total) by mouth every 6 (six) hours as needed for nausea or vomiting.     pantoprazole 40 MG tablet  Commonly known as:  PROTONIX  Take 1 tablet (40 mg total) by mouth daily at 12 noon.     potassium chloride 20 MEQ/15ML (10%) solution  Take 22.5 mLs (30 mEq total) by mouth 2 (two) times daily.     promethazine 25 MG tablet  Commonly known as:  PHENERGAN  Take 1 tablet (25 mg total) by mouth every 4 (four) hours as needed for nausea or vomiting.     rifaximin 550 MG Tabs tablet  Commonly known as:  XIFAXAN  Take 1 tablet (550 mg total) by mouth 2 (two) times daily.     sodium bicarbonate 650 MG tablet  Take 1 tablet (650 mg total) by mouth 2 (two) times  daily.     sodium chloride 0.9 % injection  Inject 10 mLs into the vein every 12 (twelve) hours. Use to flush PICC line       Allergies  Allergen Reactions  . Aspirin Other (See Comments)    Causes elevated liver enzymes  . Phenergan [Promethazine Hcl] Other (See Comments)    Muscle spasms  . Tylenol [Acetaminophen]     Caused elevated liver enzymes   Follow-up Information   Follow up with FRY,STEPHEN A, MD. Schedule an appointment as soon as possible for a visit in 1 week.   Specialty:  Family Medicine   Contact information:   [address] [place] [address] [phone]      Significant Diagnostic Studies:  Ct Head Wo Contrast  05/22/2014   IMPRESSION: No acute intracranial process.  Worsening right paranasal sinusitis.   Electronically Signed   By: Awilda Metro   On: 05/22/2014 06:34   US Abdomen Complete  06/05/2014   IMPRESSION: 1. Cirrhosis with moderate ascites. 2. Severe splenomegaly. 3. Chronic medical renal disease. 4. Gallstones. There is gallbladder wall thickening to 5 mm. This could be from hypoproteinemic state or cholecystitis.   Electronically Signed   By: Maisie Fus  Register   On: 06/05/2014 16:17   US Renal Port  05/22/2014    IMPRESSION: No evidence for hydronephrosis.  Moderate volume ascites.   Electronically Signed   By: Kennith Center M.D.   On: 05/22/2014 16:48   Labs: Basic Metabolic Panel:  Recent Labs Lab 06/04/14 0400 06/05/14 0400 06/06/14 0400 06/07/14 0500 06/08/14 0230 06/09/14 0450 06/10/14 0600  NA 151* 148* 144 145 144 147 144  K 3.9 3.8 3.8 3.6* 3.7 2.8* 2.9*  CL 118* 116* 111 111 109 110 107  CO2 19 19 15* 20 17* 19 19  GLUCOSE 157* 151* 120* 114* 139* 114* 102*  BUN 41* 44* 46* 49* 49* 48* 46*  CREATININE 2.68* 2.58* 2.45* 2.54* 2.45* 2.37* 2.23*  CALCIUM 8.3* 8.1* 8.5 8.0* 8.4 8.3* 8.2*  MG 1.8 1.9  --   --   --  1.7  --    Liver Function Tests:  Recent Labs Lab 06/06/14 0400 06/07/14 0500  06/08/14 0230 06/09/14 0450 06/10/14 0600  AST 97* 76* 77* 66* 65*  ALT 33 31 32 30 31  ALKPHOS 363* 333* 346* 326* 312*  BILITOT  1.8* 1.4* 1.7* 1.7* 1.8*  PROT 6.5 6.3 6.6 6.6 6.7  ALBUMIN 2.8* 2.7* 2.8* 2.8* 2.9*    Recent Labs Lab 06/05/14 0415 06/06/14 0400 06/07/14 0500 06/08/14 0230 06/09/14 0450  AMMONIA 28 35 26 38 18   CBC:  Recent Labs Lab 06/05/14 0400 06/06/14 0400 06/07/14 0500 06/08/14 0230 06/09/14 0450  WBC 7.9 8.4 5.0 7.4 5.4  HGB 7.0* 7.3* 6.6* 8.4* 8.1*  HCT 21.9* 22.8* 21.5* 25.8* 24.9*  MCV 99.1 98.7 101.9* 97.0 97.6  PLT 28* 55* 39* 42* 43*   CBG:  Recent Labs Lab 06/09/14 0744 06/09/14 1155 06/09/14 1733 06/09/14 2143 06/10/14 0823  GLUCAP 105* 186* 130* 123* 97   Time spent: >30 minutes  Signed:  Lonia BloodJeffrey T. McClung, MD [facility] [facility] For Consults/Admissions - Flow Manager - [phone] Office  [phone] Pager [phone]  On-Call/Text Page:      [email or web address]      password [email or web address]  06/10/2014, 11:51 AM

## 2014-06-07 NOTE — Progress Notes (Signed)
I have personally seen and examined this patient and agree with the assessment/plan as outlined above by Mariea ClontsEmokpae MD (PGY2). Getting PRBCs for Hgb drop. Renal function stable/unchanged but still higher than baseline. No HD needs at this time. Start PO bicarbonate for metabolic acidosis. Will DC IVFs once PO intake satisfactory. Megon Kalina K.,MD 06/07/2014 9:56 AM

## 2014-06-07 NOTE — Progress Notes (Signed)
Daily Rounding Note  06/07/2014, 12:21 PM  LOS: 16 days   SUBJECTIVE:       Note orders for blood transfusion.  Note results of MBSS Urine output 1 liter yesterday.   OBJECTIVE:         Vital signs in last 24 hours:    Temp:  [97.4 F (36.3 C)-98.5 F (36.9 C)] 98.1 F (36.7 C) (07/08 1133) Pulse Rate:  [76-93] 77 (07/08 1133) Resp:  [10-15] 14 (07/08 1133) BP: (115-151)/(50-86) 131/67 mmHg (07/08 1133) SpO2:  [98 %-100 %] 99 % (07/08 1133) Last BM Date: 06/07/14 (flexiseal tube in ) General: comfortable.  Not verbal for me   Heart: RRR Chest: clear bil.  Abdomen: distended, active BS.  Not tender .  Stool is liquid and light brown in the flexiseal bag: no melena or blood Extremities: anasarca into thighs. Swelling in arms better.  Purpura on arms Neuro/Psych:  Not focusing on me for the most part.  tolerating po's being spooned for pt by husband. Follows some simple commands with repeated prompting.   Intake/Output from previous day: 07/07 0701 - 07/08 0700 In: 224.8 [P.O.:80; I.V.:94.8; IV Piggyback:50] Out: 1000 [Urine:1000]  Intake/Output this shift: Total I/O In: 252.5 [P.O.:240; Blood:12.5] Out: -   Lab Results:  Recent Labs  06/05/14 0400 06/06/14 0400 06/07/14 0500  WBC 7.9 8.4 5.0  HGB 7.0* 7.3* 6.6*  HCT 21.9* 22.8* 21.5*  PLT 28* 55* 39*   BMET  Recent Labs  06/05/14 0400 06/06/14 0400 06/07/14 0500  NA 148* 144 145  K 3.8 3.8 3.6*  CL 116* 111 111  CO2 19 15* 20  GLUCOSE 151* 120* 114*  BUN 44* 46* 49*  CREATININE 2.58* 2.45* 2.54*  CALCIUM 8.1* 8.5 8.0*   LFT  Recent Labs  06/05/14 0400 06/06/14 0400 06/07/14 0500  PROT 6.4 6.5 6.3  ALBUMIN 2.8* 2.8* 2.7*  AST 113* 97* 76*  ALT 38* 33 31  ALKPHOS 431* 363* 333*  BILITOT 1.4* 1.8* 1.4*   PT/INR  Recent Labs  06/06/14 0400  LABPROT 20.3*  INR 1.73*   Hepatitis Panel  Recent Labs  06/05/14 1315  HEPBSAG  NEGATIVE  HCVAB NEGATIVE  HEPAIGM NON REACTIVE  HEPBIGM NON REACTIVE   Ferritin  153 ANA       Negative Still pending: smooth muscle and mitochondrial ABs, Ceruloplasmin, Alpha 1 Antitrypsin. .   Studies/Results: [place] Abdomen Complete 06/05/2014   CLINICAL DATA:  Cirrhosis.  EXAM: ULTRASOUND ABDOMEN COMPLETE  COMPARISON:  CT 07/27/2008.  FINDINGS: Gallbladder:  Gallstones. Gallbladder wall is thickened to 5 mm. This could be from cholecystitis or hypoproteinemic state. Negative Murphy sign.  Common bile duct:  Diameter: 4.3 mm.  Liver:  Irregular hepatic contour consistent with patient's known cirrhosis.  IVC:  No abnormality visualized.  Pancreas:  Poorly visualized.  Spleen:  Splenomegaly 17.2 cm.  Right Kidney:  Length: Left 0.7 cm. Right kidney is echogenic. No hydronephrosis .  Left Kidney:  Length: 10.1 cm.  Left kidney is echogenic.  No hydronephrosis.  Abdominal aorta:  No aneurysm visualized.  Other findings:  Ascites.  IMPRESSION: 1. Cirrhosis with moderate ascites. 2. Severe splenomegaly. 3. Chronic medical renal disease. 4. Gallstones. There is gallbladder wall thickening to 5 mm. This could be from hypoproteinemic state or cholecystitis.   Electronically Signed   By: Maisie Fushomas  Register   On: 06/05/2014 16:17   Dg Swallowing Func-speech Pathology 06/06/2014    Assessment /  Plan / Recommendation Clinical Impression  Dysphagia Diagnosis:  (mild-mod oral; mild-mod pharyngeal) Clinical impression: Pt. alert with decreased delayed  motor/verbal responses compared to session this morning.  She  demonstrated mild-moderate sensory based oropharyngeal dysphagia  with weak oral manipulation, delayed transit with mechanical soft  texture and pharyngeal swallow initiated at the valleculae.   Straw sips thin barium increased oral volume and velocity  resulting in mild silent penetration.  SLP recommends Dys 1 diet  texture with thin liquids, no straws, pills whole in applesauce,  sit upright  and apprpriate  alertness with continued ST.  Wife  present during MBS and edcuated with verbal and visual (diagram)  information and clinical reasoning for recommendations.    Treatment Recommendation  Therapy as outlined in treatment plan below    Diet Recommendation Dysphagia 1 (Puree);Thin liquid   Liquid Administration via: Straw;No straw Medication Administration: Whole meds with puree Supervision: Staff to assist with self feeding;Full  supervision/cueing for compensatory strategies Compensations: Slow rate;Small sips/bites;Check for pocketing Postural Changes and/or Swallow Maneuvers: Seated upright 90  degrees     Breck CoonsLisa Willis KapaaLitaker M.Ed CCC-SLP Pager [phone]  06/06/2014    ASSESMENT:   * ESLD. Decompensated cirrhosis. Attributed to NASH a few years ago. Diagnostic/etiology labs in progress: automimmune markers, Ceruloplasmin. Hepatitis serologies negative, ferritin not elevated enough to rule in hemachromatosis.  *  Dysphagia.  Cleared for purees and thins.  Suspect this limited diet and her AMS will result in suboptimal po intake and pt will need replacement of Dubhoff feeding tube.  * Renal failure. Urine output steadly improving.  BUN, creatinine worse today.  * Hypernatremia, resolved.   *  Hypokalemia.  * Thrombocytopenia. Better yesterday, worse today. .  * Coagulopathy. Improved since 7/4.  * Encephalopathy. On Lactulose and Rocephin. Ammonia level normal for the last 7 days.  * Variceal bleed. EGD 6/22: banding of large varices.Completed Octreotide drip. On Protonix Continues on Rocephin.  * Ascites. Paracentesis at bedside 6/23: no SBP.  Moderate ascites per US 7/6.  * Splenomegaly, severe per ultrasound .  *  Anemia, macrocytic. blood transfusion x 1 unit in progress. Transfused 1 PRBC on 6/22 at time of variceal bleed/ABL anemia. Not iron deficient. The stool is absolutely non-bloody so low suspicion of recurrent variceal bleed.    PLAN   *  Await pending labs and contact with transplant  center per Dr Christella HartiganJacobs timing.    Jennye MoccasinSarah Aily Tzeng  06/07/2014, 12:21 PM Pager: [phone]

## 2014-06-07 NOTE — Progress Notes (Signed)
CRITICAL VALUE ALERT  Critical value received:  Hgb 6.6  Date of notification: 06/07/2014  Time of notification:  0618  Critical value read back:Yes.     Nurse who received alert:  T. Tammi Souhomas, Rn  MD notified (1st page):  Craige CottaKirby, NP  Time of first page:  0618  MD notified (2nd page):  Time of second page:  Responding MD:  Craige CottaKirby, NP  Time MD responded:  [phone]

## 2014-06-08 LAB — COMPREHENSIVE METABOLIC PANEL
ALT: 32 U/L (ref 0–35)
ANION GAP: 18 — AB (ref 5–15)
AST: 77 U/L — ABNORMAL HIGH (ref 0–37)
Albumin: 2.8 g/dL — ABNORMAL LOW (ref 3.5–5.2)
Alkaline Phosphatase: 346 U/L — ABNORMAL HIGH (ref 39–117)
BUN: 49 mg/dL — AB (ref 6–23)
CO2: 17 mEq/L — ABNORMAL LOW (ref 19–32)
CREATININE: 2.45 mg/dL — AB (ref 0.50–1.10)
Calcium: 8.4 mg/dL (ref 8.4–10.5)
Chloride: 109 mEq/L (ref 96–112)
GFR calc non Af Amer: 22 mL/min — ABNORMAL LOW (ref >90)
GFR, EST AFRICAN AMERICAN: 26 mL/min — AB (ref >90)
GLUCOSE: 139 mg/dL — AB (ref 70–99)
Potassium: 3.7 mEq/L (ref 3.7–5.3)
Sodium: 144 mEq/L (ref 137–147)
TOTAL PROTEIN: 6.6 g/dL (ref 6.0–8.3)
Total Bilirubin: 1.7 mg/dL — ABNORMAL HIGH (ref 0.3–1.2)

## 2014-06-08 LAB — CBC
HCT: 25.8 % — ABNORMAL LOW (ref 36.0–46.0)
HEMOGLOBIN: 8.4 g/dL — AB (ref 12.0–15.0)
MCH: 31.6 pg (ref 26.0–34.0)
MCHC: 32.6 g/dL (ref 30.0–36.0)
MCV: 97 fL (ref 78.0–100.0)
Platelets: 42 10*3/uL — ABNORMAL LOW (ref 150–400)
RBC: 2.66 MIL/uL — AB (ref 3.87–5.11)
RDW: 23 % — ABNORMAL HIGH (ref 11.5–15.5)
WBC: 7.4 10*3/uL (ref 4.0–10.5)

## 2014-06-08 LAB — GLUCOSE, CAPILLARY
GLUCOSE-CAPILLARY: 107 mg/dL — AB (ref 70–99)
GLUCOSE-CAPILLARY: 141 mg/dL — AB (ref 70–99)
Glucose-Capillary: 124 mg/dL — ABNORMAL HIGH (ref 70–99)
Glucose-Capillary: 175 mg/dL — ABNORMAL HIGH (ref 70–99)

## 2014-06-08 LAB — TYPE AND SCREEN
ABO/RH(D): O POS
Antibody Screen: NEGATIVE
UNIT DIVISION: 0

## 2014-06-08 LAB — AMMONIA: AMMONIA: 38 umol/L (ref 11–60)

## 2014-06-08 MED ORDER — SODIUM CHLORIDE 0.9 % IJ SOLN
10.0000 mL | INTRAMUSCULAR | Status: DC | PRN
  Administered 2014-06-08: 10 mL

## 2014-06-08 MED ORDER — SODIUM CHLORIDE 0.9 % IJ SOLN
10.0000 mL | Freq: Two times a day (BID) | INTRAMUSCULAR | Status: DC
  Administered 2014-06-09 (×2): 10 mL

## 2014-06-08 MED ORDER — WHITE PETROLATUM GEL
Status: AC
  Administered 2014-06-08: 14:00:00
  Filled 2014-06-08: qty 5

## 2014-06-08 NOTE — Progress Notes (Signed)
Notified by [facility] of family choice for [facility] services after discharge.  Hospice eligibility confirmed with Dr. Alferd Patee, [place] Director. Patient eligible with Dx of Cirhhosis of the liver. Met with patient's husband Richardson Landry, son Earley Favor, daughter Raquel Sarna and Townsend Roger to initiate education regarding hospice services, team approach and philosophy of care. Questions answered and understanding voiced, family is very tired and overwhelmed with patient's rapid decline. Equipment needs addressed, family requests: Complete package D which includes fully electric hospital bed with AP&P mattress and full rails and overbed table. Family is not able to make plans for delivery of equipment prior to Saturday morning.  Patient's PCP: Dr. Nydia Bouton Pharmacy: [facility] [address] Initial information faxed to [facility] referral Center. Completed D/C sumamry will need to be faxed to [facility] referral center @ [phone] when final. Please notify [facility] when patient is ready to leave the unit @ [phone]. [facility] written information and contact numbers given to patient's daughter Raquel Sarna and son Earley Favor. Above information shared with [facility].  7/10 addendum: Spoke again this morning with patient's husband and daughter, confirmed equipment needs and pharmacy name. **Family has requested that for pt comfort both the rectal tube and foley be left in at discharge. ** Pt will need GOLD DNR form for transport via non emergent transport when discharged. **Please include saline flushes in discharge medication list for PICC line flushes/maintainance.  Writer spoke with NP Erin Hearing regarding discharge, at time of writing discharge is planned for 7/11. Family in agreement with this time frame.  All above confirmed with CMRN Debbie.  Thank you, Flo Shanks RN, [facility] Liaison Please call with questions  [phone]

## 2014-06-08 NOTE — Care Management Note (Addendum)
    Page 1 of 2   06/09/2014     10:52:58 AM CARE MANAGEMENT NOTE 06/09/2014  Patient:  Dana Petersen,Dana Petersen   Account Number:  [id number]  Date Initiated:  05/22/2014  Documentation initiated by:  Junius CreamerWELL,DEBBIE  Subjective/Objective Assessment:   adm w gi bleed     Action/Plan:   lives w fam, pcp dr Viviann Sparesteven fry   Anticipated DC Date:     Anticipated DC Plan:  HOME W HOSPICE CARE  In-house referral  Clinical Social Worker      DC Associate Professorlanning Services  CM consult      [facility]   Choice offered to / List presented to:  C-3 Spouse        HH arranged  HH-1 RN  HH-6 SOCIAL WORKER      HH agency  [facility] [place]   Status of service:  Completed, signed off Medicare Important Message given?   (If response is "NO", the following Medicare IM given date fields will be blank) Date Medicare IM given:   Medicare IM given by:   Date Additional Medicare IM given:   Additional Medicare IM given by:    Discharge Disposition:  HOME W HOSPICE CARE  Per UR Regulation:  Reviewed for med. necessity/level of care/duration of stay  If discussed at Long Length of Stay Meetings, dates discussed:    Comments:  7/10  1047a debbie Dana Hodo rn,bsn placed out of facility dnr on shadow chart. pt for dc on sat 7-11. hospice aware.will ask sw for ptar for amb transp home.  7/9  1517 debbie Dana Shaneyfelt rn,bsn spoke w pt's husband. they were given hospice lists for home. they live in [place] but prefer hospice and paliative care of [place]. ref made to karen who will talk w fam and assist w eq needed also. husb not sure of needs yet but may need hosp bed w overlay.  06/07/14 1031 Dana Mayo RN MSN BSN CCM Pt with ESLD.  GI to discuss case with transplant team. 1252 [facility]  Demographic sheet faxed per request.  05-25-14 8:35am Dana ArenasSarah Petersen, RNBSN [phone] Remains on vent

## 2014-06-08 NOTE — Consult Note (Signed)
I have reviewed and discussed case with Nurse Practitioner And agree with documentation and plan as noted above   Derreck Wiltsey J. Bridgit Eynon D.O.  Palliative Medicine Team at [place]  Team Phone: [phone]    

## 2014-06-08 NOTE — Progress Notes (Signed)
Contacted Care [facility] and Onalee HuaDavid at [facility] at [phone] to let them know that Mrs. Dana Petersen would not be transferring to [facility] tonight.

## 2014-06-08 NOTE — Progress Notes (Signed)
I have personally seen and examined this patient and agree with the assessment/plan as outlined above by Mariea ClontsEmokpae MD (PGY2). Stable albeit low renal function. No intervention planned at this time-no indications for dialysis noted. Encouraged oral intake and continue furosemide for her anasarca. Family attempting to make decision regarding [facility] versus adopting a palliative care/hospice approach.  Dennie Vecchio K.,MD 06/08/2014 9:53 AM

## 2014-06-08 NOTE — Progress Notes (Signed)
Peripherally Inserted Central Catheter/Midline Placement  The IV Nurse has discussed with the patient and/or persons authorized to consent for the patient, the purpose of this procedure and the potential benefits and risks involved with this procedure.  The benefits include less needle sticks, lab draws from the catheter and patient may be discharged home with the catheter.  Risks include, but not limited to, infection, bleeding, blood clot (thrombus formation), and puncture of an artery; nerve damage and irregular heat beat.  Alternatives to this procedure were also discussed.  PICC/Midline Placement Documentation  PICC / Midline Single Lumen 06/08/14 Midline Left Basilic 13 cm 0 cm (Active)  Indication for Insertion or Continuance of Line Home intravenous therapies (PICC only) 06/08/2014 12:00 PM  Exposed Catheter (cm) 0 cm 06/08/2014 12:00 PM  Dressing Change Due 06/15/14 06/08/2014 12:00 PM       Stacie GlazeJoyce, Symphony Demuro Horton 06/08/2014, 12:25 PM

## 2014-06-08 NOTE — Progress Notes (Signed)
[place] TEAM 1 - Stepdown/ICU TEAM Progress Note  Bing MatterMelissa B Vandenbos ZOX:[id number] DOB: 06-25-1966 DOA: 05/22/2014 PCP: Nelwyn SalisburyFRY,STEPHEN A, MD  Admit HPI / Brief Narrative: [age] F w/ hx of Hx depression, seizure, HTN, and cirrhosis of unclear etiology. [facility] AM with hematemesis and decreased LOC. [facility] after hospitalization for hepatic encephalopathy. Due to inability to protect airway she was intubated. GI was consulted and she was also placed on Octerotide infusion. EGD revealed large esophageal varices (requiring 7 bands) and portal gastropathy. She also underwent large volume paracentesis with 10,000 cc removed. She was eventually extubated and after treatment for encephalopathy she was able to awaken somewhat by 6/28. She was subsequently made a DNR this admission.  HPI/Subjective: Awake but non verbal today -makes eye contact  Assessment/Plan:  Cryptogenic cirrhosis:  -Dr. Christella HartiganJacobs following for GI- unable to locate any prior definitive hepatic work up so initiating this admission -7/8 [facility] and pt accepted by the Hospitalist service for tx to eval if liver transplant candidate but family with multiple concerns-specifically previously informed by GI/[place] very unlikely she would qualify and [place] for family to visit if placed in facility in that area -After lengthy d/w family per myself and the Palliative Team the family has decided that it would be in the pt's best interests to defer transplant evaluation and instead pursue comfort measures preferably in the pt's home- aware that pt's level of care may require SNF placement so they agreed to bed search if at time of dc family would not be able to provide care as planned  Ascites  -GI considering add'l low volume paracentesis for comfort only -albumin 2.8   Coagulopathy -currently no signs of active bleeding monitor closely   Thrombocytopenia  -Continued severe  thrombocytopenia - no evidence currently of bleeding - monitor closely  Esophageal varices and portal gastropathy  -initial dx in 2009 and felt to be 2/2 ETOH but family denies pt with prior alcoholism so ?? NASH vs auto immune?? - GI has started evaluation 7/6 -MELD score 31 which equals a 52% mortality and Child-Pugh score is 14 which equals a Class C  -currently no signs of bleeding  -GI has documented if can get pt stable that should be considered for transplant list  -due to GIB GI recommend continue Rocephin 1 g daily for SBP prophylaxis   Hepatic encephalopathy  -cont Rifaximin and Lactulose  -ammonia normal on current tx -mentation improved but labile  Dysphagia -SLP eval with MBSS- rec D1 diet  Acute renal failure/hypernatremia  -Renal following and suspected pre renal etiology exacerbated by admission hypotension (10 L removed last paracentesis) -Na has normalized-still in positive balance 6L but most of this appears to be edema and ascites -cont Lasix (started by Nephro) -kvo D5W but can increase rate if Na increases  Upper GI bleed due to esophageal varices  -GI recommended repeat EGD around July 22 but may not pursue given current dc plans  Poor venous access -CL has been in place since 6/22 -IV Team to place midline cath that pt can dc home with to use for comfort meds  Acute respiratory failure with hypoxemia  -primarily intubated for airway protection  -CXR 6/29 unremarkable for PNA   History SEIZURE, GRAND MAL  -was due to Alprazolam withdrawal   HYPERTENSION  -controlled off meds   Acute blood loss anemia  -admission hgb was 12 and had been stable around 7 but slowly drifted  down over several days-  -7/8 hgb down to 6.6 so transfused 1 unit PRBCs  DEPRESSIVE DISORDER, RCR, MODERATE  -Continue Prozac 20 mg daily once swallow re eval  GERD   Substance abuse/Nicotine abuse  -has not used ETOH regularly for >10 years- was using prescribed BZDs pre  admit   Sore throat  -Continue Cetacaine spray TID PRN  Code Status: DO NOT RESUSCITATE  Family Communication: None   Disposition Plan/Expected LOS: Husband and children at bedside-PT rec SNF- family desires home with Hospice HH to SNF- if qualifies for residential Hospice they will consider that- ?? Dc in next 48-72 hrs- keep on team 1 for continuity  Consultants: Dr. Yancey Flemings (gastroenterology) Dr. Terrial Rhodes (nephrology)  Procedure/Significant Events: ETT6/22 >> 6/25, 6/25 >> 6/27  L IJ CVL 6/22 >>  7/1 echocardiogram - Left ventricle: mild LVH. LVEF= 60%.  - Left atrium: mildly dilated.  Culture UC 6/22 >> negative  Resp 6/22 >> mod staph >> MRSA  Blood 6/22 >> canceled / not drawn  Peritoneal Fluid 6/22 >> rare WBC >> negative  Antibiotics: Ceftriaxone (SBP px) 6/22 >> d/c 6/28; restarted 7/5>>  DVT prophylaxis: SCD  LINES / TUBES:  6/22 triple-lumen LIJ CVC  Objective: VITAL SIGNS: Blood pressure 138/73, pulse 99, temperature 97.1 F (36.2 C), temperature source Axillary, resp. rate 15, height 5\' 4"  (1.626 m), weight 211 lb (95.709 kg), last menstrual period 05/25/2014, SpO2 100.00%.   Intake/Output Summary (Last 24 hours) at 06/08/14 1037 Last data filed at 06/08/14 0900  Gross per 24 hour  Intake 1129.5 ml  Output    350 ml  Net  779.5 ml   Exam: General: Awake and non verbal today Lungs: Clear to auscultation bilaterally without wheezes or crackles, RA  Cardiovascular: Regular rate and rhythm without murmur gallop or rub normal S1 and S2 Abdomen: Nontender, distended with stable ascites, soft, bowel sounds positive, no rebound,no appreciable mass Musculoskeletal: No significant cyanosis, clubbing of bilateral lower extremities, bilateral lower extremity 3+ c/w evolving anasarca  Data Reviewed: Basic Metabolic Panel:  Recent Labs Lab 06/03/14 0500 06/04/14 0400 06/05/14 0400 06/06/14 0400 06/07/14 0500 06/08/14 0230  NA 154* 151* 148*  144 145 144  K 3.7 3.9 3.8 3.8 3.6* 3.7  CL 121* 118* 116* 111 111 109  CO2 18* 19 19 15* 20 17*  GLUCOSE 157* 157* 151* 120* 114* 139*  BUN 36* 41* 44* 46* 49* 49*  CREATININE 2.79* 2.68* 2.58* 2.45* 2.54* 2.45*  CALCIUM 8.1* 8.3* 8.1* 8.5 8.0* 8.4  MG 1.9 1.8 1.9  --   --   --    Liver Function Tests:  Recent Labs Lab 06/04/14 0400 06/05/14 0400 06/06/14 0400 06/07/14 0500 06/08/14 0230  AST 124* 113* 97* 76* 77*  ALT 35 38* 33 31 32  ALKPHOS 424* 431* 363* 333* 346*  BILITOT 1.5* 1.4* 1.8* 1.4* 1.7*  PROT 6.5 6.4 6.5 6.3 6.6  ALBUMIN 2.9* 2.8* 2.8* 2.7* 2.8*    Recent Labs Lab 06/04/14 0400 06/05/14 0415 06/06/14 0400 06/07/14 0500 06/08/14 0230  AMMONIA 24 28 35 26 38   CBC:  Recent Labs Lab 06/04/14 0400 06/05/14 0400 06/06/14 0400 06/07/14 0500 06/08/14 0230  WBC 8.0 7.9 8.4 5.0 7.4  HGB 7.5* 7.0* 7.3* 6.6* 8.4*  HCT 24.4* 21.9* 22.8* 21.5* 25.8*  MCV 98.8 99.1 98.7 101.9* 97.0  PLT 26* 28* 55* 39* 42*   CBG:  Recent Labs Lab 06/07/14 0757 06/07/14 1130 06/07/14 1758 06/07/14 2132 06/08/14  0821  GLUCAP 96 122* 154* 149* 107*   Studies:  Recent x-ray studies have been reviewed in detail by the Attending Physician  Scheduled Meds:  Scheduled Meds: . antiseptic oral rinse  15 mL Mouth Rinse QID  . calamine   Topical TID  . cefTRIAXone (ROCEPHIN)  IV  1 g Intravenous Q24H  . chlorhexidine  15 mL Mouth Rinse BID  . feeding supplement (GLUCERNA SHAKE)  237 mL Oral BID BM  . feeding supplement (RESOURCE BREEZE)  1 Container Oral Q24H  . furosemide  80 mg Intravenous Daily  . insulin aspart  0-15 Units Subcutaneous TID WC  . insulin aspart  0-5 Units Subcutaneous QHS  . lactulose  300 mL Rectal Daily   Or  . lactulose  20 g Oral Daily  . pantoprazole  40 mg Oral Daily  . potassium chloride  20 mEq Oral BID  . rifaximin  550 mg Oral BID  . sodium bicarbonate  650 mg Oral BID   Time spent on care of this patient: 35  mins  ELLIS,ALLISON L. , ANP  [facility] Office  [phone] Pager - [phone]  On-Call/Text Page:      [email or web address]      password [email or web address]  If 7PM-7AM, please contact night-coverage [email or web address] Password [email or web address] 06/08/2014, 10:37 AM   LOS: 17 days   Attending Patient was seen, examined,treatment plan was discussed with the Physician extender. I have directly reviewed the clinical findings, lab, imaging studies and management of this patient in detail. I have made the necessary changes to the above noted documentation, and agree with the documentation, as recorded by the Physician extender.  Windell Norfolk MD [facility].

## 2014-06-08 NOTE — Progress Notes (Signed)
Progress Note from the Palliative Medicine Team at [facility]  Subjective: I had a long discussion with [name] and her husband, son and daughter with the help of Junious SilkAllison Ellis, NP. The family has decided that they do not believe it to be in Dana Petersen's best interest to pursue transplant possibility with [facility]. They have decided to comfort/comfort feeds and hospice options.   We further discussed options for comfort care: home with hospice vs SNF. All family agree she would want to be at home. They say she "was always a homebody." We then discussed the limited amount of time hospice would be in the home and that they must all be committed to caring for her at home because it takes more than one person. Daughter says that she has worked as a LawyerCNA and that she is committed to being available 24/7 if necessary for her to go home. We discussed options of working with hospice and utilizing their social worker to help with transition to SNF or inpatient hospice facility if home becomes unmanageable. They agree this will be a good option to keep in mind. They all agree that they believe she will be most happy at home.     Objective: Allergies  Allergen Reactions  . Aspirin Other (See Comments)    Causes elevated liver enzymes  . Phenergan [Promethazine Hcl] Other (See Comments)    Muscle spasms  . Tylenol [Acetaminophen]     Caused elevated liver enzymes   Scheduled Meds: . antiseptic oral rinse  15 mL Mouth Rinse QID  . calamine   Topical TID  . cefTRIAXone (ROCEPHIN)  IV  1 g Intravenous Q24H  . chlorhexidine  15 mL Mouth Rinse BID  . feeding supplement (GLUCERNA SHAKE)  237 mL Oral BID BM  . feeding supplement (RESOURCE BREEZE)  1 Container Oral Q24H  . furosemide  80 mg Intravenous Daily  . insulin aspart  0-15 Units Subcutaneous TID WC  . insulin aspart  0-5 Units Subcutaneous QHS  . lactulose  300 mL Rectal Daily   Or  . lactulose  20 g Oral Daily  .  pantoprazole  40 mg Oral Daily  . potassium chloride  20 mEq Oral BID  . rifaximin  550 mg Oral BID  . sodium bicarbonate  650 mg Oral BID  . sodium chloride  10-40 mL Intracatheter Q12H   Continuous Infusions: . dextrose 10 mL/hr at 06/07/14 1219   PRN Meds:.butamben-tetracaine-benzocaine, ondansetron (ZOFRAN) IV, sodium chloride  BP 138/73  Pulse 99  Temp(Src) 98.8 F (37.1 C) (Oral)  Resp 15  Ht 5\' 4"  (1.626 m)  Wt 95.709 kg (211 lb)  BMI 36.20 kg/m2  SpO2 100%  LMP 05/25/2014   PPS: 30% at best     Intake/Output Summary (Last 24 hours) at 06/08/14 1828 Last data filed at 06/08/14 0900  Gross per 24 hour  Intake    260 ml  Output    350 ml  Net    -90 ml      LBM: 06/08/14      Physical Exam:  General: NAD, awake, alert  HEENT: [place]/AT, moist mucous membranes, no JVD  Chest: No labored breathing, symmetric  CVS: RRR  Abdomen: Soft, NT, distended, flexiseal in place with liquid stool output  Ext: BLE edema 2+, MAE, warm to touch  Neuro: Alert, awake, confused, non verbal to me, follows simple commands    Labs: CBC    Component Value Date/Time  WBC 7.4 06/08/2014 0230   WBC 2.9* 04/19/2013 1109   RBC 2.66* 06/08/2014 0230   RBC 3.77 04/19/2013 1109   HGB 8.4* 06/08/2014 0230   HGB 12.3 04/19/2013 1109   HCT 25.8* 06/08/2014 0230   HCT 36.0 04/19/2013 1109   PLT 42* 06/08/2014 0230   PLT 39* 04/19/2013 1109   MCV 97.0 06/08/2014 0230   MCV 95.4 04/19/2013 1109   MCH 31.6 06/08/2014 0230   MCH 32.6 04/19/2013 1109   MCHC 32.6 06/08/2014 0230   MCHC 34.2 04/19/2013 1109   RDW 23.0* 06/08/2014 0230   RDW 15.8* 04/19/2013 1109   LYMPHSABS 1.2 05/30/2014 0500   LYMPHSABS 0.5* 04/19/2013 1109   MONOABS 0.4 05/30/2014 0500   MONOABS 0.2 04/19/2013 1109   EOSABS 0.3 05/30/2014 0500   EOSABS 0.1 04/19/2013 1109   BASOSABS 0.1 05/30/2014 0500   BASOSABS 0.0 04/19/2013 1109    BMET    Component Value Date/Time   NA 144 06/08/2014 0230   NA 138 04/19/2013 1109   K 3.7 06/08/2014 0230   K  3.0* 04/19/2013 1109   CL 109 06/08/2014 0230   CL 99 04/19/2013 1109   CO2 17* 06/08/2014 0230   CO2 27 04/19/2013 1109   GLUCOSE 139* 06/08/2014 0230   GLUCOSE 115* 04/19/2013 1109   BUN 49* 06/08/2014 0230   BUN 11.0 04/19/2013 1109   CREATININE 2.45* 06/08/2014 0230   CREATININE 0.8 04/19/2013 1109   CALCIUM 8.4 06/08/2014 0230   CALCIUM 7.5* 04/19/2013 1109   GFRNONAA 22* 06/08/2014 0230   GFRAA 26* 06/08/2014 0230    CMP     Component Value Date/Time   NA 144 06/08/2014 0230   NA 138 04/19/2013 1109   K 3.7 06/08/2014 0230   K 3.0* 04/19/2013 1109   CL 109 06/08/2014 0230   CL 99 04/19/2013 1109   CO2 17* 06/08/2014 0230   CO2 27 04/19/2013 1109   GLUCOSE 139* 06/08/2014 0230   GLUCOSE 115* 04/19/2013 1109   BUN 49* 06/08/2014 0230   BUN 11.0 04/19/2013 1109   CREATININE 2.45* 06/08/2014 0230   CREATININE 0.8 04/19/2013 1109   CALCIUM 8.4 06/08/2014 0230   CALCIUM 7.5* 04/19/2013 1109   PROT 6.6 06/08/2014 0230   PROT 6.9 04/19/2013 1109   ALBUMIN 2.8* 06/08/2014 0230   ALBUMIN 3.0* 04/19/2013 1109   AST 77* 06/08/2014 0230   AST 52* 04/19/2013 1109   ALT 32 06/08/2014 0230   ALT 32 04/19/2013 1109   ALKPHOS 346* 06/08/2014 0230   ALKPHOS 235* 04/19/2013 1109   BILITOT 1.7* 06/08/2014 0230   BILITOT 2.59* 04/19/2013 1109   GFRNONAA 22* 06/08/2014 0230   GFRAA 26* 06/08/2014 0230    Assessment and Plan: 1. Code Status: DNR 2. Symptom Control: 1. Weakness: Continue medical management. PT following.  2. Nausea: Ondansetron prn.  3. Psycho/Social: Provided much emotional support to family having difficulty making decisions.  4. Disposition: Home with hospice.     Time In Time Out Total Time Spent with Patient Total Overall Time  1400 1500     Greater than 50%  of this time was spent counseling and coordinating care related to the above assessment and plan.  Yong Channel, NP Palliative Medicine Team Pager # [phone] (M-F 8a-5p) Team Phone # [phone] (Nights/Weekends)   1

## 2014-06-08 NOTE — Progress Notes (Signed)
S:  Pt says she is thirsty. No other complaints. Family at bedside- son, daughter and spouse. Voiced concerns over transfer to [facility] for evaluation for Liver transplant.  O:BP 123/65  Pulse 95  Temp(Src) 97.1 F (36.2 C) (Axillary)  Resp 12  Ht 5\' 4"  (1.626 m)  Wt 211 lb (95.709 kg)  BMI 36.20 kg/m2  SpO2 99%  LMP 05/25/2014  Intake/Output Summary (Last 24 hours) at 06/08/14 0859 Last data filed at 06/08/14 0800  Gross per 24 hour  Intake 1409.5 ml  Output    350 ml  Net 1059.5 ml   Intake/Output: I/O last 3 completed shifts: In: 1574.3 [P.O.:440; I.V.:404.8; Blood:439.5; Other:240; IV Piggyback:50] Out: 850 [Urine:850] Total I/O In: 10 [I.V.:10] Out: -  Weight change: 222 today-->199 lbs since admission  Vitals noted. Physical Exam-  Gen:  Obeys some commands, few one word answers- appropriate. HEENT: At, [place], mildly dry oral mucosa.  CVS: Regular, no added sounds Resp: moving equal vols of air, no crakles.  Abd: Marked Distension, not tender to palpation.  Ext: multiple ecchymosis upper extremities, and +3 pitting pedal edema, worsening. Neuro: Alert, mild improvement from yesterday.  Recent Labs Lab 06/02/14 0225 06/03/14 0500 06/04/14 0400 06/05/14 0400 06/06/14 0400 06/07/14 0500 06/08/14 0230  NA 155* 154* 151* 148* 144 145 144  K 3.5* 3.7 3.9 3.8 3.8 3.6* 3.7  CL 123* 121* 118* 116* 111 111 109  CO2 19 18* 19 19 15* 20 17*  GLUCOSE 159* 157* 157* 151* 120* 114* 139*  BUN 30* 36* 41* 44* 46* 49* 49*  CREATININE 2.92* 2.79* 2.68* 2.58* 2.45* 2.54* 2.45*  ALBUMIN 3.0* 3.0* 2.9* 2.8* 2.8* 2.7* 2.8*  CALCIUM 8.2* 8.1* 8.3* 8.1* 8.5 8.0* 8.4  AST 84* 99* 124* 113* 97* 76* 77*  ALT 23 27 35 38* 33 31 32   Liver Function Tests:  Recent Labs Lab 06/06/14 0400 06/07/14 0500 06/08/14 0230  AST 97* 76* 77*  ALT 33 31 32  ALKPHOS 363* 333* 346*  BILITOT 1.8* 1.4* 1.7*  PROT 6.5 6.3 6.6  ALBUMIN 2.8* 2.7* 2.8*    Recent Labs Lab  06/06/14 0400 06/07/14 0500 06/08/14 0230  AMMONIA 35 26 38   CBC:  Recent Labs Lab 06/04/14 0400 06/05/14 0400 06/06/14 0400 06/07/14 0500 06/08/14 0230  WBC 8.0 7.9 8.4 5.0 7.4  HGB 7.5* 7.0* 7.3* 6.6* 8.4*  HCT 24.4* 21.9* 22.8* 21.5* 25.8*  MCV 98.8 99.1 98.7 101.9* 97.0  PLT 26* 28* 55* 39* 42*   CBG:  Recent Labs Lab 06/07/14 0757 06/07/14 1130 06/07/14 1758 06/07/14 2132 06/08/14 0821  GLUCAP 96 122* 154* 149* 107*    Iron Studies:   Recent Labs  06/05/14 1315  IRON 71  TIBC 126*  FERRITIN 153   Studies/Results: Dg Swallowing Func-speech Pathology  06/06/2014   Breck Coons [place], CCC-SLP     06/06/2014 12:16 PM Objective Swallowing Evaluation:  Modified Barium Swallow  evaluation Patient Details  Name: Dana Petersen MRN: [id number] Date of Birth: [birth year]  Today's Date: 06/06/2014 Time: 4098-1191 SLP Time Calculation (min): 14 min  Past Medical History:  Past Medical History  Diagnosis Date  . ABNORMAL FINDINGS GI TRACT 09/12/2008  . ABNORMAL TRANSAMINASE-LFT'S 09/12/2008  . Acute bronchitis 07/21/2008  . Cirrhosis of liver without mention of alcohol 08/11/2008    sees Dr. Yancey Flemings  . DEPRESSIVE DISORDER, RCR, MODERATE 07/09/2007  . DIABETES MELLITUS, TYPE II 07/09/2007  . GERD 09/07/2008  . Headache(784.0) 09/24/2010  .  HIATAL HERNIA 09/07/2008  . HYPERTENSION 07/09/2007  . LOW BACK PAIN 07/02/2010  . Overweight(278.02) 10/01/2007  . 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 Seizures   . Anemia   . Pancytopenia     sees Dr. Eli HoseFiras Shadad   . Thrombocytopenia, unspecified 04/19/2013  . Anemia, unspecified 04/19/2013   Past Surgical History:  Past Surgical History  Procedure Laterality Date  . Tubal ligation    . Carpal tunnel release    . Lumbar laminectomy      L5-S1  dr. Marianne Sofiaappling  . Esophagogastroduodenoscopy N/A 05/22/2014    Procedure: ESOPHAGOGASTRODUODENOSCOPY (EGD);  Surgeon: Theda BelfastPatrick  D Hung, MD;  [facility];  Service: Endoscopy;   Laterality: N/A;  bedside    HPI:  [id number]  WF PMHx depression, seizure, HTN, cirrhosis of unclear  etiology although does have prior history of polysubstance abuse  including alcohol. [facility] AM with hematemesis  and decreased LOC. [facility]  [facility] for hepatic encephalopathy. Due to inability to  protect airway she was intubated. GI was consulted and she was  also placed on Octerotide infusion. 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 She also  underwent large volume paracentesis with 10,000 cc removed. She  was eventually extubated and after treatment for encephalopathy  she was able to awaken somewhat by 6/28. Pt evalauted on 6/30 and  7/1, recommended NPO by SLP. Orders then D/C/d by GI PA. Pts  Panda came out and SLP reordered.      Assessment / Plan / Recommendation Clinical Impression  Dysphagia Diagnosis:  (mild-mod oral; mild-mod pharyngeal) Clinical impression: Pt. alert with decreased delayed  motor/verbal responses compared to session this morning.  She  demonstrated mild-moderate sensory based oropharyngeal dysphagia  with weak oral manipulation, delayed transit with mechanical soft  texture and pharyngeal swallow initiated at the valleculae.   Straw sips thin barium increased oral volume and velocity  resulting in mild silent penetration.  SLP recommends Dys 1 diet  texture with thin liquids, no straws, pills whole in applesauce,  sit upright  and apprpriate alertness with continued ST.  Wife  present during MBS and edcuated with verbal and visual (diagram)  information and clinical reasoning for recommendations.    Treatment Recommendation  Therapy as outlined in treatment plan below    Diet Recommendation Dysphagia 1 (Puree);Thin liquid   Liquid Administration via: Straw;No straw Medication Administration: Whole meds with puree Supervision: Staff to assist with self feeding;Full  supervision/cueing for compensatory strategies Compensations: Slow  rate;Small sips/bites;Check for pocketing Postural Changes and/or Swallow Maneuvers: Seated upright 90  degrees    Other  Recommendations Recommended Consults: MBS Oral Care Recommendations: Oral care BID   Follow Up Recommendations  Skilled Nursing facility    Frequency and Duration min 2x/week  2 weeks   Pertinent Vitals/Pain WDL       Reason for Referral     Oral Phase Oral Preparation/Oral Phase Oral Phase: Impaired Oral - Solids Oral - Mechanical Soft: Weak lingual manipulation;Delayed oral  transit   Pharyngeal Phase Pharyngeal Phase Pharyngeal Phase: Impaired Pharyngeal - Honey Pharyngeal - Honey Teaspoon: Delayed swallow initiation;Premature  spillage to valleculae Pharyngeal - Nectar Pharyngeal - Nectar Teaspoon: Delayed swallow  initiation;Premature spillage to valleculae Pharyngeal - Nectar Cup: Delayed swallow initiation;Premature  spillage to valleculae Pharyngeal - Thin Pharyngeal - Thin Teaspoon: Delayed swallow initiation;Premature  spillage to valleculae Pharyngeal - Thin Cup: Premature spillage to valleculae;Delayed  swallow initiation  Pharyngeal - Thin Straw: Premature spillage to valleculae;Delayed  swallow initiation;Penetration/Aspiration during swallow Penetration/Aspiration details (thin straw): Material enters  airway, CONTACTS cords and not ejected out Pharyngeal - Solids Pharyngeal - Puree: Delayed swallow initiation;Premature spillage  to valleculae  Cervical Esophageal Phase        Cervical Esophageal Phase Cervical Esophageal Phase: Leonarda Salon         Darrow Bussing.Ed [employer] [phone]  06/06/2014   . antiseptic oral rinse  15 mL Mouth Rinse QID  . calamine   Topical TID  . cefTRIAXone (ROCEPHIN)  IV  1 g Intravenous Q24H  . chlorhexidine  15 mL Mouth Rinse BID  . feeding supplement (GLUCERNA SHAKE)  237 mL Oral BID BM  . feeding supplement (RESOURCE BREEZE)  1 Container Oral Q24H  . furosemide  80 mg Intravenous Daily  . insulin aspart  0-15 Units Subcutaneous TID WC  .  insulin aspart  0-5 Units Subcutaneous QHS  . lactulose  300 mL Rectal Daily   Or  . lactulose  20 g Oral Daily  . pantoprazole  40 mg Oral Daily  . potassium chloride  20 mEq Oral BID  . rifaximin  550 mg Oral BID  . sodium bicarbonate  650 mg Oral BID    BMET    Component Value Date/Time   NA 144 06/08/2014 0230   NA 138 04/19/2013 1109   K 3.7 06/08/2014 0230   K 3.0* 04/19/2013 1109   CL 109 06/08/2014 0230   CL 99 04/19/2013 1109   CO2 17* 06/08/2014 0230   CO2 27 04/19/2013 1109   GLUCOSE 139* 06/08/2014 0230   GLUCOSE 115* 04/19/2013 1109   BUN 49* 06/08/2014 0230   BUN 11.0 04/19/2013 1109   CREATININE 2.45* 06/08/2014 0230   CREATININE 0.8 04/19/2013 1109   CALCIUM 8.4 06/08/2014 0230   CALCIUM 7.5* 04/19/2013 1109   GFRNONAA 22* 06/08/2014 0230   GFRAA 26* 06/08/2014 0230   CBC    Component Value Date/Time   WBC 7.4 06/08/2014 0230   WBC 2.9* 04/19/2013 1109   RBC 2.66* 06/08/2014 0230   RBC 3.77 04/19/2013 1109   HGB 8.4* 06/08/2014 0230   HGB 12.3 04/19/2013 1109   HCT 25.8* 06/08/2014 0230   HCT 36.0 04/19/2013 1109   PLT 42* 06/08/2014 0230   PLT 39* 04/19/2013 1109   MCV 97.0 06/08/2014 0230   MCV 95.4 04/19/2013 1109   MCH 31.6 06/08/2014 0230   MCH 32.6 04/19/2013 1109   MCHC 32.6 06/08/2014 0230   MCHC 34.2 04/19/2013 1109   RDW 23.0* 06/08/2014 0230   RDW 15.8* 04/19/2013 1109   LYMPHSABS 1.2 05/30/2014 0500   LYMPHSABS 0.5* 04/19/2013 1109   MONOABS 0.4 05/30/2014 0500   MONOABS 0.2 04/19/2013 1109   EOSABS 0.3 05/30/2014 0500   EOSABS 0.1 04/19/2013 1109   BASOSABS 0.1 05/30/2014 0500   BASOSABS 0.0 04/19/2013 1109    Assessment/Plan:  1. AKI- Etiology Prerenal AKI Vs ATN Vs HRS. Cr stable- 2.54 today. Urine output reduced today- , on lasix 80mg  daily.   2. Metabolic acidosis- Bicarb- 17. Likely due to Starvation ketosis and uremia. Now taking orally. Cont daily Bicard- Oral 650 BID.  2. Hypernatremia- Resolved. Na  stable at 144. On regular thin liquids.   3. Hepatic  encephalopathy- Improving, Continue lactulose, and rifaximin.   4. ABLA- Esophageal varices, on PPI, ceftriaxone, and rifaximin.  Mgmt per GI. Pt is ben considered for liver transplant evaluation, plans are  to transfer her to [facility] for evaluation.  5. 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  Family to discuss with palliative care.   6. Thrombocytopenia- 42 today, stable, multiple ecchymosis. Continue to avoid anticoagulants.   7. Anemia - 8.4, transfused 2 units with appropriate response. Likely from ongoing Gi bleed from esophageal varices, with low platelets and also contributing chronic illness. Transfuse per primary.  Kennis Carina, MD IMTS, PGY-2

## 2014-06-09 LAB — COMPREHENSIVE METABOLIC PANEL
ALT: 30 U/L (ref 0–35)
AST: 66 U/L — ABNORMAL HIGH (ref 0–37)
Albumin: 2.8 g/dL — ABNORMAL LOW (ref 3.5–5.2)
Alkaline Phosphatase: 326 U/L — ABNORMAL HIGH (ref 39–117)
Anion gap: 18 — ABNORMAL HIGH (ref 5–15)
BILIRUBIN TOTAL: 1.7 mg/dL — AB (ref 0.3–1.2)
BUN: 48 mg/dL — ABNORMAL HIGH (ref 6–23)
CHLORIDE: 110 meq/L (ref 96–112)
CO2: 19 meq/L (ref 19–32)
CREATININE: 2.37 mg/dL — AB (ref 0.50–1.10)
Calcium: 8.3 mg/dL — ABNORMAL LOW (ref 8.4–10.5)
GFR, EST AFRICAN AMERICAN: 27 mL/min — AB (ref >90)
GFR, EST NON AFRICAN AMERICAN: 23 mL/min — AB (ref >90)
GLUCOSE: 114 mg/dL — AB (ref 70–99)
Potassium: 2.8 mEq/L — CL (ref 3.7–5.3)
SODIUM: 147 meq/L (ref 137–147)
Total Protein: 6.6 g/dL (ref 6.0–8.3)

## 2014-06-09 LAB — MAGNESIUM: Magnesium: 1.7 mg/dL (ref 1.5–2.5)

## 2014-06-09 LAB — CBC
HCT: 24.9 % — ABNORMAL LOW (ref 36.0–46.0)
Hemoglobin: 8.1 g/dL — ABNORMAL LOW (ref 12.0–15.0)
MCH: 31.8 pg (ref 26.0–34.0)
MCHC: 32.5 g/dL (ref 30.0–36.0)
MCV: 97.6 fL (ref 78.0–100.0)
PLATELETS: 43 10*3/uL — AB (ref 150–400)
RBC: 2.55 MIL/uL — AB (ref 3.87–5.11)
RDW: 23.4 % — AB (ref 11.5–15.5)
WBC: 5.4 10*3/uL (ref 4.0–10.5)

## 2014-06-09 LAB — GLUCOSE, CAPILLARY
GLUCOSE-CAPILLARY: 105 mg/dL — AB (ref 70–99)
GLUCOSE-CAPILLARY: 186 mg/dL — AB (ref 70–99)
Glucose-Capillary: 130 mg/dL — ABNORMAL HIGH (ref 70–99)
Glucose-Capillary: 123 mg/dL — ABNORMAL HIGH (ref 70–99)

## 2014-06-09 LAB — AMMONIA: AMMONIA: 18 umol/L (ref 11–60)

## 2014-06-09 MED ORDER — SODIUM CHLORIDE 0.9 % IJ SOLN
10.0000 mL | Freq: Two times a day (BID) | INTRAMUSCULAR | Status: DC
  Administered 2014-06-10: 10 mL via INTRAVENOUS

## 2014-06-09 MED ORDER — MORPHINE SULFATE (CONCENTRATE) 10 MG /0.5 ML PO SOLN
10.0000 mg | ORAL | Status: DC | PRN
  Administered 2014-06-09 – 2014-06-10 (×4): 10 mg via ORAL
  Filled 2014-06-09 (×4): qty 0.5

## 2014-06-09 MED ORDER — RIFAXIMIN 550 MG PO TABS: 550.0000 mg | ORAL_TABLET | Freq: Two times a day (BID) | ORAL | Status: DC

## 2014-06-09 MED ORDER — MORPHINE SULFATE 2 MG/ML IJ SOLN: 1.0000 mg | INTRAMUSCULAR | Status: DC | PRN

## 2014-06-09 MED ORDER — POTASSIUM CHLORIDE 20 MEQ/15ML (10%) PO LIQD
30.0000 meq | Freq: Two times a day (BID) | ORAL | Status: DC
  Administered 2014-06-09 – 2014-06-10 (×2): 30 meq via ORAL
  Filled 2014-06-09 (×5): qty 22.5

## 2014-06-09 MED ORDER — PANTOPRAZOLE SODIUM 40 MG PO TBEC: 40.0000 mg | DELAYED_RELEASE_TABLET | Freq: Every day | ORAL | Status: DC

## 2014-06-09 MED ORDER — POTASSIUM CHLORIDE CRYS ER 20 MEQ PO TBCR
30.0000 meq | EXTENDED_RELEASE_TABLET | Freq: Two times a day (BID) | ORAL | Status: DC
  Filled 2014-06-09 (×2): qty 1

## 2014-06-09 MED ORDER — SODIUM BICARBONATE 650 MG PO TABS: 650.0000 mg | ORAL_TABLET | Freq: Two times a day (BID) | ORAL | Status: DC

## 2014-06-09 MED ORDER — POTASSIUM CHLORIDE 10 MEQ/100ML IV SOLN
10.0000 meq | INTRAVENOUS | Status: AC
Start: 2014-06-09 — End: 2014-06-09
  Administered 2014-06-09 (×4): 10 meq via INTRAVENOUS
  Filled 2014-06-09 (×4): qty 100

## 2014-06-09 MED ORDER — SODIUM CHLORIDE 0.9 % IJ SOLN: 10.0000 mL | Freq: Two times a day (BID) | INTRAMUSCULAR | Status: DC

## 2014-06-09 MED ORDER — ONDANSETRON 4 MG PO TBDP: 4.0000 mg | ORAL_TABLET | Freq: Three times a day (TID) | ORAL | Status: DC | PRN

## 2014-06-09 MED ORDER — PANTOPRAZOLE SODIUM 40 MG PO PACK
40.0000 mg | PACK | Freq: Every day | ORAL | Status: DC
  Administered 2014-06-09 – 2014-06-10 (×2): 40 mg
  Filled 2014-06-09 (×2): qty 20

## 2014-06-09 MED ORDER — POTASSIUM CHLORIDE 10 MEQ/100ML IV SOLN: 10.0000 meq | INTRAVENOUS | Status: DC

## 2014-06-09 NOTE — Progress Notes (Signed)
S:  No complaints today. No communicative today. Eyes open.   O:BP 123/61  Pulse 85  Temp(Src) 98.5 F (36.9 C) (Oral)  Resp 16  Ht 5\' 4"  (1.626 m)  Wt 214 lb (97.07 kg)  BMI 36.72 kg/m2  SpO2 100%  LMP 05/25/2014  Intake/Output Summary (Last 24 hours) at 06/09/14 1009 Last data filed at 06/09/14 0501  Gross per 24 hour  Intake 310.17 ml  Output    850 ml  Net -539.83 ml   Intake/Output: I/O last 3 completed shifts: In: 560.2 [P.O.:240; I.V.:320.2] Out: 850 [Urine:850]   Weight change: 3 lb (1.361 kg)222 today-->199 lbs since admission  Vitals noted. Physical Exam-  Gen: Awake, eyes open. HEENT: At, [place].  CVS: Regular, no added sounds Resp: moving equal vols of air, no crakles.  Abd: Marked Distension, not tender to palpation.  Ext: multiple ecchymosis upper extremities, and +3 pitting pedal edema. Neuro: Alert, mental status same as yesterday.  Recent Labs Lab 06/03/14 0500 06/04/14 0400 06/05/14 0400 06/06/14 0400 06/07/14 0500 06/08/14 0230 06/09/14 0450  NA 154* 151* 148* 144 145 144 147  K 3.7 3.9 3.8 3.8 3.6* 3.7 2.8*  CL 121* 118* 116* 111 111 109 110  CO2 18* 19 19 15* 20 17* 19  GLUCOSE 157* 157* 151* 120* 114* 139* 114*  BUN 36* 41* 44* 46* 49* 49* 48*  CREATININE 2.79* 2.68* 2.58* 2.45* 2.54* 2.45* 2.37*  ALBUMIN 3.0* 2.9* 2.8* 2.8* 2.7* 2.8* 2.8*  CALCIUM 8.1* 8.3* 8.1* 8.5 8.0* 8.4 8.3*  AST 99* 124* 113* 97* 76* 77* 66*  ALT 27 35 38* 33 31 32 30   Liver Function Tests:  Recent Labs Lab 06/07/14 0500 06/08/14 0230 06/09/14 0450  AST 76* 77* 66*  ALT 31 32 30  ALKPHOS 333* 346* 326*  BILITOT 1.4* 1.7* 1.7*  PROT 6.3 6.6 6.6  ALBUMIN 2.7* 2.8* 2.8*    Recent Labs Lab 06/07/14 0500 06/08/14 0230 06/09/14 0450  AMMONIA 26 38 18   CBC:  Recent Labs Lab 06/05/14 0400 06/06/14 0400 06/07/14 0500 06/08/14 0230 06/09/14 0450  WBC 7.9 8.4 5.0 7.4 5.4  HGB 7.0* 7.3* 6.6* 8.4* 8.1*  HCT 21.9* 22.8* 21.5* 25.8* 24.9*  MCV  99.1 98.7 101.9* 97.0 97.6  PLT 28* 55* 39* 42* 43*   CBG:  Recent Labs Lab 06/08/14 0821 06/08/14 1220 06/08/14 1713 06/08/14 2118 06/09/14 0744  GLUCAP 107* 141* 124* 175* 105*   Studies/Results: No results found. Marland Kitchen antiseptic oral rinse  15 mL Mouth Rinse QID  . calamine   Topical TID  . cefTRIAXone (ROCEPHIN)  IV  1 g Intravenous Q24H  . chlorhexidine  15 mL Mouth Rinse BID  . feeding supplement (GLUCERNA SHAKE)  237 mL Oral BID BM  . feeding supplement (RESOURCE BREEZE)  1 Container Oral Q24H  . furosemide  80 mg Intravenous Daily  . insulin aspart  0-15 Units Subcutaneous TID WC  . insulin aspart  0-5 Units Subcutaneous QHS  . lactulose  300 mL Rectal Daily   Or  . lactulose  20 g Oral Daily  . pantoprazole  40 mg Oral Daily  . potassium chloride  30 mEq Oral BID  . potassium chloride  10 mEq Intravenous Q1 Hr x 4  . rifaximin  550 mg Oral BID  . sodium bicarbonate  650 mg Oral BID  . sodium chloride  10-40 mL Intracatheter Q12H    BMET    Component Value Date/Time   NA  147 06/09/2014 0450   NA 138 04/19/2013 1109   K 2.8* 06/09/2014 0450   K 3.0* 04/19/2013 1109   CL 110 06/09/2014 0450   CL 99 04/19/2013 1109   CO2 19 06/09/2014 0450   CO2 27 04/19/2013 1109   GLUCOSE 114* 06/09/2014 0450   GLUCOSE 115* 04/19/2013 1109   BUN 48* 06/09/2014 0450   BUN 11.0 04/19/2013 1109   CREATININE 2.37* 06/09/2014 0450   CREATININE 0.8 04/19/2013 1109   CALCIUM 8.3* 06/09/2014 0450   CALCIUM 7.5* 04/19/2013 1109   GFRNONAA 23* 06/09/2014 0450   GFRAA 27* 06/09/2014 0450   CBC    Component Value Date/Time   WBC 5.4 06/09/2014 0450   WBC 2.9* 04/19/2013 1109   RBC 2.55* 06/09/2014 0450   RBC 3.77 04/19/2013 1109   HGB 8.1* 06/09/2014 0450   HGB 12.3 04/19/2013 1109   HCT 24.9* 06/09/2014 0450   HCT 36.0 04/19/2013 1109   PLT 43* 06/09/2014 0450   PLT 39* 04/19/2013 1109   MCV 97.6 06/09/2014 0450   MCV 95.4 04/19/2013 1109   MCH 31.8 06/09/2014 0450   MCH 32.6 04/19/2013 1109    MCHC 32.5 06/09/2014 0450   MCHC 34.2 04/19/2013 1109   RDW 23.4* 06/09/2014 0450   RDW 15.8* 04/19/2013 1109   LYMPHSABS 1.2 05/30/2014 0500   LYMPHSABS 0.5* 04/19/2013 1109   MONOABS 0.4 05/30/2014 0500   MONOABS 0.2 04/19/2013 1109   EOSABS 0.3 05/30/2014 0500   EOSABS 0.1 04/19/2013 1109   BASOSABS 0.1 05/30/2014 0500   BASOSABS 0.0 04/19/2013 1109    Assessment/Plan:  1. AKI- Etiology Prerenal AKI Vs ATN Vs HRS. Cr improving, 2.37 today. Urine output reduced today- 850mls, on lasix 80mg  daily.Family opting not not take pt to [facility] for [facility] for liver [facility], want to pursue comfort measures preferably at home, level of care may require SNF placement, so pt might be discharged to SNF pending when bed is available.   2. Hypokalemia, likely from frusemide and poor Po intake, also on lactulose. Being repleted with IV Kcl 10 X4, and Oral - 30 meq, also Consider daily K suplimentation- 20 daily.  3. Hypernatremia- Resolved. Na  stable at 144. On regular thin liquids.   4. Metabolic acidosis- Bicarb- 19. Improving. Cont daily Bicarb- Oral 650 BID.   5. Hepatic encephalopathy- Improving, Continue lactulose, and rifaximin. Per GI  6. ABLA- Esophageal varices, on PPI, ceftriaxone, and rifaximin.  7. 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    8. Thrombocytopenia- 43 today, stable, multiple ecchymosis. Continue to avoid anticoagulants.   9. Anemia - hgb- 8.1 today, stable. Likely from ongoing slow Gi bleed from esophageal varices, with low platelets and also contributing chronic illness. Transfuse per primary.  Kennis CarinaEmokpae, [place], MD IMTS, PGY-2

## 2014-06-09 NOTE — Progress Notes (Signed)
Speech Language Pathology Treatment: Dysphagia  Patient Details Name: Dana Petersen MRN: [id number] DOB: 05-08-1966 Today's Date: 06/09/2014 Time: 0352-4818 SLP Time Calculation (min): 15 min  Assessment / Plan / Recommendation Clinical Impression  Dysphagia intervention during breakfast with husband present.  Pt. Appears irritated with frown; husband reports this initiated after conversation with hospice yesterday ("she won't talk to [place]").  Brief observation with oral leakage due to decrease labial seal with min verbal/tactile cues.  No indications of aspiration although she is at higher risk with overall decreased endurance and cognitive impairments.  Reviewed esophageal strategies with spouse.  Plan is for discharge with hospice care.  ST will sign off for now.    HPI HPI: 2 WF PMHx depression, seizure, HTN, cirrhosis of unclear etiology although does have prior history of polysubstance abuse including alcohol. Presented to [facility] ED 6/22 AM with hematemesis and decreased LOC. Recently discharged from [facility] after hospitalization for hepatic encephalopathy. Due to inability to protect airway she was intubated. GI was consulted and she was also placed on Octerotide infusion. EGD revealed large esophageal varices (requiring 7 bands) and portal gastropathy. She also underwent large volume paracentesis with 10,000 cc removed. She was eventually extubated and after treatment for encephalopathy she was able to awaken somewhat by 6/28. Pt evalauted on 6/30 and 7/1, recommended NPO by SLP. Orders then D/C/d by GI PA. Pts Panda came out and SLP reordered.    Pertinent Vitals WDL  SLP Plan  Discharge SLP treatment due to (comment);All goals met    Recommendations Diet recommendations: Dysphagia 1 (puree);Thin liquid Liquids provided via: Cup;No straw Medication Administration: Whole meds with puree Supervision: Staff to assist with self feeding;Full supervision/cueing for compensatory  strategies Compensations: Slow rate;Small sips/bites;Check for pocketing Postural Changes and/or Swallow Maneuvers: Seated upright 90 degrees              Oral Care Recommendations: Oral care BID Follow up Recommendations: Skilled Nursing facility Plan: Discharge SLP treatment due to (comment);All goals met    GO     Orbie Pyo Marvie Brevik M.Ed [employer] [phone]  06/09/2014

## 2014-06-09 NOTE — Progress Notes (Signed)
CRITICAL VALUE ALERT  Critical value received:  Potassium 2.8  Date of notification:  06/09/14  Time of notification:  0601  Critical value read back:Yes.    Nurse who received alert:  Gates RiggM. Shelley Cocke  MD notified (1st page):  [facility]  Time of first page:  0601  MD notified (2nd page):  Time of second page:  Responding MD:  Maren ReamerKaren Kirby, NP  Time MD responded:  [phone]

## 2014-06-09 NOTE — Progress Notes (Signed)
Progress Note from the Palliative Medicine Team at [facility]  Family has decided to go home with hospice and are preparing this house for equipment to be delivered and discharge tomorrow. She will keep foley catheter and flexiseal to allow her family to better care for her at home and for her comfort. Family is content with this decision and have no further questions/concerns. I have asked nursing to help teach family care for foley and flexiseal and to order flexiseal bags to be sent home with them for tomorrow. [name] exhibits no signs of pain or discomfort at this time.   Yong ChannelAlicia Venisha Boehning, NP Palliative Medicine Team Pager # [phone] (M-F 8a-5p) Team Phone # [phone] (Nights/Weekends)

## 2014-06-09 NOTE — Progress Notes (Signed)
[facility] TEAM 1 - Stepdown/ICU TEAM Progress Note  Dana Petersen ZOX:[id number] DOB: July 07, 1966 DOA: 05/22/2014 PCP: [name], MD  Admit HPI / Brief Narrative: 26 F w/ hx of Hx depression, seizure, HTN, and cirrhosis of unclear etiology. Presented to [facility] ED 6/22 AM with hematemesis and decreased LOC. Recently discharged from [facility] after hospitalization for hepatic encephalopathy. Due to inability to protect airway she was intubated. GI was consulted and she was also placed on Octerotide infusion. EGD revealed large esophageal varices (requiring 7 bands) and portal gastropathy. She also underwent large volume paracentesis with 10,000 cc removed. She was eventually extubated and after treatment for encephalopathy she was able to awaken somewhat by 6/28. She was subsequently made a DNR this admission.  HPI/Subjective: Awake - makes eye contact - does not speak to MD  Assessment/Plan:  Cryptogenic cirrhosis:  -Dr. Christella Hartigan following for GI- unable to locate any prior definitive hepatic work up so initiating this admission -7/8 [facility] contacted and pt accepted by the Hospitalist service for tx to eval if liver transplant candidate but family with multiple concerns-specifically previously informed by GI/Perry very unlikely she would qualify and [place] too far away for family to visit if placed in facility in that area -After lengthy d/w family per myself and the Palliative Team the family has decided that it would be in the pt's best interests to defer transplant evaluation and instead pursue comfort measures preferably in the pt's home -plan dc home with [facility] per Hospice 7/11 -continue Foley, rectal tube for comfort and hygiene and midline catheter for anticipated IV meds for comfort after discharge -resumed prior Xanax- not currently using narcs IP and since no respiratory symptoms or pain reports that would warrant use of narcs will defer initiation to the OP setting  Ascites    -albumin 2.8   Coagulopathy -currently no signs of active bleeding    Thrombocytopenia  -Continued severe thrombocytopenia - no evidence currently of bleeding - monitor closely  Esophageal varices and portal gastropathy  -initial dx in 2009 and felt to be 2/2 ETOH but family denies pt with prior alcoholism so ?? NASH vs auto immune?? - GI started evaluation 7/6 but at this point no indication to pursue results since focus now on palliation -due to GIB GI recommend continue Rocephin 1 g daily for SBP prophylaxis -dc upon discharge   Hepatic encephalopathy  -cont Rifaximin and Lactulose after discharge -ammonia normal on current tx -mentation improved but labile  Dysphagia -SLP eval with MBSS- rec D1 diet  Acute renal failure/hypernatremia  -Renal following and suspected pre renal etiology exacerbated by admission hypotension (10 L removed last paracentesis) -Na has normalized-still in positive balance 2/2 edema and ascites -cont Lasix (started by Nephro) -kvo D5W but can increase rate if Na increases  Upper GI bleed due to esophageal varices  -GI recommended repeat EGD around July 22 but may not pursue given current dc plans  Poor venous access -CL has been in place since 6/22 -IV Team to place midline cath   Acute respiratory failure with hypoxemia  -primarily intubated for airway protection  -CXR 6/29 unremarkable for PNA   History SEIZURE, GRAND MAL  -was due to Alprazolam withdrawal   HYPERTENSION  -controlled off meds   Acute blood loss anemia  -admission hgb was 12 and had been stable around 7 but slowly drifted down over several days-  -7/8 hgb down to 6.6 so transfused 1 unit PRBCs  DEPRESSIVE DISORDER, RCR, MODERATE  -Continue  Prozac 20 mg daily once swallow re eval  GERD   Substance abuse/Nicotine abuse  -has not used ETOH regularly for >10 years- was using prescribed BZDs pre admit   Sore throat  -Continue Cetacaine spray TID PRN  Code Status: DO  NOT RESUSCITATE  Family Communication: spoke w/ daughter at bedside  Disposition Plan/Expected LOS: [facility] and plans are to dc 7/11  Consultants: Dr. Yancey FlemingsJohn Perry (gastroenterology) Dr. Terrial RhodesJoseph Coladonato (nephrology) Palliative Medicine  Procedure/Significant Events: ETT6/22 >> 6/25, 6/25 >> 6/27  L IJ CVL 6/22 >>  7/1 echocardiogram - Left ventricle: mild LVH. LVEF= 60%.  - Left atrium: mildly dilated.  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 rate 16, height 5\' 4"  (1.626 m), weight 214 lb (97.07 kg), last menstrual period 05/25/2014, SpO2 100.00%.   Intake/Output Summary (Last 24 hours) at 06/09/14 1054 Last data filed at 06/09/14 0501  Gross per 24 hour  Intake 310.17 ml  Output    850 ml  Net -539.83 ml   Exam: General: Awake and non verbal  Lungs: Clear to auscultation bilaterally without wheezes or crackles, RA  Cardiovascular: Regular rate and rhythm without murmur gallop or rub normal S1 and S2 Abdomen: Nontender, distended w/ stable ascites, soft, bowel sounds positive, no rebound,no appreciable mass Musculoskeletal: No significant cyanosis, or clubbing of bilateral lower extremities, bilateral lower extremity 3+ c/w evolving anasarca  Data Reviewed: Basic Metabolic Panel:  Recent Labs Lab 06/03/14 0500 06/04/14 0400 06/05/14 0400 06/06/14 0400 06/07/14 0500 06/08/14 0230 06/09/14 0450  NA 154* 151* 148* 144 145 144 147  K 3.7 3.9 3.8 3.8 3.6* 3.7 2.8*  CL 121* 118* 116* 111 111 109 110  CO2 18* 19 19 15* 20 17* 19  GLUCOSE 157* 157* 151* 120* 114* 139* 114*  BUN  36* 41* 44* 46* 49* 49* 48*  CREATININE 2.79* 2.68* 2.58* 2.45* 2.54* 2.45* 2.37*  CALCIUM 8.1* 8.3* 8.1* 8.5 8.0* 8.4 8.3*  MG 1.9 1.8 1.9  --   --   --   --    Liver Function Tests:  Recent Labs Lab 06/05/14 0400 06/06/14 0400 06/07/14 0500 06/08/14 0230 06/09/14 0450  AST 113* 97* 76* 77* 66*  ALT 38* 33 31 32 30  ALKPHOS 431* 363* 333* 346* 326*  BILITOT 1.4* 1.8* 1.4* 1.7* 1.7*  PROT 6.4 6.5 6.3 6.6 6.6  ALBUMIN 2.8* 2.8* 2.7* 2.8* 2.8*    Recent Labs Lab 06/05/14 0415 06/06/14 0400 06/07/14 0500 06/08/14 0230 06/09/14 0450  AMMONIA 28 35 26 38 18   CBC:  Recent Labs Lab 06/05/14 0400 06/06/14 0400 06/07/14 0500 06/08/14 0230 06/09/14 0450  WBC 7.9 8.4 5.0 7.4 5.4  HGB 7.0* 7.3* 6.6* 8.4* 8.1*  HCT 21.9* 22.8* 21.5* 25.8* 24.9*  MCV 99.1 98.7 101.9* 97.0 97.6  PLT 28* 55* 39* 42* 43*   CBG:  Recent Labs Lab 06/08/14 0821 06/08/14 1220 06/08/14 1713 06/08/14 2118 06/09/14 0744  GLUCAP 107* 141* 124* 175* 105*   Studies:  Recent x-ray studies have been reviewed in detail by the Attending Physician  Scheduled Meds:  Scheduled Meds: . antiseptic oral rinse  15 mL Mouth Rinse QID  . calamine   Topical TID  . cefTRIAXone (ROCEPHIN)  IV  1 g Intravenous Q24H  . chlorhexidine  15 mL Mouth Rinse BID  . feeding supplement (GLUCERNA SHAKE)  237 mL Oral BID BM  . feeding supplement (RESOURCE BREEZE)  1 Container Oral Q24H  . furosemide  80 mg Intravenous Daily  . insulin aspart  0-15 Units Subcutaneous TID WC  . insulin aspart  0-5 Units Subcutaneous QHS  . lactulose  300 mL Rectal Daily   Or  . lactulose  20 g Oral Daily  . pantoprazole  40 mg Oral Daily  . potassium chloride  30 mEq Oral BID  . potassium chloride  10 mEq Intravenous Q1 Hr x 4  . rifaximin  550 mg Oral BID  . sodium bicarbonate  650 mg Oral BID  . sodium chloride  10 mL Intravenous Q12H  . sodium chloride  10-40 mL Intracatheter Q12H   Time spent on care of this patient:  25 mins  ELLIS,ALLISON L. , ANP  [facility] Office  [phone] Pager - [phone]  On-Call/Text Page:      [email or web address]      password [email or web address]  If 7PM-7AM, please contact night-coverage [email or web address] Password [email or web address] 06/09/2014, 10:54 AM   LOS: 18 days   I have personally examined this patient and reviewed the entire database. I have reviewed the above note, made any necessary editorial changes, and agree with its content.  Lonia Blood, MD [facility]

## 2014-06-09 NOTE — Progress Notes (Signed)
I have personally seen and examined this patient and agree with the assessment/plan as outlined above by Mariea ClontsEmokpae MD (PGY2). Plans noted for palliative care and possible DC soon. Recommend KDur 40mEq daily with chronic lasix/lactulose starting today. Will sign off for now, call with questions.  Averey Koning K.,MD 06/09/2014 10:44 AM

## 2014-06-09 NOTE — Progress Notes (Signed)
PT Cancellation Note  Patient Details Name: Dana Petersen MRN: [id number] DOB: Apr 15, 1966   Cancelled Treatment:    Reason Eval/Treat Not Completed: Medical issues which prohibited therapy. Potassium critically low at 2.8. Will continue to follow and check back if afternoon labs show appropriate Potassium value.    Ruthann CancerHamilton, Dana Petersen 06/09/2014, 9:31 AM  Ruthann CancerLaura Petersen, PT, DPT Acute Rehabilitation Services Pager: [phone]

## 2014-06-10 LAB — GLUCOSE, CAPILLARY
Glucose-Capillary: 97 mg/dL (ref 70–99)
Glucose-Capillary: 114 mg/dL — ABNORMAL HIGH (ref 70–99)

## 2014-06-10 LAB — COMPREHENSIVE METABOLIC PANEL WITH GFR
ALT: 31 U/L (ref 0–35)
AST: 65 U/L — ABNORMAL HIGH (ref 0–37)
Albumin: 2.9 g/dL — ABNORMAL LOW (ref 3.5–5.2)
Alkaline Phosphatase: 312 U/L — ABNORMAL HIGH (ref 39–117)
Anion gap: 18 — ABNORMAL HIGH (ref 5–15)
BUN: 46 mg/dL — ABNORMAL HIGH (ref 6–23)
CO2: 19 meq/L (ref 19–32)
Calcium: 8.2 mg/dL — ABNORMAL LOW (ref 8.4–10.5)
Chloride: 107 meq/L (ref 96–112)
Creatinine, Ser: 2.23 mg/dL — ABNORMAL HIGH (ref 0.50–1.10)
GFR calc Af Amer: 29 mL/min — ABNORMAL LOW
GFR calc non Af Amer: 25 mL/min — ABNORMAL LOW
Glucose, Bld: 102 mg/dL — ABNORMAL HIGH (ref 70–99)
Potassium: 2.9 meq/L — CL (ref 3.7–5.3)
Sodium: 144 meq/L (ref 137–147)
Total Bilirubin: 1.8 mg/dL — ABNORMAL HIGH (ref 0.3–1.2)
Total Protein: 6.7 g/dL (ref 6.0–8.3)

## 2014-06-10 MED ORDER — POTASSIUM CHLORIDE 10 MEQ/100ML IV SOLN
10.0000 meq | INTRAVENOUS | Status: AC
  Administered 2014-06-10 (×3): 10 meq via INTRAVENOUS
  Filled 2014-06-10 (×3): qty 100

## 2014-06-10 MED ORDER — FUROSEMIDE 80 MG PO TABS
80.0000 mg | ORAL_TABLET | Freq: Every day | ORAL | Status: DC
  Filled 2014-06-10: qty 1

## 2014-06-10 MED ORDER — CALAMINE EX LOTN: TOPICAL_LOTION | Freq: Three times a day (TID) | CUTANEOUS | Status: AC

## 2014-06-10 MED ORDER — MORPHINE SULFATE 2 MG/ML IJ SOLN: 1.0000 mg | INTRAMUSCULAR | Status: AC | PRN

## 2014-06-10 MED ORDER — SODIUM CHLORIDE 0.9 % IJ SOLN: 10.0000 mL | Freq: Two times a day (BID) | INTRAMUSCULAR | Status: AC

## 2014-06-10 MED ORDER — MORPHINE SULFATE 2 MG/ML IJ SOLN
1.0000 mg | INTRAMUSCULAR | Status: DC | PRN
  Filled 2014-06-10: qty 1

## 2014-06-10 MED ORDER — ONDANSETRON 4 MG PO TBDP: 4.0000 mg | ORAL_TABLET | Freq: Four times a day (QID) | ORAL | Status: AC | PRN

## 2014-06-10 MED ORDER — MORPHINE SULFATE (CONCENTRATE) 10 MG /0.5 ML PO SOLN
10.0000 mg | ORAL | Status: AC | PRN
Start: 2014-06-10 — End: ?

## 2014-06-10 MED ORDER — PANTOPRAZOLE SODIUM 40 MG PO TBEC: 40.0000 mg | DELAYED_RELEASE_TABLET | Freq: Every day | ORAL | Status: AC

## 2014-06-10 MED ORDER — POTASSIUM CHLORIDE 20 MEQ/15ML (10%) PO LIQD: 30.0000 meq | Freq: Two times a day (BID) | ORAL | Status: AC

## 2014-06-10 MED ORDER — RIFAXIMIN 550 MG PO TABS: 550.0000 mg | ORAL_TABLET | Freq: Two times a day (BID) | ORAL | Status: AC

## 2014-06-10 MED ORDER — LACTULOSE 10 GM/15ML PO SOLN: 20.0000 g | Freq: Every day | ORAL | Status: AC

## 2014-06-10 MED ORDER — SODIUM BICARBONATE 650 MG PO TABS: 650.0000 mg | ORAL_TABLET | Freq: Two times a day (BID) | ORAL | Status: AC

## 2014-06-10 MED ORDER — PANTOPRAZOLE SODIUM 40 MG PO TBEC: 40.0000 mg | DELAYED_RELEASE_TABLET | Freq: Every day | ORAL | Status: DC

## 2014-06-10 MED ORDER — FUROSEMIDE 80 MG PO TABS: 80.0000 mg | ORAL_TABLET | Freq: Every day | ORAL | Status: AC

## 2014-06-10 NOTE — Progress Notes (Signed)
2120 Patient vomited up medications tonight.Prn dose of zofran given to patient .Will continue to monitor.

## 2014-06-10 NOTE — Progress Notes (Signed)
MD responded, to call by HS RN regarding [name] potassium level of 2.9. T.O received.

## 2014-06-10 NOTE — Progress Notes (Signed)
CRITICAL VALUE ALERT  Critical value received:  Potassium 2.9  Date of notification:  06/10/2014  Time of notification:  0705  Critical value read back:yes  Nurse who received alert:  Jacklyn ShellAnne Laporche Martelle RN  MD notified (1st page):  Dr.Mcclung  Time of first page:  0705  MD notified (2nd page):Dr.Mcclung  Time of second page:0755  Responding MD:  none Time MD responded:  none

## 2014-06-10 NOTE — Clinical Social Work Note (Signed)
CSW made aware by RN patient ready for d/c home. Patient RN confirmed address with husband. CSW prepared d/c packet and placed in shadow chart. CSW met with husband and made him aware of transportation arrangement. Husband is agreeable. CSW to arrange transportation via [place]. No further needs. CSW signing off.   [place], [place] Weekend Clinical Social Worker [phone]

## 2014-06-10 NOTE — Progress Notes (Signed)
Patient refusing to try take medications that she vomited earlier.Patient husband at bedside.

## 2014-06-13 ENCOUNTER — Telehealth: Payer: Self-pay | Admitting: Family Medicine

## 2014-06-13 DIAGNOSIS — K766 Portal hypertension: Secondary | ICD-10-CM

## 2014-06-13 DIAGNOSIS — K7682 Hepatic encephalopathy: Secondary | ICD-10-CM

## 2014-06-13 DIAGNOSIS — R1310 Dysphagia, unspecified: Secondary | ICD-10-CM

## 2014-06-13 DIAGNOSIS — K7290 Hepatic failure, unspecified without coma: Secondary | ICD-10-CM

## 2014-06-13 DIAGNOSIS — K922 Gastrointestinal hemorrhage, unspecified: Secondary | ICD-10-CM

## 2014-06-13 DIAGNOSIS — D500 Iron deficiency anemia secondary to blood loss (chronic): Secondary | ICD-10-CM

## 2014-06-13 DIAGNOSIS — E46 Unspecified protein-calorie malnutrition: Secondary | ICD-10-CM

## 2014-06-13 DIAGNOSIS — K7460 Unspecified cirrhosis of liver: Secondary | ICD-10-CM

## 2014-06-13 DIAGNOSIS — N184 Chronic kidney disease, stage 4 (severe): Secondary | ICD-10-CM

## 2014-06-13 NOTE — Telephone Encounter (Signed)
Patti from hospice called to say that this pt no longer has a pick line // ck

## 2014-07-25 ENCOUNTER — Telehealth: Payer: Self-pay | Admitting: Family Medicine

## 2014-07-25 NOTE — Telephone Encounter (Signed)
Hospice calling to advise pt has expired today at 1:22am.

## 2014-08-01 DEATH — deceased

## 2014-08-03 ENCOUNTER — Encounter: Payer: Self-pay | Admitting: Family Medicine

## 2015-04-03 ENCOUNTER — Encounter: Payer: Self-pay | Admitting: Internal Medicine

## 2015-07-14 IMAGING — CR DG CHEST 1V PORT
1 series · 1 of 1 positions shown · non-contrast
Comparison: 05/22/2014

CLINICAL DATA: GI bleed. Altered mental status. Check line
placement.

EXAM:
PORTABLE CHEST - 1 VIEW

[AP]
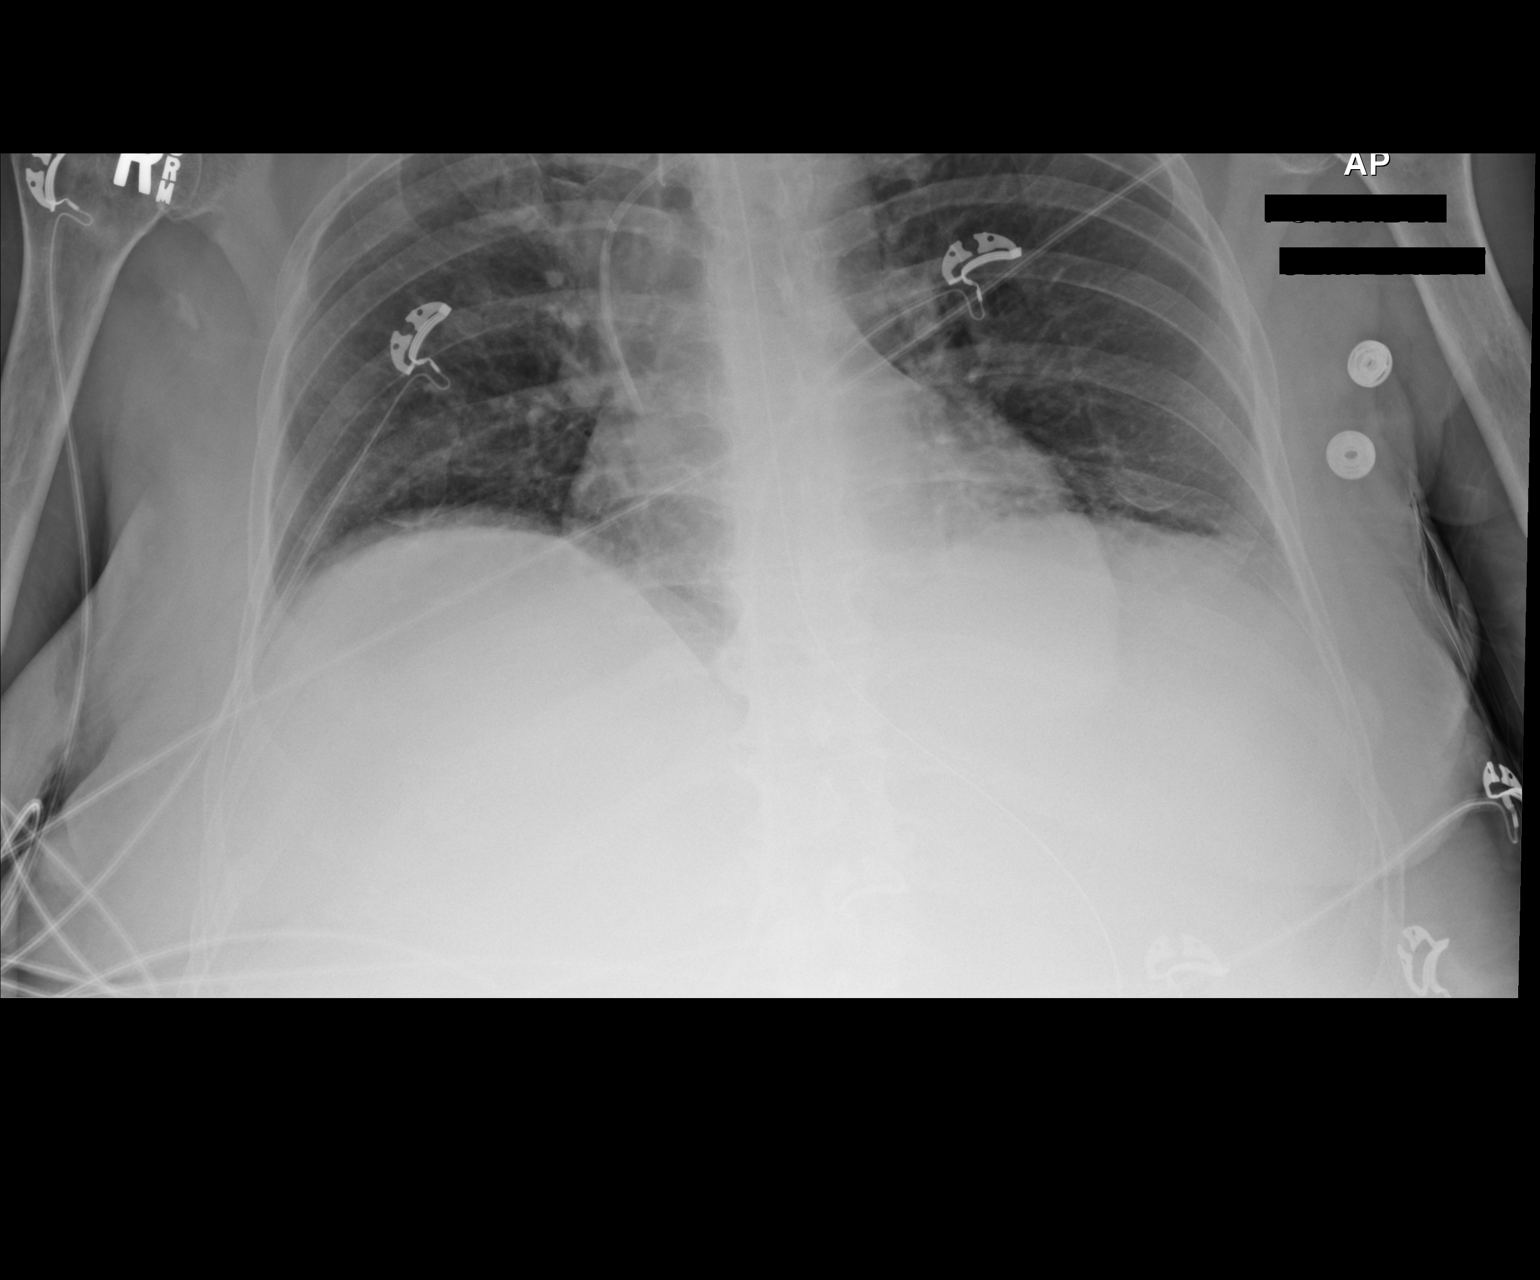

[1 of 1 positions shown; findings below may reference images not displayed]

FINDINGS: Left central line is in place with the tip at the cavoatrial
junction. Endotracheal tube has been retracted, now approximately 3
cm above the carina. NG tube is in the stomach.

Very low lung volumes with bibasilar atelectasis. Heart is normal
size. No visible effusions or acute bony abnormality.
IMPRESSION: Left central line tip at the cavoatrial junction. No pneumothorax.
Endotracheal tube 3 cm above the carina.

Low lung volumes, bibasilar atelectasis.

## 2015-07-14 IMAGING — US US RENAL PORT
1 series · 14 of 25 positions shown · non-contrast
Comparison: Ultrasound for paracentesis dated 04/20/2014.

CLINICAL DATA: Renal insufficiency 8

EXAM:
RENAL/URINARY TRACT ULTRASOUND COMPLETE

[Series 1: us renal port · 0.32mm/px · 14 of 27 slices shown]
[im 1/27]
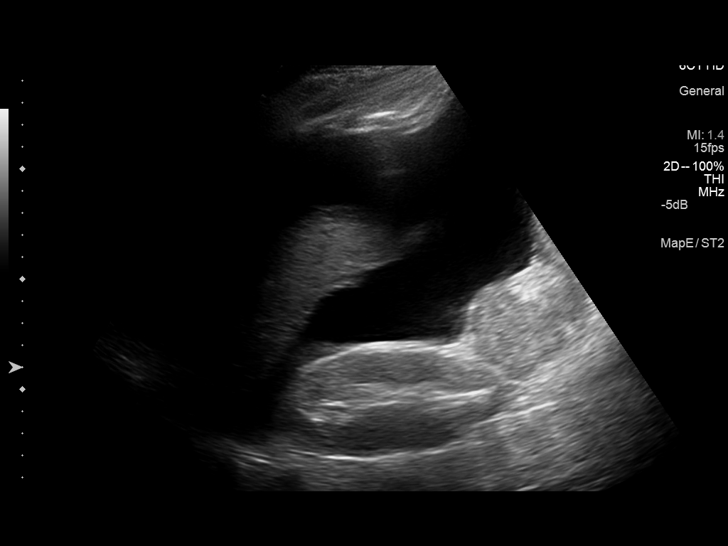
[im 3/27]
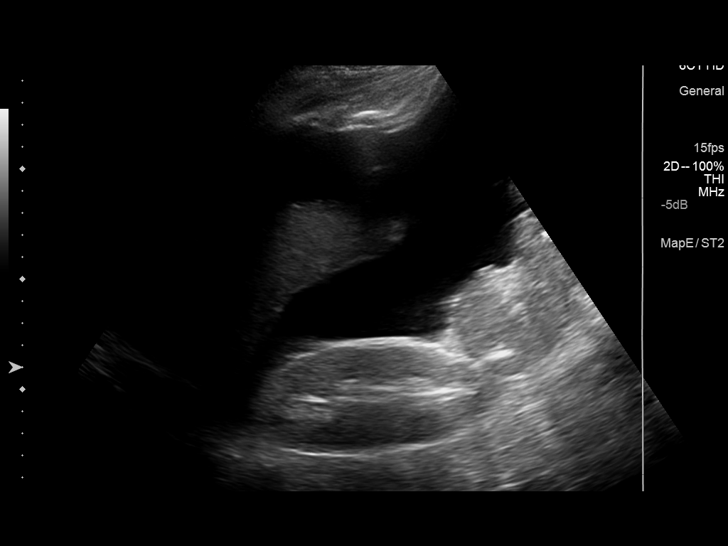
[im 5/27]
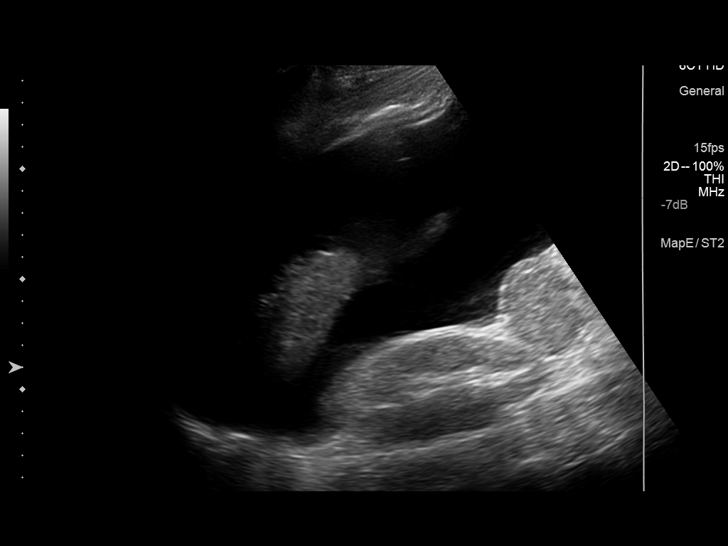
[im 7/27]
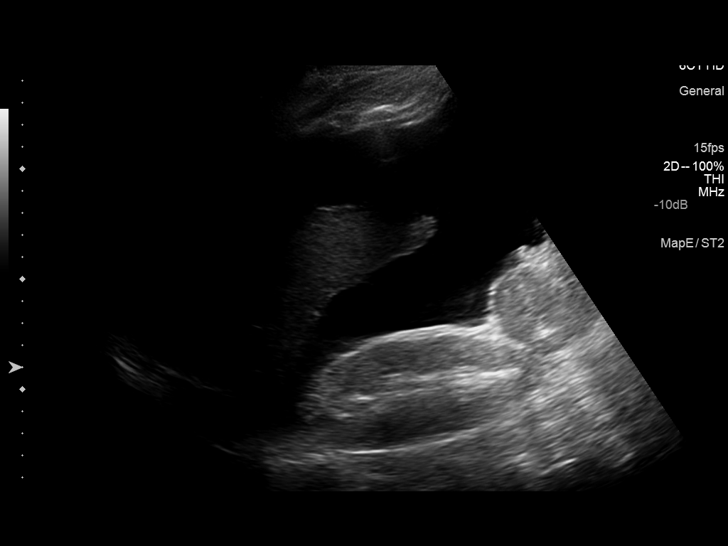
[im 9/27]
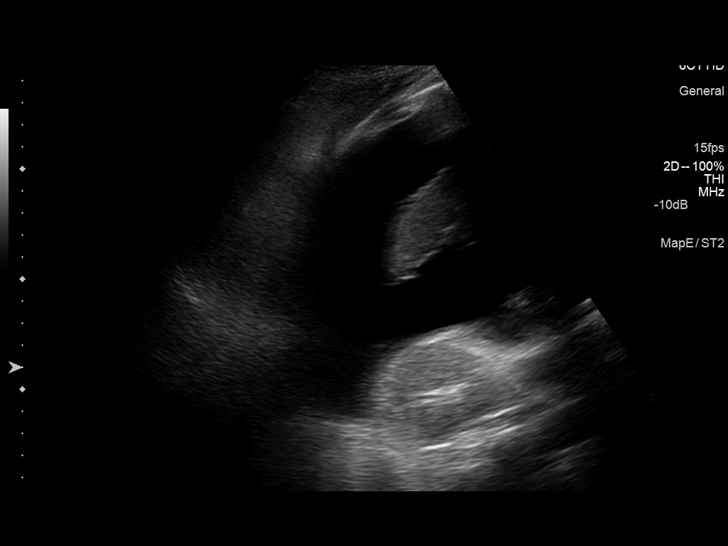
[im 10/27]
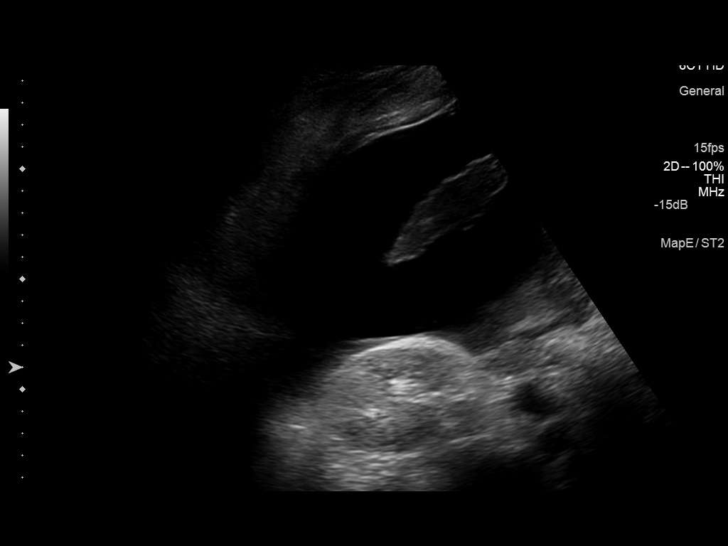
[im 12/27]
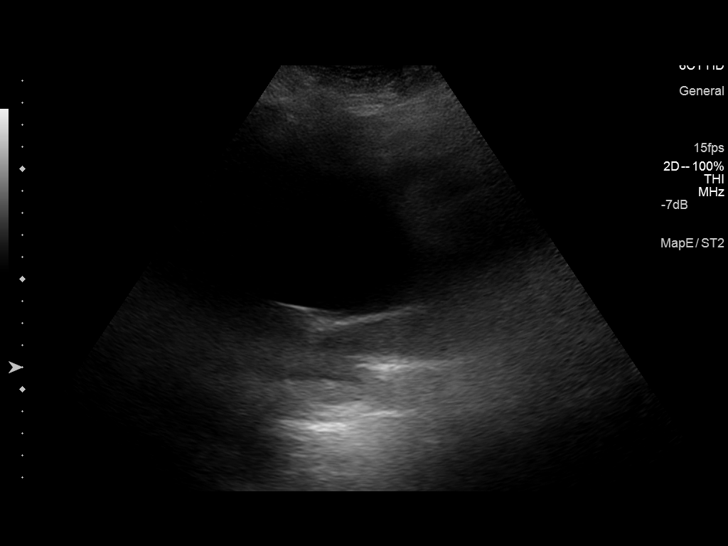
[im 15/27]
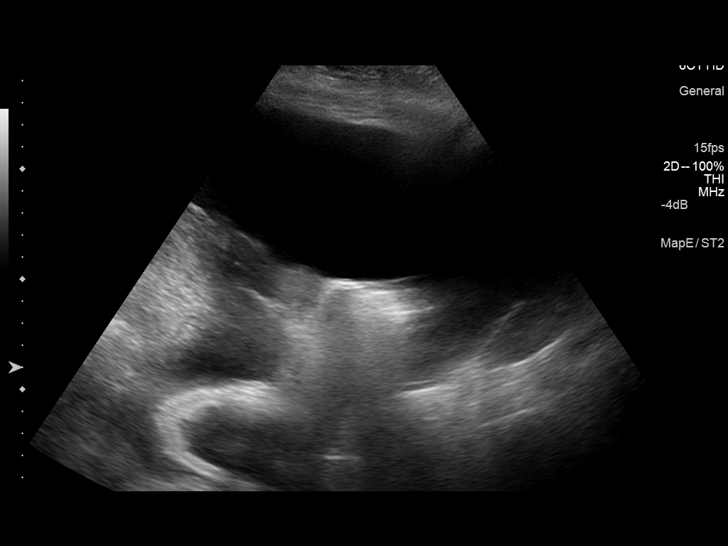
[im 17/27]
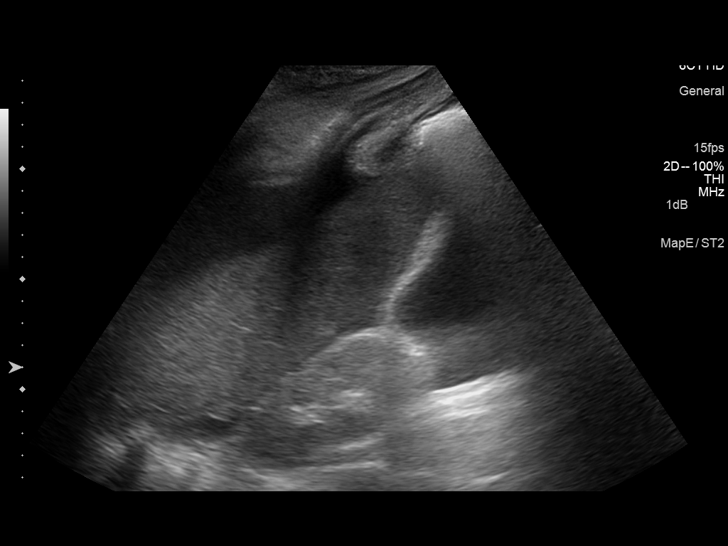
[im 18/27]
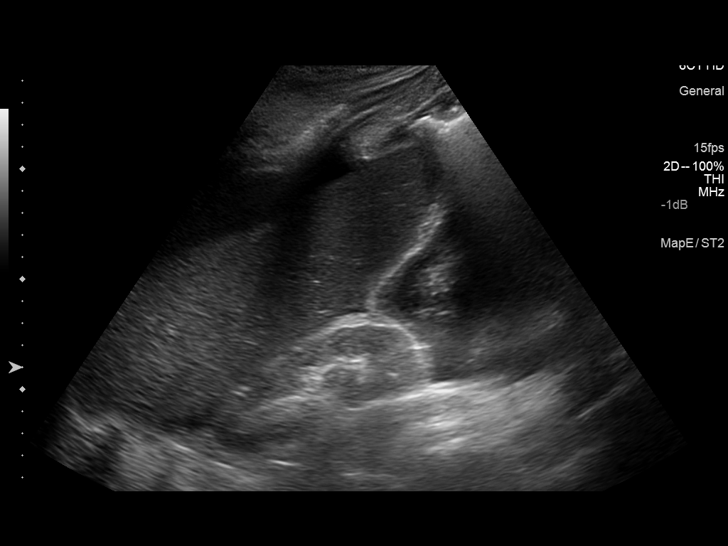
[im 20/27]
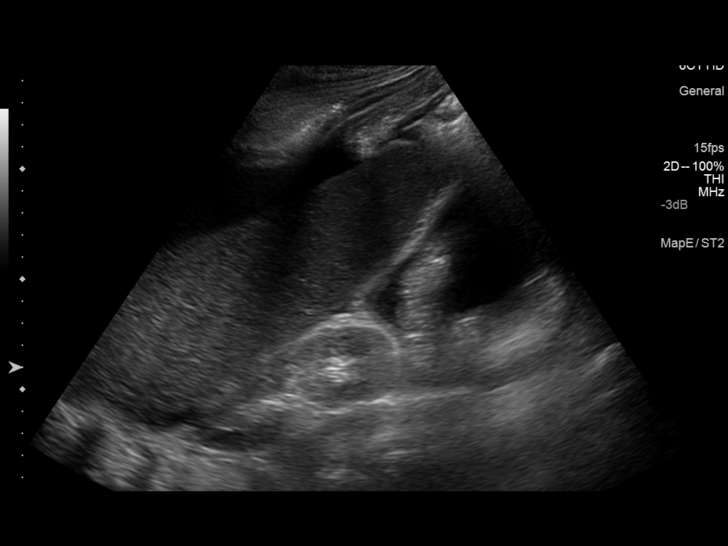
[im 22/27]
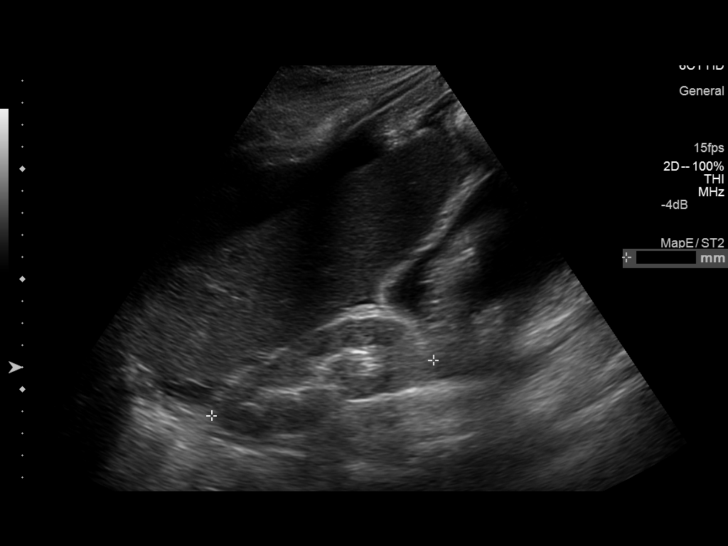
[im 24/27]
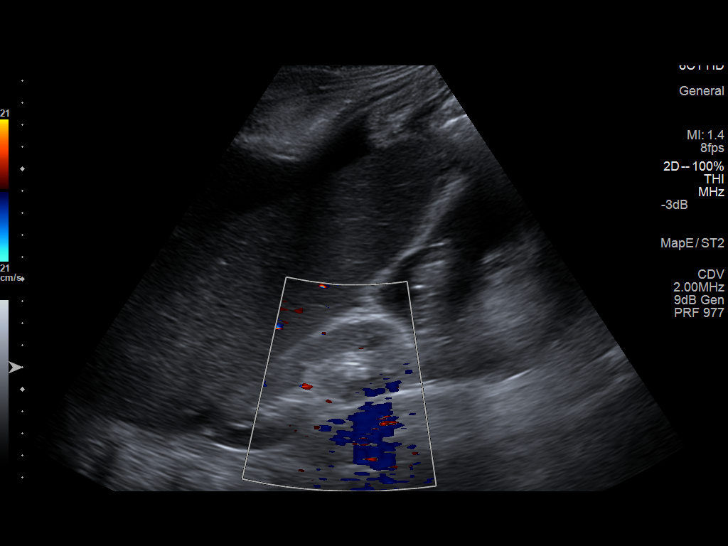
[im 27/27]
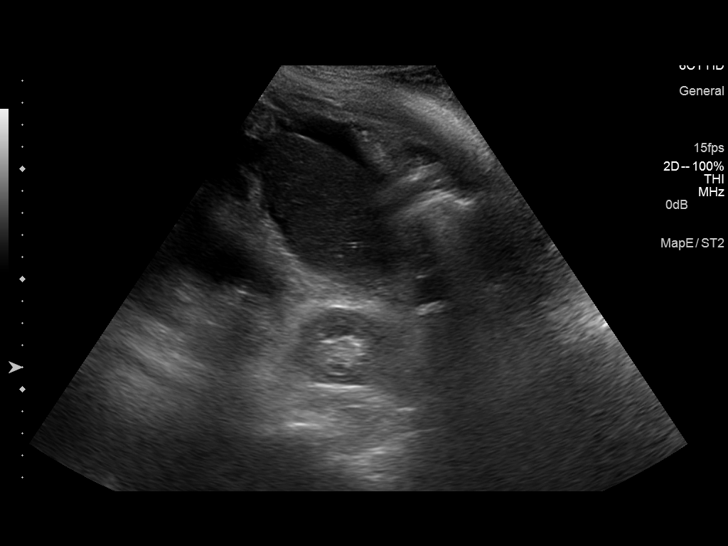

[14 of 25 positions shown; findings below may reference images not displayed]

FINDINGS: Right Kidney:

Length: 11.9 cm. Echogenicity within normal limits. No mass or
hydronephrosis visualized.

Left Kidney:

Length: 10.4 cm. Echogenicity within normal limits. No mass or
hydronephrosis visualized.

Bladder:

Not visualized.

Note: Intra abdominal ascites is evident to a moderate -large
degree.
IMPRESSION: No evidence for hydronephrosis.

Moderate volume ascites.

## 2015-07-14 IMAGING — CR DG CHEST 1V PORT
1 series · 1 of 1 positions shown · non-contrast
Comparison: [DATE]

CLINICAL DATA: Altered mental status.  Vomiting blood.

EXAM:
PORTABLE CHEST - 1 VIEW

[AP]
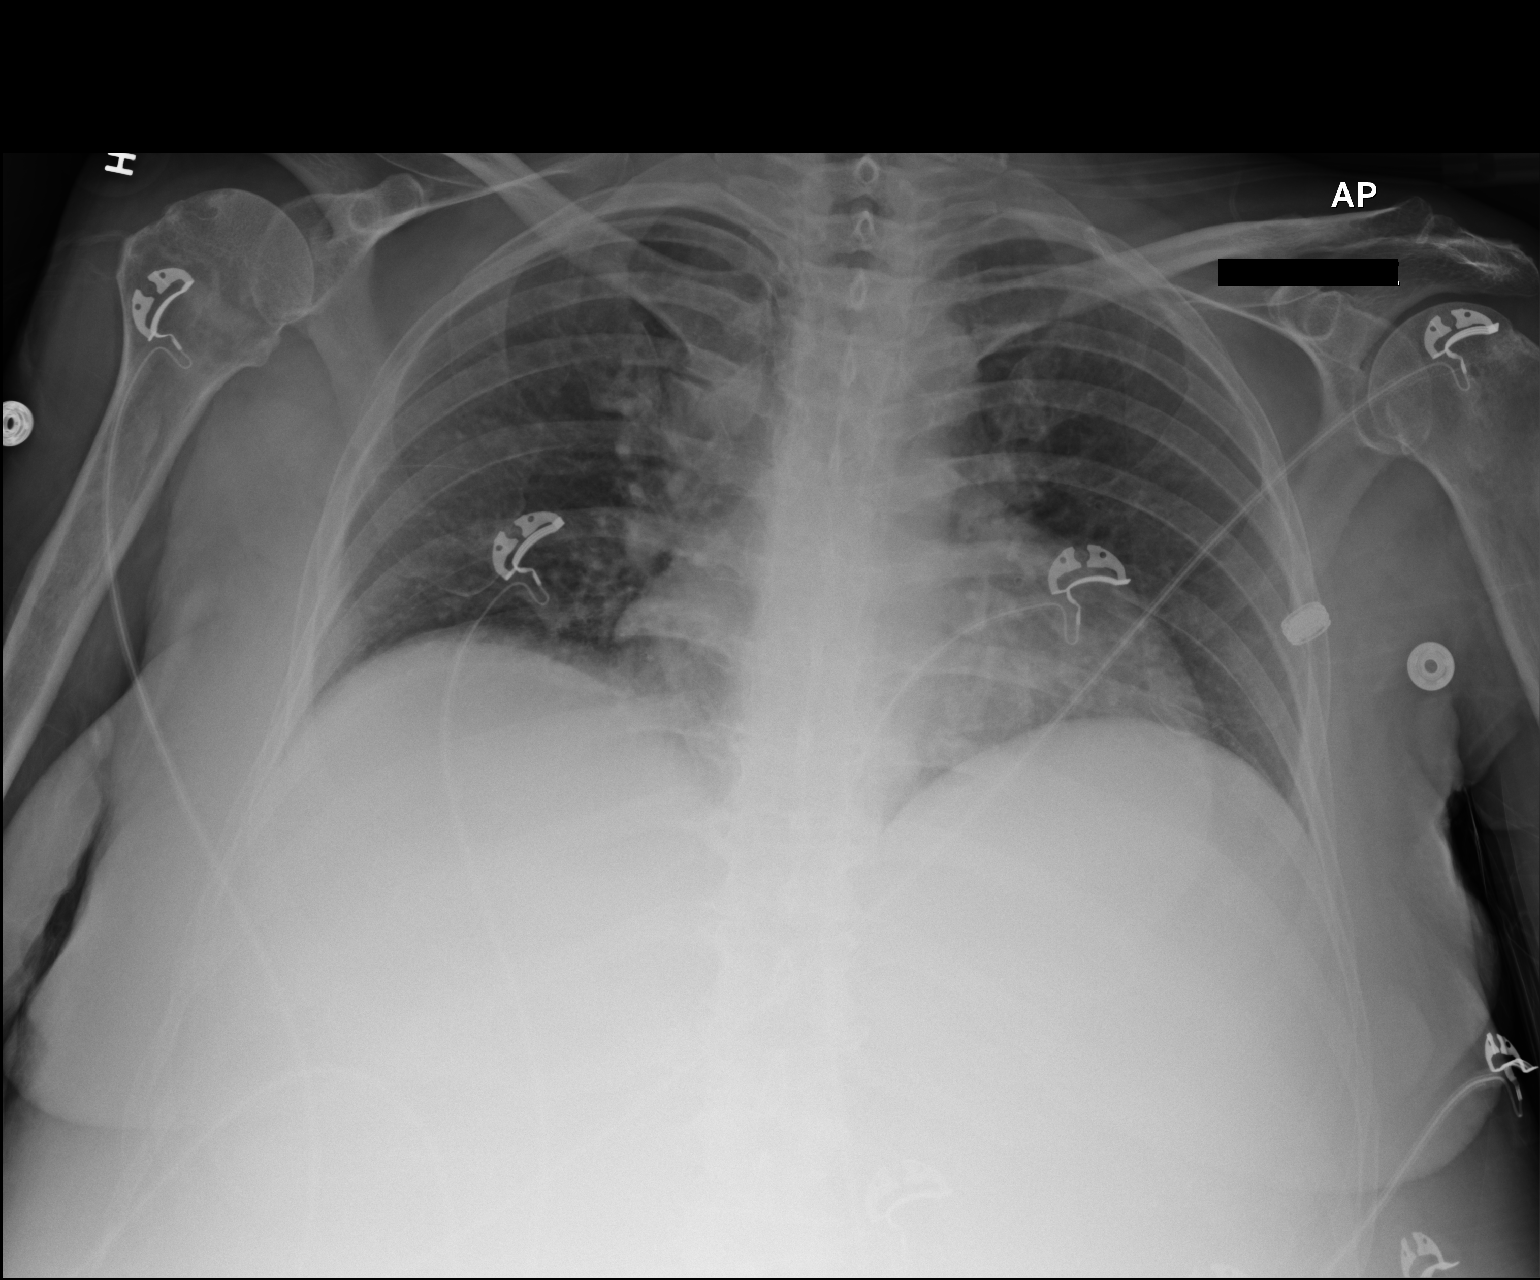

[1 of 1 positions shown; findings below may reference images not displayed]

FINDINGS: Shallow inspiration. Normal heart size and pulmonary vascularity.
Lungs appear clear and expanded given degree of inspiration. No
focal airspace disease. No blunting of costophrenic angles. Similar
appearance to prior study.
IMPRESSION: Shallow inspiration.  No evidence of active pulmonary disease.

## 2015-07-16 IMAGING — CR DG CHEST 1V PORT
1 series · 1 of 1 positions shown · non-contrast
Comparison: 05/22/2014

CLINICAL DATA: Check endotracheal tube

EXAM:
PORTABLE CHEST - 1 VIEW

[AP]
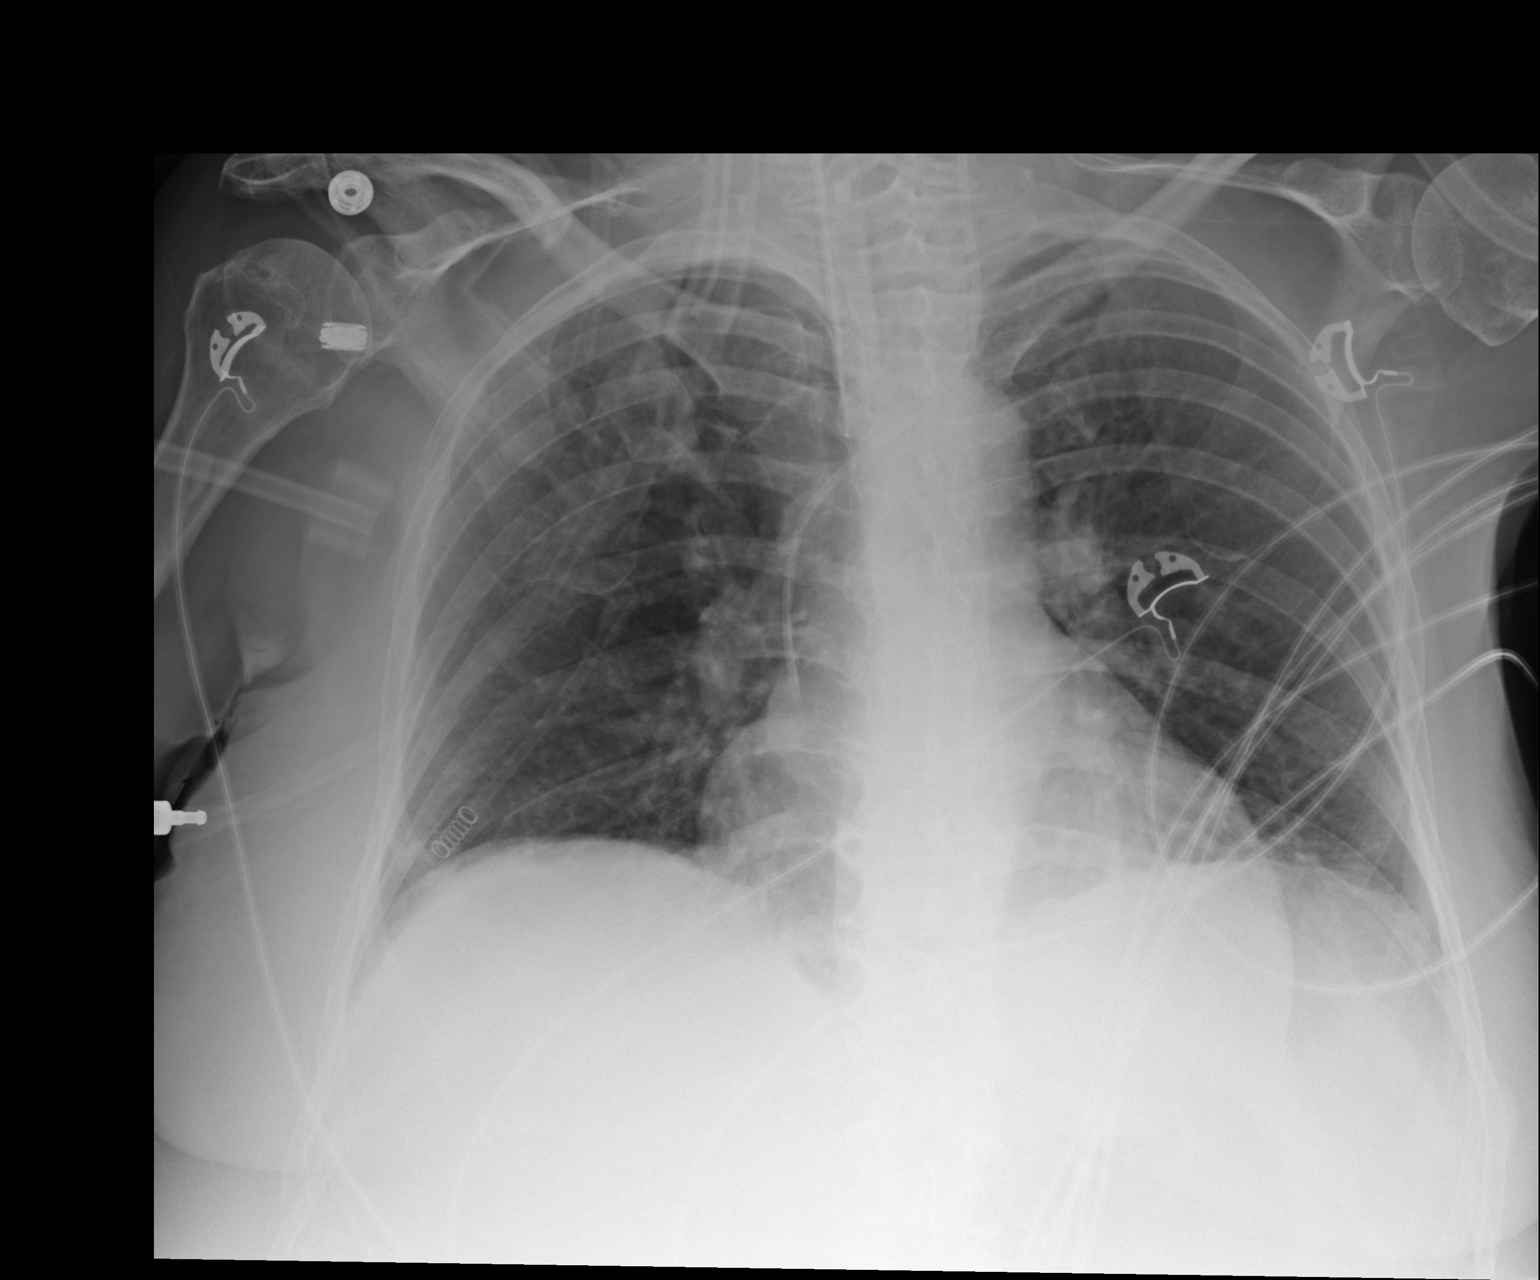

[1 of 1 positions shown; findings below may reference images not displayed]

FINDINGS: The cardiac shadow is stable. An endotracheal tube is noted 2.6 cm
above the carina and stable in appearance. A left jugular central
line is noted at the cavoatrial junction. No pneumothorax is seen.
The lungs are well aerated bilaterally. A focal infiltrate or
sizable effusion is seen.
IMPRESSION: Tubes and lines as described above.  No acute abnormality noted.

## 2015-07-21 IMAGING — CR DG CHEST 1V PORT
1 series · 1 of 1 positions shown · non-contrast
Comparison: Single view of the chest 05/28/2014 and 05/26/2014.

CLINICAL DATA: Acute respiratory failure. Status post extubation
05/27/2014.

EXAM:
PORTABLE CHEST - 1 VIEW

[AP]
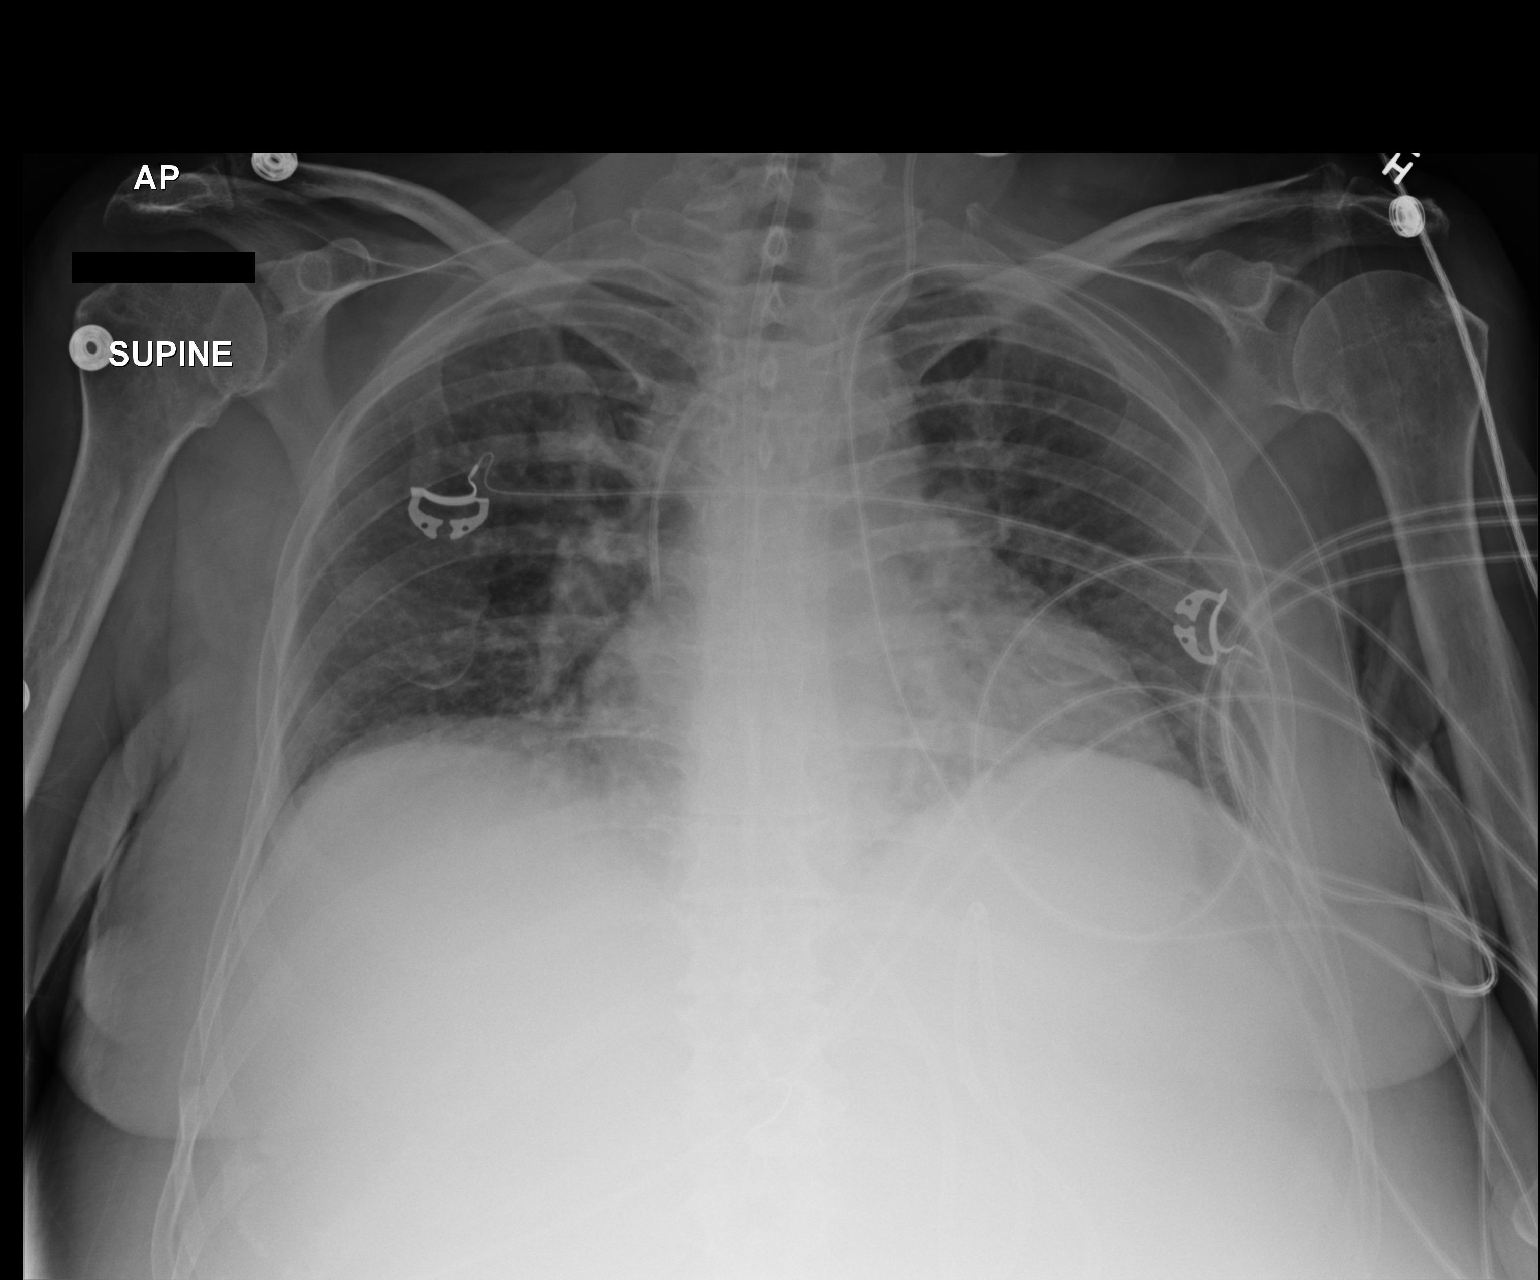

[1 of 1 positions shown; findings below may reference images not displayed]

FINDINGS: Feeding tube and left IJ catheter remain in place, unchanged. Lung
volumes are low but the lungs are clear. Heart size is normal. There
is no pneumothorax or pleural effusion.
IMPRESSION: No acute finding in a low volume chest.
